# Patient Record
Sex: Male | Born: 1950 | Race: White | Hispanic: No | Marital: Married | Smoking: Never smoker
Health system: Southern US, Community
[De-identification: ages and names within clinical notes are randomized; demographics above are authoritative.]

## PROBLEM LIST (undated history)

## (undated) DIAGNOSIS — G4730 Sleep apnea, unspecified: Secondary | ICD-10-CM

## (undated) DIAGNOSIS — Z87442 Personal history of urinary calculi: Secondary | ICD-10-CM

## (undated) DIAGNOSIS — Q6211 Congenital occlusion of ureteropelvic junction: Secondary | ICD-10-CM

## (undated) DIAGNOSIS — F419 Anxiety disorder, unspecified: Secondary | ICD-10-CM

## (undated) DIAGNOSIS — E119 Type 2 diabetes mellitus without complications: Secondary | ICD-10-CM

## (undated) DIAGNOSIS — M1990 Unspecified osteoarthritis, unspecified site: Secondary | ICD-10-CM

## (undated) DIAGNOSIS — D751 Secondary polycythemia: Secondary | ICD-10-CM

## (undated) HISTORY — DX: Secondary polycythemia: D75.1

## (undated) HISTORY — DX: Unspecified osteoarthritis, unspecified site: M19.90

## (undated) HISTORY — PX: BLADDER STONE REMOVAL: SHX568

## (undated) HISTORY — PX: OTHER SURGICAL HISTORY: SHX169

---

## 2004-02-13 ENCOUNTER — Other Ambulatory Visit: Payer: Self-pay

## 2004-09-26 ENCOUNTER — Ambulatory Visit: Payer: Self-pay | Admitting: Internal Medicine

## 2004-10-27 ENCOUNTER — Ambulatory Visit: Payer: Self-pay | Admitting: Internal Medicine

## 2004-11-26 ENCOUNTER — Ambulatory Visit: Payer: Self-pay | Admitting: Internal Medicine

## 2004-12-27 ENCOUNTER — Ambulatory Visit: Payer: Self-pay | Admitting: Internal Medicine

## 2005-01-27 ENCOUNTER — Ambulatory Visit: Payer: Self-pay | Admitting: Internal Medicine

## 2005-02-24 ENCOUNTER — Ambulatory Visit: Payer: Self-pay | Admitting: Internal Medicine

## 2005-03-27 ENCOUNTER — Ambulatory Visit: Payer: Self-pay | Admitting: Internal Medicine

## 2005-04-26 ENCOUNTER — Ambulatory Visit: Payer: Self-pay | Admitting: Internal Medicine

## 2005-06-01 ENCOUNTER — Ambulatory Visit: Payer: Self-pay | Admitting: Internal Medicine

## 2005-06-26 ENCOUNTER — Ambulatory Visit: Payer: Self-pay | Admitting: Internal Medicine

## 2005-07-27 ENCOUNTER — Ambulatory Visit: Payer: Self-pay | Admitting: Internal Medicine

## 2005-08-27 ENCOUNTER — Ambulatory Visit: Payer: Self-pay | Admitting: Internal Medicine

## 2005-10-05 ENCOUNTER — Ambulatory Visit: Payer: Self-pay | Admitting: Internal Medicine

## 2005-10-27 ENCOUNTER — Ambulatory Visit: Payer: Self-pay | Admitting: Internal Medicine

## 2005-11-26 ENCOUNTER — Ambulatory Visit: Payer: Self-pay | Admitting: Internal Medicine

## 2005-12-28 ENCOUNTER — Ambulatory Visit: Payer: Self-pay | Admitting: Internal Medicine

## 2006-01-27 ENCOUNTER — Ambulatory Visit: Payer: Self-pay | Admitting: Internal Medicine

## 2006-02-24 ENCOUNTER — Ambulatory Visit: Payer: Self-pay | Admitting: Internal Medicine

## 2006-03-27 ENCOUNTER — Ambulatory Visit: Payer: Self-pay | Admitting: Internal Medicine

## 2006-06-14 ENCOUNTER — Ambulatory Visit: Payer: Self-pay | Admitting: Internal Medicine

## 2006-06-26 ENCOUNTER — Ambulatory Visit: Payer: Self-pay | Admitting: Internal Medicine

## 2006-07-27 ENCOUNTER — Ambulatory Visit: Payer: Self-pay | Admitting: Internal Medicine

## 2006-08-27 ENCOUNTER — Ambulatory Visit: Payer: Self-pay | Admitting: Internal Medicine

## 2006-10-04 ENCOUNTER — Ambulatory Visit: Payer: Self-pay | Admitting: Internal Medicine

## 2006-10-27 ENCOUNTER — Ambulatory Visit: Payer: Self-pay | Admitting: Internal Medicine

## 2006-11-29 ENCOUNTER — Ambulatory Visit: Payer: Self-pay | Admitting: Internal Medicine

## 2006-12-27 ENCOUNTER — Ambulatory Visit: Payer: Self-pay | Admitting: Internal Medicine

## 2007-01-27 ENCOUNTER — Ambulatory Visit: Payer: Self-pay | Admitting: Internal Medicine

## 2007-03-21 ENCOUNTER — Ambulatory Visit: Payer: Self-pay | Admitting: Internal Medicine

## 2007-03-22 ENCOUNTER — Ambulatory Visit: Payer: Self-pay | Admitting: Internal Medicine

## 2007-03-28 ENCOUNTER — Ambulatory Visit: Payer: Self-pay | Admitting: Internal Medicine

## 2007-04-27 ENCOUNTER — Ambulatory Visit: Payer: Self-pay | Admitting: Internal Medicine

## 2007-05-28 ENCOUNTER — Ambulatory Visit: Payer: Self-pay | Admitting: Internal Medicine

## 2007-06-27 ENCOUNTER — Ambulatory Visit: Payer: Self-pay | Admitting: Internal Medicine

## 2007-07-28 ENCOUNTER — Ambulatory Visit: Payer: Self-pay | Admitting: Internal Medicine

## 2007-08-28 ENCOUNTER — Ambulatory Visit: Payer: Self-pay | Admitting: Internal Medicine

## 2007-09-27 ENCOUNTER — Ambulatory Visit: Payer: Self-pay | Admitting: Internal Medicine

## 2007-10-28 ENCOUNTER — Ambulatory Visit: Payer: Self-pay | Admitting: Internal Medicine

## 2007-11-27 ENCOUNTER — Ambulatory Visit: Payer: Self-pay | Admitting: Internal Medicine

## 2007-12-28 ENCOUNTER — Ambulatory Visit: Payer: Self-pay | Admitting: Internal Medicine

## 2008-01-28 ENCOUNTER — Ambulatory Visit: Payer: Self-pay | Admitting: Internal Medicine

## 2008-02-25 ENCOUNTER — Ambulatory Visit: Payer: Self-pay | Admitting: Internal Medicine

## 2008-03-27 ENCOUNTER — Ambulatory Visit: Payer: Self-pay | Admitting: Internal Medicine

## 2008-04-26 ENCOUNTER — Ambulatory Visit: Payer: Self-pay | Admitting: Internal Medicine

## 2008-05-27 ENCOUNTER — Ambulatory Visit: Payer: Self-pay | Admitting: Internal Medicine

## 2008-06-26 ENCOUNTER — Ambulatory Visit: Payer: Self-pay | Admitting: Internal Medicine

## 2008-07-27 ENCOUNTER — Ambulatory Visit: Payer: Self-pay | Admitting: Internal Medicine

## 2008-09-03 ENCOUNTER — Ambulatory Visit: Payer: Self-pay | Admitting: Internal Medicine

## 2008-09-26 ENCOUNTER — Ambulatory Visit: Payer: Self-pay | Admitting: Internal Medicine

## 2008-10-29 ENCOUNTER — Ambulatory Visit: Payer: Self-pay | Admitting: Internal Medicine

## 2008-11-26 ENCOUNTER — Ambulatory Visit: Payer: Self-pay | Admitting: Internal Medicine

## 2008-12-27 ENCOUNTER — Ambulatory Visit: Payer: Self-pay | Admitting: Internal Medicine

## 2008-12-31 ENCOUNTER — Ambulatory Visit: Payer: Self-pay | Admitting: Internal Medicine

## 2009-01-27 ENCOUNTER — Ambulatory Visit: Payer: Self-pay | Admitting: Internal Medicine

## 2009-02-24 ENCOUNTER — Ambulatory Visit: Payer: Self-pay | Admitting: Internal Medicine

## 2009-04-15 ENCOUNTER — Ambulatory Visit: Payer: Self-pay | Admitting: Internal Medicine

## 2009-04-26 ENCOUNTER — Ambulatory Visit: Payer: Self-pay | Admitting: Internal Medicine

## 2009-06-10 ENCOUNTER — Ambulatory Visit: Payer: Self-pay | Admitting: Internal Medicine

## 2009-06-26 ENCOUNTER — Ambulatory Visit: Payer: Self-pay | Admitting: Internal Medicine

## 2009-08-05 ENCOUNTER — Ambulatory Visit: Payer: Self-pay | Admitting: Internal Medicine

## 2009-08-27 ENCOUNTER — Ambulatory Visit: Payer: Self-pay | Admitting: Internal Medicine

## 2009-09-26 ENCOUNTER — Ambulatory Visit: Payer: Self-pay | Admitting: Internal Medicine

## 2009-09-30 ENCOUNTER — Ambulatory Visit: Payer: Self-pay | Admitting: Internal Medicine

## 2009-10-27 ENCOUNTER — Ambulatory Visit: Payer: Self-pay | Admitting: Internal Medicine

## 2009-11-26 ENCOUNTER — Ambulatory Visit: Payer: Self-pay | Admitting: Internal Medicine

## 2010-01-20 ENCOUNTER — Ambulatory Visit: Payer: Self-pay | Admitting: Internal Medicine

## 2010-01-27 ENCOUNTER — Ambulatory Visit: Payer: Self-pay | Admitting: Internal Medicine

## 2010-03-17 ENCOUNTER — Ambulatory Visit: Payer: Self-pay | Admitting: Internal Medicine

## 2010-03-27 ENCOUNTER — Ambulatory Visit: Payer: Self-pay | Admitting: Internal Medicine

## 2010-04-26 ENCOUNTER — Ambulatory Visit: Payer: Self-pay | Admitting: Internal Medicine

## 2010-05-12 ENCOUNTER — Ambulatory Visit: Payer: Self-pay | Admitting: Internal Medicine

## 2010-05-27 ENCOUNTER — Ambulatory Visit: Payer: Self-pay | Admitting: Internal Medicine

## 2010-07-07 ENCOUNTER — Ambulatory Visit: Payer: Self-pay | Admitting: Internal Medicine

## 2010-07-27 ENCOUNTER — Ambulatory Visit: Payer: Self-pay | Admitting: Internal Medicine

## 2010-08-20 ENCOUNTER — Ambulatory Visit: Payer: Self-pay | Admitting: Urology

## 2010-09-08 ENCOUNTER — Ambulatory Visit: Payer: Self-pay | Admitting: Internal Medicine

## 2010-09-14 ENCOUNTER — Ambulatory Visit: Payer: Self-pay | Admitting: Urology

## 2010-09-26 ENCOUNTER — Ambulatory Visit: Payer: Self-pay | Admitting: Internal Medicine

## 2010-11-03 ENCOUNTER — Ambulatory Visit: Payer: Self-pay | Admitting: Internal Medicine

## 2010-11-26 ENCOUNTER — Ambulatory Visit: Payer: Self-pay | Admitting: Internal Medicine

## 2011-01-01 ENCOUNTER — Ambulatory Visit: Payer: Self-pay | Admitting: Internal Medicine

## 2011-01-27 ENCOUNTER — Ambulatory Visit: Payer: Self-pay | Admitting: Internal Medicine

## 2011-02-26 ENCOUNTER — Ambulatory Visit: Payer: Self-pay | Admitting: Internal Medicine

## 2011-03-28 ENCOUNTER — Ambulatory Visit: Payer: Self-pay | Admitting: Internal Medicine

## 2011-04-27 ENCOUNTER — Ambulatory Visit: Payer: Self-pay | Admitting: Internal Medicine

## 2011-06-18 ENCOUNTER — Ambulatory Visit: Payer: Self-pay | Admitting: Internal Medicine

## 2011-06-27 ENCOUNTER — Ambulatory Visit: Payer: Self-pay | Admitting: Internal Medicine

## 2011-09-03 ENCOUNTER — Ambulatory Visit: Payer: Self-pay | Admitting: Internal Medicine

## 2011-09-27 ENCOUNTER — Ambulatory Visit: Payer: Self-pay | Admitting: Internal Medicine

## 2011-10-29 ENCOUNTER — Ambulatory Visit: Payer: Self-pay | Admitting: Internal Medicine

## 2011-11-27 ENCOUNTER — Ambulatory Visit: Payer: Self-pay | Admitting: Internal Medicine

## 2011-12-28 ENCOUNTER — Ambulatory Visit: Payer: Self-pay | Admitting: Internal Medicine

## 2012-02-21 ENCOUNTER — Ambulatory Visit: Payer: Self-pay | Admitting: Internal Medicine

## 2012-02-21 LAB — FERRITIN: Ferritin (ARMC): 20 ng/mL (ref 8–388)

## 2012-02-21 LAB — IRON AND TIBC
Iron Saturation: 12 %
Iron: 53 ug/dL — ABNORMAL LOW (ref 65–175)

## 2012-02-21 LAB — CANCER CENTER HEMATOCRIT: HCT: 49.6 % (ref 40.0–52.0)

## 2012-02-25 ENCOUNTER — Ambulatory Visit: Payer: Self-pay | Admitting: Internal Medicine

## 2012-04-17 ENCOUNTER — Ambulatory Visit: Payer: Self-pay | Admitting: Internal Medicine

## 2012-04-17 LAB — CANCER CENTER HEMATOCRIT: HCT: 48.8 % (ref 40.0–52.0)

## 2012-04-26 ENCOUNTER — Ambulatory Visit: Payer: Self-pay | Admitting: Internal Medicine

## 2012-06-07 ENCOUNTER — Ambulatory Visit: Payer: Self-pay | Admitting: Internal Medicine

## 2012-06-07 LAB — CBC CANCER CENTER
Basophil %: 0.5 %
HCT: 48.5 % (ref 40.0–52.0)
HGB: 15.6 g/dL (ref 13.0–18.0)
MCH: 24.2 pg — ABNORMAL LOW (ref 26.0–34.0)
MCV: 76 fL — ABNORMAL LOW (ref 80–100)
Monocyte #: 0.8 x10 3/mm (ref 0.2–1.0)
Neutrophil #: 5.5 x10 3/mm (ref 1.4–6.5)
RBC: 6.43 10*6/uL — ABNORMAL HIGH (ref 4.40–5.90)

## 2012-06-26 ENCOUNTER — Ambulatory Visit: Payer: Self-pay | Admitting: Internal Medicine

## 2012-08-02 ENCOUNTER — Ambulatory Visit: Payer: Self-pay | Admitting: Internal Medicine

## 2012-08-02 LAB — CBC CANCER CENTER
Basophil #: 0.1 x10 3/mm (ref 0.0–0.1)
Eosinophil #: 0.1 x10 3/mm (ref 0.0–0.7)
Eosinophil %: 1.2 %
HCT: 46.8 % (ref 40.0–52.0)
Lymphocyte #: 3 x10 3/mm (ref 1.0–3.6)
Lymphocyte %: 29.6 %
MCH: 23.7 pg — ABNORMAL LOW (ref 26.0–34.0)
MCHC: 31.7 g/dL — ABNORMAL LOW (ref 32.0–36.0)
MCV: 75 fL — ABNORMAL LOW (ref 80–100)
Monocyte %: 8.5 %
Neutrophil #: 6 x10 3/mm (ref 1.4–6.5)
Neutrophil %: 60.2 %
RBC: 6.26 10*6/uL — ABNORMAL HIGH (ref 4.40–5.90)
RDW: 15.9 % — ABNORMAL HIGH (ref 11.5–14.5)
WBC: 10 x10 3/mm (ref 3.8–10.6)

## 2012-08-16 ENCOUNTER — Ambulatory Visit: Payer: Self-pay | Admitting: Urology

## 2012-08-27 ENCOUNTER — Ambulatory Visit: Payer: Self-pay | Admitting: Internal Medicine

## 2012-08-29 ENCOUNTER — Ambulatory Visit: Payer: Self-pay | Admitting: Urology

## 2012-08-29 LAB — CREATININE, SERUM: EGFR (African American): >60

## 2012-09-27 ENCOUNTER — Ambulatory Visit: Payer: Self-pay | Admitting: Internal Medicine

## 2012-09-27 LAB — CANCER CENTER HEMATOCRIT: HCT: 48.6 % (ref 40.0–52.0)

## 2012-10-09 DIAGNOSIS — N1330 Unspecified hydronephrosis: Secondary | ICD-10-CM | POA: Insufficient documentation

## 2012-10-09 DIAGNOSIS — E291 Testicular hypofunction: Secondary | ICD-10-CM | POA: Insufficient documentation

## 2012-10-09 DIAGNOSIS — N401 Enlarged prostate with lower urinary tract symptoms: Secondary | ICD-10-CM | POA: Insufficient documentation

## 2012-10-09 DIAGNOSIS — R3129 Other microscopic hematuria: Secondary | ICD-10-CM | POA: Insufficient documentation

## 2012-10-09 DIAGNOSIS — N529 Male erectile dysfunction, unspecified: Secondary | ICD-10-CM | POA: Insufficient documentation

## 2012-10-12 ENCOUNTER — Ambulatory Visit: Payer: Self-pay | Admitting: Urology

## 2012-10-13 LAB — URINE CULTURE

## 2012-10-25 ENCOUNTER — Ambulatory Visit: Payer: Self-pay | Admitting: Urology

## 2012-10-27 ENCOUNTER — Ambulatory Visit: Payer: Self-pay | Admitting: Internal Medicine

## 2012-11-21 LAB — CANCER CENTER HEMATOCRIT: HCT: 48.5 % (ref 40.0–52.0)

## 2012-11-26 ENCOUNTER — Ambulatory Visit: Payer: Self-pay | Admitting: Internal Medicine

## 2012-12-27 ENCOUNTER — Ambulatory Visit: Payer: Self-pay | Admitting: Internal Medicine

## 2013-01-17 LAB — CANCER CENTER HEMATOCRIT: HCT: 48.9 % (ref 40.0–52.0)

## 2013-01-17 LAB — IRON AND TIBC
Iron Saturation: 10 %
Iron: 47 ug/dL — ABNORMAL LOW (ref 65–175)
Unbound Iron-Bind.Cap.: 411 ug/dL

## 2013-01-17 LAB — FERRITIN: Ferritin (ARMC): 19 ng/mL (ref 8–388)

## 2013-01-27 ENCOUNTER — Ambulatory Visit: Payer: Self-pay | Admitting: Internal Medicine

## 2013-03-20 ENCOUNTER — Ambulatory Visit: Payer: Self-pay | Admitting: Internal Medicine

## 2013-03-21 LAB — CBC CANCER CENTER
Basophil #: 0.1 x10 3/mm (ref 0.0–0.1)
Basophil %: 0.7 %
Eosinophil #: 0.2 x10 3/mm (ref 0.0–0.7)
Eosinophil %: 2 %
HCT: 49.4 % (ref 40.0–52.0)
HGB: 16.1 g/dL (ref 13.0–18.0)
Lymphocyte #: 3.3 x10 3/mm (ref 1.0–3.6)
Lymphocyte %: 34.6 %
MCHC: 32.6 g/dL (ref 32.0–36.0)
MCV: 75 fL — ABNORMAL LOW (ref 80–100)
Monocyte #: 0.8 x10 3/mm (ref 0.2–1.0)
Neutrophil #: 5.2 x10 3/mm (ref 1.4–6.5)
Neutrophil %: 54 %
Platelet: 251 x10 3/mm (ref 150–440)
RBC: 6.55 10*6/uL — ABNORMAL HIGH (ref 4.40–5.90)
RDW: 15.6 % — ABNORMAL HIGH (ref 11.5–14.5)

## 2013-03-27 ENCOUNTER — Ambulatory Visit: Payer: Self-pay | Admitting: Internal Medicine

## 2013-05-15 ENCOUNTER — Ambulatory Visit: Payer: Self-pay | Admitting: Internal Medicine

## 2013-05-16 LAB — CANCER CENTER HEMATOCRIT: HCT: 47.5 % (ref 40.0–52.0)

## 2013-05-27 ENCOUNTER — Ambulatory Visit: Payer: Self-pay | Admitting: Internal Medicine

## 2013-07-11 ENCOUNTER — Ambulatory Visit: Payer: Self-pay | Admitting: Internal Medicine

## 2013-07-27 ENCOUNTER — Ambulatory Visit: Payer: Self-pay | Admitting: Internal Medicine

## 2013-09-05 ENCOUNTER — Ambulatory Visit: Payer: Self-pay | Admitting: Internal Medicine

## 2013-09-05 LAB — IRON AND TIBC
Iron Bind.Cap.(Total): 475 ug/dL — ABNORMAL HIGH (ref 250–450)
Iron Saturation: 14 %
Iron: 66 ug/dL (ref 65–175)

## 2013-09-05 LAB — FERRITIN: Ferritin (ARMC): 22 ng/mL (ref 8–388)

## 2013-09-05 LAB — CANCER CENTER HEMATOCRIT: HCT: 48.7 % (ref 40.0–52.0)

## 2013-09-26 ENCOUNTER — Ambulatory Visit: Payer: Self-pay | Admitting: Internal Medicine

## 2013-10-30 ENCOUNTER — Ambulatory Visit: Payer: Self-pay | Admitting: Internal Medicine

## 2013-10-30 LAB — CBC CANCER CENTER
Basophil #: 0.1 x10 3/mm (ref 0.0–0.1)
Basophil %: 0.7 %
Eosinophil #: 0.2 x10 3/mm (ref 0.0–0.7)
Lymphocyte #: 3.6 x10 3/mm (ref 1.0–3.6)
MCH: 24.3 pg — ABNORMAL LOW (ref 26.0–34.0)
Monocyte #: 0.9 x10 3/mm (ref 0.2–1.0)
Monocyte %: 8.6 %
Neutrophil #: 5.4 x10 3/mm (ref 1.4–6.5)
Neutrophil %: 53.4 %
Platelet: 277 x10 3/mm (ref 150–440)
RDW: 15.9 % — ABNORMAL HIGH (ref 11.5–14.5)
WBC: 10.2 x10 3/mm (ref 3.8–10.6)

## 2013-11-26 ENCOUNTER — Ambulatory Visit: Payer: Self-pay | Admitting: Internal Medicine

## 2013-12-27 ENCOUNTER — Ambulatory Visit: Payer: Self-pay | Admitting: Internal Medicine

## 2014-02-18 ENCOUNTER — Ambulatory Visit: Payer: Self-pay | Admitting: Internal Medicine

## 2014-02-19 LAB — CANCER CENTER HEMATOCRIT: HCT: 47.3 % (ref 40.0–52.0)

## 2014-02-24 ENCOUNTER — Ambulatory Visit: Payer: Self-pay | Admitting: Internal Medicine

## 2014-04-15 ENCOUNTER — Ambulatory Visit: Payer: Self-pay | Admitting: Internal Medicine

## 2014-04-23 LAB — IRON AND TIBC
IRON SATURATION: 10 %
Iron Bind.Cap.(Total): 463 ug/dL — ABNORMAL HIGH (ref 250–450)
Iron: 47 ug/dL — ABNORMAL LOW (ref 65–175)
Unbound Iron-Bind.Cap.: 416 ug/dL

## 2014-04-23 LAB — CANCER CENTER HEMATOCRIT: HCT: 48.3 % (ref 40.0–52.0)

## 2014-04-23 LAB — FERRITIN: FERRITIN (ARMC): 14 ng/mL (ref 8–388)

## 2014-04-26 ENCOUNTER — Ambulatory Visit: Payer: Self-pay | Admitting: Internal Medicine

## 2014-06-18 ENCOUNTER — Ambulatory Visit: Payer: Self-pay | Admitting: Internal Medicine

## 2014-06-18 LAB — CBC CANCER CENTER
BASOS ABS: 0.1 x10 3/mm (ref 0.0–0.1)
BASOS PCT: 1 %
EOS ABS: 0.2 x10 3/mm (ref 0.0–0.7)
EOS PCT: 1.9 %
HCT: 47.8 % (ref 40.0–52.0)
HGB: 15.2 g/dL (ref 13.0–18.0)
LYMPHS ABS: 2.8 x10 3/mm (ref 1.0–3.6)
LYMPHS PCT: 34.4 %
MCH: 23.8 pg — AB (ref 26.0–34.0)
MCHC: 31.8 g/dL — ABNORMAL LOW (ref 32.0–36.0)
MCV: 75 fL — ABNORMAL LOW (ref 80–100)
MONO ABS: 0.7 x10 3/mm (ref 0.2–1.0)
Monocyte %: 8.4 %
NEUTROS ABS: 4.4 x10 3/mm (ref 1.4–6.5)
Neutrophil %: 54.3 %
Platelet: 260 x10 3/mm (ref 150–440)
RBC: 6.4 10*6/uL — ABNORMAL HIGH (ref 4.40–5.90)
RDW: 16.4 % — AB (ref 11.5–14.5)
WBC: 8.1 x10 3/mm (ref 3.8–10.6)

## 2014-06-26 ENCOUNTER — Ambulatory Visit: Payer: Self-pay | Admitting: Internal Medicine

## 2014-08-13 ENCOUNTER — Ambulatory Visit: Payer: Self-pay | Admitting: Internal Medicine

## 2014-08-13 LAB — CANCER CENTER HEMATOCRIT: HCT: 48.9 % (ref 40.0–52.0)

## 2014-08-27 ENCOUNTER — Ambulatory Visit: Payer: Self-pay | Admitting: Internal Medicine

## 2014-10-08 ENCOUNTER — Ambulatory Visit: Payer: Self-pay | Admitting: Internal Medicine

## 2014-10-08 LAB — CANCER CENTER HEMATOCRIT: HCT: 49.2 % (ref 40.0–52.0)

## 2014-10-27 ENCOUNTER — Ambulatory Visit: Payer: Self-pay | Admitting: Internal Medicine

## 2014-12-03 ENCOUNTER — Ambulatory Visit: Payer: Self-pay | Admitting: Internal Medicine

## 2014-12-03 LAB — IRON AND TIBC
IRON: 42 ug/dL — AB (ref 65–175)
Iron Bind.Cap.(Total): 449 ug/dL (ref 250–450)
Iron Saturation: 9 %
Unbound Iron-Bind.Cap.: 407 ug/dL

## 2014-12-03 LAB — FERRITIN: Ferritin (ARMC): 15 ng/mL (ref 8–388)

## 2014-12-03 LAB — CANCER CENTER HEMATOCRIT: HCT: 47.4 % (ref 40.0–52.0)

## 2014-12-27 ENCOUNTER — Ambulatory Visit: Payer: Self-pay | Admitting: Internal Medicine

## 2015-01-28 ENCOUNTER — Ambulatory Visit: Payer: Self-pay | Admitting: Internal Medicine

## 2015-01-28 LAB — CBC CANCER CENTER
BASOS PCT: 0.6 %
Basophil #: 0.1 x10 3/mm (ref 0.0–0.1)
Eosinophil #: 0.3 x10 3/mm (ref 0.0–0.7)
Eosinophil %: 2.1 %
HCT: 47 % (ref 40.0–52.0)
HGB: 15 g/dL (ref 13.0–18.0)
LYMPHS PCT: 29.3 %
Lymphocyte #: 3.6 x10 3/mm (ref 1.0–3.6)
MCH: 23.4 pg — AB (ref 26.0–34.0)
MCHC: 31.9 g/dL — ABNORMAL LOW (ref 32.0–36.0)
MCV: 74 fL — ABNORMAL LOW (ref 80–100)
MONOS PCT: 8.4 %
Monocyte #: 1 x10 3/mm (ref 0.2–1.0)
NEUTROS PCT: 59.6 %
Neutrophil #: 7.3 x10 3/mm — ABNORMAL HIGH (ref 1.4–6.5)
Platelet: 339 x10 3/mm (ref 150–440)
RBC: 6.4 10*6/uL — ABNORMAL HIGH (ref 4.40–5.90)
RDW: 16.3 % — ABNORMAL HIGH (ref 11.5–14.5)
WBC: 12.2 x10 3/mm — ABNORMAL HIGH (ref 3.8–10.6)

## 2015-02-25 ENCOUNTER — Ambulatory Visit
Admit: 2015-02-25 | Disposition: A | Discharge status: Home / Self Care | Payer: Self-pay | Attending: Internal Medicine | Admitting: Internal Medicine

## 2015-04-01 ENCOUNTER — Ambulatory Visit
Admit: 2015-04-01 | Disposition: A | Discharge status: Home / Self Care | Payer: Self-pay | Attending: Internal Medicine | Admitting: Internal Medicine

## 2015-04-01 LAB — CANCER CENTER HEMATOCRIT: HCT: 46.5 % (ref 40.0–52.0)

## 2015-04-15 NOTE — Op Note (Signed)
PATIENT NAME:  Benjamin [name], Benjamin [name] MR#:  [id number] DATE OF BIRTH:  1951-09-23  DATE OF PROCEDURE:  10/25/2012  PREOPERATIVE DIAGNOSES:  1. Benign prostatic hypertrophy with bladder outlet obstruction.  2. Bladder calculi.   POSTOPERATIVE DIAGNOSES: 1. Benign prostatic hypertrophy with bladder outlet obstruction.  2. Bladder calculi.   PROCEDURE: Photoselective vaporization of the prostate.   SURGEON: Rosabell Geyer C. Bernardo Heater, MD   ASSISTANT: None.   ANESTHETIC: General.   INDICATIONS: The patient is [name] 64 year old male recently found to have microscopic hematuria. He has moderate to severe lower urinary tract symptoms. [name] CT urogram was remarkable for multiple small bladder calculi and prostatic enlargement. No upper tract abnormalities were identified. Cystoscopy shows no bladder tumor and multiple bladder calculi with prostate enlargement. After discussion of treatment options, he has elected PVP and cystolitholapaxy.   DESCRIPTION OF PROCEDURE: The patient was taken to the operating room where [name] general anesthetic was administered. He was placed in the low lithotomy position and his external genitalia were prepped and draped in the usual fashion. Time-out was performed. [name] 22 French continuous flow laser cystoscope was lubricated and passed under direct vision. The urethra was normal in caliber without stricture. Prostate was remarkable for lateral lobes which were slightly enlarged, however, there was marked bladder neck elevation and [name] small median lobe. The bladder was moderately trabeculated. There were multiple calculi within the bladder. No bladder mucosal lesions were noted. The ureteral orifices were well away from the elevated bladder neck. [name] KTP laser fiber was placed through the cystoscope. Using the [facility] XPS generator, the bladder neck was vaporized starting at [name] power setting of 80 watts and increased to 120 W. This vaporization was carried back toward the verumontanum. The lateral  lobes were then sequentially vaporized working from the bladder neck toward the verumontanum. Hemostasis was obtained with laser coagulation. Once vaporization was complete, the channel was opened with the scope placed at the verumontanum. Hemostasis was adequate. Cystoscope was placed in the bladder. All of the multiple bladder calculi were able to be removed via irrigation and cystolitholapaxy was not required. At the completion of the procedure hemostasis was adequate. The cystoscope was removed. [name] 20 French Foley catheter was placed with the aid of [name] catheter guide. The effluent was clear when irrigated. [name] B and O suppository was placed per rectum. He was taken to the PAC-U in stable condition. There were no complications. EBL minimal.   ____________________________ Ronda Fairly. Bernardo Heater, MD scs:drc D: 10/25/2012 13:45:22 ET T: 10/25/2012 13:58:32 ET JOB#: [id number]  cc: Nicki Reaper C. Bernardo Heater, MD, <Dictator> [name] MD ELECTRONICALLY SIGNED 10/26/2012 12:23

## 2015-05-18 ENCOUNTER — Encounter: Payer: Self-pay | Admitting: *Deleted

## 2015-05-18 ENCOUNTER — Other Ambulatory Visit: Payer: Self-pay | Admitting: *Deleted

## 2015-05-18 DIAGNOSIS — D751 Secondary polycythemia: Secondary | ICD-10-CM | POA: Insufficient documentation

## 2015-05-18 HISTORY — DX: Secondary polycythemia: D75.1

## 2015-05-20 ENCOUNTER — Inpatient Hospital Stay: Payer: No Typology Code available for payment source | Attending: Internal Medicine

## 2015-05-20 ENCOUNTER — Inpatient Hospital Stay: Payer: No Typology Code available for payment source

## 2015-05-20 VITALS — BP 132/80 | HR 90

## 2015-05-20 DIAGNOSIS — D751 Secondary polycythemia: Secondary | ICD-10-CM

## 2015-05-20 LAB — CBC WITH DIFFERENTIAL/PLATELET
BASOS PCT: 1 %
Basophils Absolute: 0.1 10*3/uL (ref 0–0.1)
Eosinophils Absolute: 0.2 10*3/uL (ref 0–0.7)
Eosinophils Relative: 2 %
HCT: 50.1 % (ref 40.0–52.0)
HEMOGLOBIN: 15.9 g/dL (ref 13.0–18.0)
LYMPHS ABS: 3.2 10*3/uL (ref 1.0–3.6)
Lymphocytes Relative: 31 %
MCH: 23.4 pg — ABNORMAL LOW (ref 26.0–34.0)
MCHC: 31.8 g/dL — AB (ref 32.0–36.0)
MCV: 73.5 fL — ABNORMAL LOW (ref 80.0–100.0)
MONO ABS: 0.9 10*3/uL (ref 0.2–1.0)
MONOS PCT: 9 %
NEUTROS ABS: 6 10*3/uL (ref 1.4–6.5)
NEUTROS PCT: 57 %
Platelets: 290 10*3/uL (ref 150–440)
RBC: 6.81 MIL/uL — ABNORMAL HIGH (ref 4.40–5.90)
RDW: 17.1 % — ABNORMAL HIGH (ref 11.5–14.5)
WBC: 10.3 10*3/uL (ref 3.8–10.6)

## 2015-06-02 ENCOUNTER — Encounter
Admission: RE | Disposition: A | Discharge status: Home / Self Care | Payer: Self-pay | Source: Ambulatory Visit | Attending: Gastroenterology

## 2015-06-02 ENCOUNTER — Ambulatory Visit
Admission: RE | Admit: 2015-06-02 | Discharge: 2015-06-02 | Disposition: A | Discharge status: Home / Self Care | Payer: No Typology Code available for payment source | Source: Ambulatory Visit | Attending: Gastroenterology | Admitting: Gastroenterology

## 2015-06-02 ENCOUNTER — Ambulatory Visit: Payer: No Typology Code available for payment source | Admitting: Anesthesiology

## 2015-06-02 ENCOUNTER — Encounter: Payer: Self-pay | Admitting: Anesthesiology

## 2015-06-02 DIAGNOSIS — E119 Type 2 diabetes mellitus without complications: Secondary | ICD-10-CM | POA: Diagnosis not present

## 2015-06-02 DIAGNOSIS — F419 Anxiety disorder, unspecified: Secondary | ICD-10-CM | POA: Diagnosis not present

## 2015-06-02 DIAGNOSIS — Z888 Allergy status to other drugs, medicaments and biological substances status: Secondary | ICD-10-CM | POA: Insufficient documentation

## 2015-06-02 DIAGNOSIS — D120 Benign neoplasm of cecum: Secondary | ICD-10-CM | POA: Insufficient documentation

## 2015-06-02 DIAGNOSIS — D751 Secondary polycythemia: Secondary | ICD-10-CM | POA: Diagnosis not present

## 2015-06-02 DIAGNOSIS — G4730 Sleep apnea, unspecified: Secondary | ICD-10-CM | POA: Insufficient documentation

## 2015-06-02 DIAGNOSIS — Z1211 Encounter for screening for malignant neoplasm of colon: Secondary | ICD-10-CM | POA: Insufficient documentation

## 2015-06-02 DIAGNOSIS — Z79891 Long term (current) use of opiate analgesic: Secondary | ICD-10-CM | POA: Insufficient documentation

## 2015-06-02 DIAGNOSIS — E785 Hyperlipidemia, unspecified: Secondary | ICD-10-CM | POA: Insufficient documentation

## 2015-06-02 DIAGNOSIS — Z79899 Other long term (current) drug therapy: Secondary | ICD-10-CM | POA: Diagnosis not present

## 2015-06-02 HISTORY — DX: Sleep apnea, unspecified: G47.30

## 2015-06-02 HISTORY — DX: Anxiety disorder, unspecified: F41.9

## 2015-06-02 HISTORY — PX: COLONOSCOPY: SHX5424

## 2015-06-02 HISTORY — DX: Type 2 diabetes mellitus without complications: E11.9

## 2015-06-02 HISTORY — DX: Secondary polycythemia: D75.1

## 2015-06-02 LAB — GLUCOSE, CAPILLARY: GLUCOSE-CAPILLARY: 167 mg/dL — AB (ref 65–99)

## 2015-06-02 SURGERY — COLONOSCOPY
Anesthesia: General

## 2015-06-02 MED ORDER — LIDOCAINE HCL (CARDIAC) 20 MG/ML IV SOLN
INTRAVENOUS | Status: DC | PRN
  Administered 2015-06-02: 60 mg via INTRAVENOUS

## 2015-06-02 MED ORDER — SODIUM CHLORIDE 0.9 % IV SOLN
INTRAVENOUS | Status: DC
  Administered 2015-06-02: 08:00:00 via INTRAVENOUS

## 2015-06-02 MED ORDER — PROPOFOL 10 MG/ML IV BOLUS
INTRAVENOUS | Status: DC | PRN
Start: 2015-06-02 — End: 2015-06-02
  Administered 2015-06-02: 50 mg via INTRAVENOUS
  Administered 2015-06-02: 20 mg via INTRAVENOUS

## 2015-06-02 MED ORDER — PROPOFOL INFUSION 10 MG/ML OPTIME
INTRAVENOUS | Status: DC | PRN
Start: 2015-06-02 — End: 2015-06-02
  Administered 2015-06-02: 140 ug/kg/min via INTRAVENOUS

## 2015-06-02 MED ORDER — SODIUM CHLORIDE 0.9 % IV SOLN: INTRAVENOUS | Status: DC

## 2015-06-02 NOTE — Anesthesia Postprocedure Evaluation (Signed)
  Anesthesia Post-op Note  Patient: Benjamin [name]  Procedure(s) Performed: Procedure(s): COLONOSCOPY (N/A)  Anesthesia type:General  Patient location: PACU  Post pain: Pain level controlled  Post assessment: Post-op Vital signs reviewed, Patient's Cardiovascular Status Stable, Respiratory Function Stable, Patent Airway and No signs of Nausea or vomiting  Post vital signs: Reviewed and stable  Last Vitals:  Filed Vitals:   06/02/15 0920  BP: 122/85  Pulse: 89  Temp:   Resp: 18    Level of consciousness: awake, alert  and patient cooperative  Complications: No apparent anesthesia complications

## 2015-06-02 NOTE — Anesthesia Preprocedure Evaluation (Addendum)
Anesthesia Evaluation  Patient identified by MRN, date of birth, ID band Patient awake    Reviewed: Allergy & Precautions, NPO status , Patient's Chart, lab work & pertinent test results, reviewed documented beta blocker date and time   Airway Mallampati: III  TM Distance: >3 FB     Dental  (+) Chipped, Missing   Pulmonary sleep apnea and Continuous Positive Airway Pressure Ventilation ,          Cardiovascular     Neuro/Psych    GI/Hepatic   Endo/Other  diabetes, Well Controlled, Type 2  Renal/GU      Musculoskeletal   Abdominal   Peds  Hematology   Anesthesia Other Findings   Reproductive/Obstetrics                            Anesthesia Physical Anesthesia Plan  ASA: III  Anesthesia Plan: General   Post-op Pain Management:    Induction: Intravenous  Airway Management Planned: Nasal Cannula  Additional Equipment:   Intra-op Plan:   Post-operative Plan:   Informed Consent:   Plan Discussed with: CRNA  Anesthesia Plan Comments:         Anesthesia Quick Evaluation

## 2015-06-02 NOTE — Discharge Instructions (Signed)

## 2015-06-02 NOTE — Op Note (Signed)
[facility] Patient Name: Benjamin [name] Procedure Date: 06/02/2015 8:13 AM MRN: [id number] Account #: [id number] Date of Birth: 22-Jan-1951 Admit Type: Outpatient Age: 64 Room: [facility] ENDO ROOM 2 Gender: Male Note Status: Finalized Procedure:         Colonoscopy Indications:       Screening for colorectal malignant neoplasm, Last                     colonoscopy: date unknown (unable to locate last                     colonoscopy report) Patient Profile:   This is a 64 year old male. Providers:         Gerrit Heck. Rayann Heman, MD Referring MD:      Irven Easterly. Kary Kos, MD (Referring MD) Medicines:         Propofol per Anesthesia Complications:     No immediate complications. Procedure:         Pre-Anesthesia Assessment:                    - Prior to the procedure, a History and Physical was                     performed, and patient medications and allergies were                     reviewed. The patient is competent. The risks and benefits                     of the procedure and the sedation options and risks were                     discussed with the patient. All questions were answered                     and informed consent was obtained. Patient identification                     and proposed procedure were verified by the physician and                     the nurse in the pre-procedure area. Mental Status                     Examination: alert and oriented. Airway Examination:                     normal oropharyngeal airway and neck mobility. Respiratory                     Examination: clear to auscultation. CV Examination: RRR,                     no murmurs, no S3 or S4. Prophylactic Antibiotics: The                     patient does not require prophylactic antibiotics. Prior                     Anticoagulants: The patient has taken no previous                     anticoagulant or antiplatelet agents. ASA Grade  Assessment: III - A  patient with severe systemic disease.                     After reviewing the risks and benefits, the patient was                     deemed in satisfactory condition to undergo the procedure.                     The anesthesia plan was to use monitored anesthesia care                     (MAC). Immediately prior to administration of medications,                     the patient was re-assessed for adequacy to receive                     sedatives. The heart rate, respiratory rate, oxygen                     saturations, blood pressure, adequacy of pulmonary                     ventilation, and response to care were monitored                     throughout the procedure. The physical status of the                     patient was re-assessed after the procedure.                    - Prior to the procedure, a History and Physical was                     performed, and patient medications, allergies and                     sensitivities were reviewed. The patient's tolerance of                     previous anesthesia was reviewed.                    After obtaining informed consent, the colonoscope was                     passed under direct vision. Throughout the procedure, the                     patient's blood pressure, pulse, and oxygen saturations                     were monitored continuously. The Colonoscope was                     introduced through the anus and advanced to the the cecum,                     identified by appendiceal orifice and ileocecal valve. The                     colonoscopy was performed without difficulty. The patient  tolerated the procedure well. The quality of the bowel                     preparation was good except the ascending colon was poor. Findings:      The perianal and digital rectal examinations were normal.      A 20 mm polyp was found in the cecum. The polyp was sessile. The polyp       was removed with a saline  injection-lift technique using a hot snare       followed by biopsy forceps around edge. The polyp was removed with a       piecemeal technique using a hot snare. Resection and retrieval were       complete. To prevent bleeding after the polypectomy, one hemostatic clip       was successfully placed (MRI compatible).      A 6 mm polyp was found in the cecum. The polyp was sessile. The polyp       was removed with a hot snare. Resection and retrieval were complete.      The exam was otherwise without abnormality on direct and retroflexion       views. Impression:        - One 20 mm polyp in the cecum. Resected and retrieved.                     Clip was placed.                    - One 6 mm polyp in the cecum. Resected and retrieved.                    - The examination was otherwise normal on direct and                     retroflexion views. Recommendation:    - Observe patient in GI recovery unit.                    - High fiber diet.                    - Continue present medications.                    - Await pathology results.                    - Repeat colonoscopy in 6 months for surveillance after                     piecemeal polypectomy. Need 48 hours of clear liquids,                     trilyte prep ( poor prep this time)                    - Return to referring physician.                    - The findings and recommendations were discussed with the                     patient.                    - The findings and recommendations were discussed with the  patient's family. Procedure Code(s): --- Professional ---                    [phone], 47, Colonoscopy, flexible; with endoscopic mucosal                     resection                    [phone], Colonoscopy, flexible; with removal of tumor(s),                     polyp(s), or other lesion(s) by snare technique CPT copyright 2014 American Medical Association. All rights reserved. The codes documented in this  report are preliminary and upon coder review may  be revised to meet current compliance requirements. Mellody Life, MD 06/02/2015 8:51:20 AM This report has been signed electronically. Number of Addenda: 0 Note Initiated On: 06/02/2015 8:13 AM Scope Withdrawal Time: 0 hours 23 minutes 31 seconds  Total Procedure Duration: 0 hours 27 minutes 3 seconds       [facility]

## 2015-06-02 NOTE — Transfer of Care (Signed)
Immediate Anesthesia Transfer of Care Note  Patient: Benjamin [name]  Procedure(s) Performed: Procedure(s): COLONOSCOPY (N/A)  Patient Location: endoscopy unit  Anesthesia Type:General  Level of Consciousness: awake, alert , oriented and patient cooperative  Airway & Oxygen Therapy: Patient Spontanous Breathing and Patient connected to nasal cannula oxygen  Post-op Assessment: Report given to RN, Post -op Vital signs reviewed and stable and Patient moving all extremities X 4  Post vital signs: Reviewed and stable  Last Vitals:  Filed Vitals:   06/02/15 0855  BP: 118/82  Pulse: 91  Temp: 36.4 C  Resp: 12    Complications: No apparent anesthesia complications

## 2015-06-02 NOTE — H&P (Signed)
  Primary Care Physician:  Maryland Pink, MD  Pre-Procedure History & Physical: HPI:  Benjamin [name] is a 64 y.o. male is here for an colonoscopy.   Past Medical History  Diagnosis Date  . Erythrocytosis 05/18/2015  . Diabetes mellitus without complication   . Anxiety   . Sleep apnea   . Polycythemia     Past Surgical History  Procedure Laterality Date  . Cataracts      Prior to Admission medications   Medication Sig Start Date End Date Taking? Authorizing Provider  acetaminophen (TYLENOL) 500 MG tablet Take 1,000 mg by mouth every 6 (six) hours as needed for mild pain.   Yes Historical Provider, MD  canagliflozin (INVOKANA) 100 MG TABS tablet Take 100 mg by mouth daily.   Yes Historical Provider, MD  cetirizine (ZYRTEC) 10 MG tablet Take 10 mg by mouth daily as needed for allergies.   Yes Historical Provider, MD  glipiZIDE (GLUCOTROL) 5 MG tablet Take 5 mg by mouth 2 (two) times daily.   Yes Historical Provider, MD  HYDROcodone-acetaminophen (NORCO/VICODIN) 5-325 MG per tablet Take 1 tablet by mouth every 6 (six) hours as needed for moderate pain.   Yes Historical Provider, MD  LORazepam (ATIVAN) 1 MG tablet Take 1 mg by mouth every 8 (eight) hours as needed for anxiety.   Yes Historical Provider, MD  metFORMIN (GLUCOPHAGE-XR) 500 MG 24 hr tablet Take 1,000 mg by mouth 2 (two) times daily.   Yes Historical Provider, MD  naproxen sodium (ANAPROX) 220 MG tablet Take 220 mg by mouth 2 (two) times daily as needed (pain).   Yes Historical Provider, MD  pravastatin (PRAVACHOL) 10 MG tablet Take 10 mg by mouth daily.   Yes Historical Provider, MD  saxagliptin HCl (ONGLYZA) 5 MG TABS tablet Take 5 mg by mouth daily.   Yes Historical Provider, MD    Allergies as of 04/22/2015  . (Not on File)    History reviewed. No pertinent family history.  History   Social History  . Marital Status: Married    Spouse Name: N/A  . Number of Children: N/A  . Years of Education: N/A    Occupational History  . Not on file.   Social History Main Topics  . Smoking status: Never Smoker   . Smokeless tobacco: Never Used  . Alcohol Use: No  . Drug Use: No  . Sexual Activity: Not on file   Other Topics Concern  . Not on file   Social History Narrative     Physical Exam: BP 128/84 mmHg  Pulse 94  Temp(Src) 98 F (36.7 C) (Tympanic)  Resp 16  Ht 5\' 7"  (1.702 m)  Wt 237 lb (107.502 kg)  BMI 37.11 kg/m2  SpO2 100% General:   Alert,  pleasant and cooperative in NAD Head:  Normocephalic and atraumatic. Neck:  Supple; no masses or thyromegaly. Lungs:  Clear throughout to auscultation.    Heart:  Regular rate and rhythm. Abdomen:  Soft, nontender and nondistended. Normal bowel sounds, without guarding, and without rebound.   Neurologic:  Alert and  oriented x4;  grossly normal neurologically.  Impression/Plan: Benjamin [name] is here for an colonoscopy to be performed for screening  Risks, benefits, limitations, and alternatives regarding  colonoscopy have been reviewed with the patient.  Questions have been answered.  All parties agreeable.   [name], MD  06/02/2015, 8:08 AM

## 2015-06-03 LAB — SURGICAL PATHOLOGY

## 2015-06-04 ENCOUNTER — Other Ambulatory Visit: Payer: Self-pay | Admitting: *Deleted

## 2015-06-04 MED ORDER — LORAZEPAM 1 MG PO TABS: 1.0000 mg | ORAL_TABLET | Freq: Three times a day (TID) | ORAL | Status: DC | PRN

## 2015-06-04 NOTE — Telephone Encounter (Signed)
Med called in per ok of Dr Ma Hillock

## 2015-06-05 ENCOUNTER — Encounter: Payer: Self-pay | Admitting: Gastroenterology

## 2015-07-15 ENCOUNTER — Inpatient Hospital Stay: Payer: No Typology Code available for payment source | Attending: Internal Medicine

## 2015-07-15 ENCOUNTER — Other Ambulatory Visit: Payer: Self-pay | Admitting: *Deleted

## 2015-07-15 ENCOUNTER — Inpatient Hospital Stay: Payer: No Typology Code available for payment source

## 2015-07-15 DIAGNOSIS — D751 Secondary polycythemia: Secondary | ICD-10-CM

## 2015-07-15 LAB — IRON AND TIBC
IRON: 31 ug/dL — AB (ref 45–182)
Saturation Ratios: 6 % — ABNORMAL LOW (ref 17.9–39.5)
TIBC: 499 ug/dL — ABNORMAL HIGH (ref 250–450)
UIBC: 468 ug/dL

## 2015-07-15 LAB — HEMATOCRIT: HCT: 49.4 % (ref 40.0–52.0)

## 2015-07-15 LAB — FERRITIN: FERRITIN: 13 ng/mL — AB (ref 24–336)

## 2015-07-22 ENCOUNTER — Telehealth: Payer: Self-pay | Admitting: *Deleted

## 2015-07-22 DIAGNOSIS — F4323 Adjustment disorder with mixed anxiety and depressed mood: Secondary | ICD-10-CM

## 2015-07-22 MED ORDER — LORAZEPAM 1 MG PO TABS: 1.0000 mg | ORAL_TABLET | Freq: Three times a day (TID) | ORAL | Status: DC | PRN

## 2015-07-22 NOTE — Telephone Encounter (Signed)
Is requesting a refill on Lorazepam. A pt of Dr. Beverly Gust; uses [facility].Marland KitchenMarland Kitchen

## 2015-07-23 ENCOUNTER — Telehealth: Payer: Self-pay | Admitting: *Deleted

## 2015-07-23 NOTE — Telephone Encounter (Signed)
States that med is not at pharmacy, I called [facility] who confirmed no receipt and gave them a verbal order to refill. Pt informed

## 2015-09-02 ENCOUNTER — Other Ambulatory Visit: Payer: Self-pay | Admitting: *Deleted

## 2015-09-02 ENCOUNTER — Other Ambulatory Visit: Payer: Self-pay | Admitting: Internal Medicine

## 2015-09-02 DIAGNOSIS — F4323 Adjustment disorder with mixed anxiety and depressed mood: Secondary | ICD-10-CM

## 2015-09-02 MED ORDER — LORAZEPAM 1 MG PO TABS: 1.0000 mg | ORAL_TABLET | Freq: Three times a day (TID) | ORAL | Status: DC | PRN

## 2015-09-02 NOTE — Telephone Encounter (Signed)
faxed

## 2015-09-09 ENCOUNTER — Inpatient Hospital Stay (HOSPITAL_BASED_OUTPATIENT_CLINIC_OR_DEPARTMENT_OTHER): Payer: No Typology Code available for payment source | Admitting: Internal Medicine

## 2015-09-09 ENCOUNTER — Encounter: Payer: Self-pay | Admitting: Internal Medicine

## 2015-09-09 ENCOUNTER — Inpatient Hospital Stay: Payer: No Typology Code available for payment source

## 2015-09-09 ENCOUNTER — Inpatient Hospital Stay: Payer: No Typology Code available for payment source | Attending: Internal Medicine

## 2015-09-09 VITALS — BP 116/77 | HR 99 | Resp 20

## 2015-09-09 VITALS — BP 114/78 | HR 98 | Temp 98.0°F | Resp 20 | Ht 67.0 in | Wt 237.0 lb

## 2015-09-09 DIAGNOSIS — D72829 Elevated white blood cell count, unspecified: Secondary | ICD-10-CM | POA: Diagnosis not present

## 2015-09-09 DIAGNOSIS — D751 Secondary polycythemia: Secondary | ICD-10-CM

## 2015-09-09 DIAGNOSIS — Z23 Encounter for immunization: Secondary | ICD-10-CM | POA: Diagnosis not present

## 2015-09-09 DIAGNOSIS — F419 Anxiety disorder, unspecified: Secondary | ICD-10-CM | POA: Insufficient documentation

## 2015-09-09 DIAGNOSIS — K8080 Other cholelithiasis without obstruction: Secondary | ICD-10-CM

## 2015-09-09 DIAGNOSIS — N1330 Unspecified hydronephrosis: Secondary | ICD-10-CM | POA: Diagnosis not present

## 2015-09-09 DIAGNOSIS — M129 Arthropathy, unspecified: Secondary | ICD-10-CM | POA: Insufficient documentation

## 2015-09-09 DIAGNOSIS — R5383 Other fatigue: Secondary | ICD-10-CM | POA: Diagnosis not present

## 2015-09-09 DIAGNOSIS — G4730 Sleep apnea, unspecified: Secondary | ICD-10-CM

## 2015-09-09 DIAGNOSIS — D509 Iron deficiency anemia, unspecified: Secondary | ICD-10-CM | POA: Diagnosis not present

## 2015-09-09 DIAGNOSIS — E119 Type 2 diabetes mellitus without complications: Secondary | ICD-10-CM | POA: Insufficient documentation

## 2015-09-09 DIAGNOSIS — Z79899 Other long term (current) drug therapy: Secondary | ICD-10-CM | POA: Insufficient documentation

## 2015-09-09 LAB — CBC WITH DIFFERENTIAL/PLATELET
BASOS ABS: 0.1 10*3/uL (ref 0–0.1)
Basophils Relative: 1 %
Eosinophils Absolute: 0.2 10*3/uL (ref 0–0.7)
Eosinophils Relative: 2 %
HEMATOCRIT: 49.1 % (ref 40.0–52.0)
Hemoglobin: 15.6 g/dL (ref 13.0–18.0)
LYMPHS ABS: 2.7 10*3/uL (ref 1.0–3.6)
LYMPHS PCT: 28 %
MCH: 23 pg — AB (ref 26.0–34.0)
MCHC: 31.8 g/dL — ABNORMAL LOW (ref 32.0–36.0)
MCV: 72.3 fL — AB (ref 80.0–100.0)
MONO ABS: 1 10*3/uL (ref 0.2–1.0)
Monocytes Relative: 10 %
NEUTROS ABS: 5.8 10*3/uL (ref 1.4–6.5)
Neutrophils Relative %: 59 %
Platelets: 272 10*3/uL (ref 150–440)
RBC: 6.78 MIL/uL — AB (ref 4.40–5.90)
RDW: 17 % — ABNORMAL HIGH (ref 11.5–14.5)
WBC: 9.7 10*3/uL (ref 3.8–10.6)

## 2015-09-09 MED ORDER — INFLUENZA VAC SPLIT QUAD 0.5 ML IM SUSY
0.5000 mL | PREFILLED_SYRINGE | Freq: Once | INTRAMUSCULAR | Status: AC
  Administered 2015-09-09: 0.5 mL via INTRAMUSCULAR
  Filled 2015-09-09: qty 0.5

## 2015-09-09 NOTE — Progress Notes (Signed)
Pt doing well and no c/o. Feels better when he has the phlebotomy.

## 2015-09-28 NOTE — Progress Notes (Signed)
[place]  Telephone:(336) [phone] Fax:(336) [phone]     ID: Orry Sigl Hunke OB: 09-20-1951  MR#: [id number]  FIE#:[id number]  Patient Care Team: Maryland Pink, MD as PCP - General (Family Medicine)  CHIEF COMPLAINT/DIAGNOSIS:  Erythrocytosis, mild leukocytosis. 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 Nonsmoker, has history of sleep apnea.  July 2011 - [id number] mutation negative, serum EPO 37.8, BCR-ABL negative.  Workup: Serum EPO 12.9, LAP score 108. Vitamin B12 501. Ferritin 321. Ultrasound of the abdomen was negative for splenomegaly.  Spleen measured 11 cm.  Mild to moderate hydronephrosis of the left kidney was seen and gallstones were present. A red blood cell volume study on 03/12/04 done at [facility] showed normal red cell mass, no evidence of polycythemia vera.   HISTORY OF PRESENT ILLNESS:  Patient returns for continued hematology followup, he was last seen in February 2016. In between he has had hematocrit monitored and it continues to fluctuate in the upper normal range. States that he is doing fairly steady, he has chronic fatigue on exertion. Denies any new dyspnea, chest pain, orthopnea or PND. 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 Denies any fevers or night sweats. Denies any severe headaches, facial flushing, new focal weakness, slurred speech or other symptoms to suggest TIA or stroke. No angina, dizziness or falls. No history of deep venous thrombosis or pulmonary embolism. He has sleep apnea, uses oxygen at night and states that he is doing well with this.  REVIEW OF SYSTEMS:   ROS As in HPI above. In addition, no fevers or sweats. No new headaches or focal weakness.  No new  sore throat, cough, sputum, hemoptysis or chest pain. No dizziness or palpitation. No abdominal pain, constipation, diarrhea, dysuria or hematuria. No new skin rash or bleeding symptoms. No new  paresthesias in extremities.   PAST MEDICAL HISTORY: Reviewed. Past Medical History  Diagnosis Date  . Erythrocytosis 05/18/2015  . Diabetes mellitus without complication   . Anxiety   . Sleep apnea   . Polycythemia   . Erythrocytosis   . Arthritis     PAST SURGICAL HISTORY: Reviewed. Past Surgical History  Procedure Laterality Date  . Cataracts    . Colonoscopy N/A 06/02/2015    Procedure: COLONOSCOPY;  Surgeon: [name], MD;  Location: [facility] ENDOSCOPY;  Service: Endoscopy;  Laterality: N/A;    FAMILY HISTORY: Reviewed. Family History  Problem Relation Age of Onset  . Heart disease Mother   . Diabetes Father     SOCIAL HISTORY: Reviewed. Social History  Substance Use Topics  . Smoking status: Never Smoker   . Smokeless tobacco: Never Used  . Alcohol Use: No    Allergies  Allergen Reactions  . Sertraline Hcl     Current Outpatient Prescriptions  Medication Sig Dispense Refill  . acetaminophen (TYLENOL) 500 MG tablet Take 1,000 mg by mouth every 6 (six) hours as needed for mild pain.    . canagliflozin (INVOKANA) 100 MG TABS tablet Take 100 mg by mouth daily.    . cetirizine (ZYRTEC) 10 MG tablet Take 10 mg by mouth daily as needed for allergies.    Marland Kitchen glipiZIDE (GLUCOTROL) 5 MG tablet Take 5 mg by mouth 2 (two) times daily.    Marland Kitchen HYDROcodone-acetaminophen (NORCO/VICODIN) 5-325 MG per tablet Take 1 tablet by mouth every 6 (six) hours as needed for moderate pain.    Marland Kitchen LORazepam (ATIVAN) 1 MG tablet Take 1 tablet (  1 mg total) by mouth 3 (three) times daily as needed for anxiety. 90 tablet 0  . metFORMIN (GLUCOPHAGE-XR) 500 MG 24 hr tablet Take 1,000 mg by mouth 2 (two) times daily.    . pravastatin (PRAVACHOL) 10 MG tablet Take 10 mg by mouth daily.    . saxagliptin HCl (ONGLYZA) 5 MG TABS tablet Take 5 mg by mouth daily.     No current facility-administered medications for this visit.    PHYSICAL EXAM: Filed Vitals:   09/09/15 1405  BP: 114/78  Pulse:  98  Temp: 98 F (36.7 C)  Resp: 20     Body mass index is 37.11 kg/(m^2).     GENERAL: Patient is alert and oriented and in no acute distress. There is no icterus. HEENT: EOMs intact. No cervical lymphadenopathy. CVS: S1S2, regular LUNGS: Bilaterally clear to auscultation, no rhonchi. ABDOMEN: Soft, obese, nontender. No hepatosplenomegaly clinically.  NEURO: grossly nonfocal, cranial nerves are intact. Gait unremarkable. EXTREMITIES: No pedal edema.   LAB RESULTS:    Component Value Date/Time   CREATININE 1.06 08/29/2012 0907   GFRNONAA >60 08/29/2012 0907   GFRAA >60 08/29/2012 0907    Lab Results  Component Value Date   WBC 9.7 09/09/2015   NEUTROABS 5.8 09/09/2015   HGB 15.6 09/09/2015   HCT 49.1 09/09/2015   MCV 72.3* 09/09/2015   PLT 272 09/09/2015    Lab Results  Component Value Date   IRON 31* 07/15/2015     ASSESSMENT / PLAN:   1.  Erythrocytosis - have reviewed labs and discussed with patient. Have explained that despite phlebotomy and also being iron deficient, hematocrit continues to be in the upper normal range. Clinically he is doing steady without recurrent headaches or h/o thromboembolic phenomena.  Hct is 49.1% today. Plan is to continue monitoring hematocrit once every 8 weeks and pursue phlebotomy of 400 mL if Hct is 47 or higher. Get iron study at 24 weeks. Next MD followup in about 7-8 months and make further treatment planning. 2.  History of mild leukocytosis - WBC currently normalised. BCR-ABL study and [id number] mutation were negative in the past. Continue to monitor intermittently. 3. Anxiety  - continue lorazepam when necessary.  4. In between visits, the patient has been advised to call or come to the ER in case of fevers, bleeding, acute sickness or new symptoms. He is agreeable to this plan.    Leia Alf, MD   09/28/2015 12:19 PM

## 2015-10-20 ENCOUNTER — Other Ambulatory Visit: Payer: Self-pay | Admitting: *Deleted

## 2015-10-20 DIAGNOSIS — F4323 Adjustment disorder with mixed anxiety and depressed mood: Secondary | ICD-10-CM

## 2015-10-20 MED ORDER — LORAZEPAM 1 MG PO TABS
1.0000 mg | ORAL_TABLET | Freq: Every day | ORAL | Status: DC
Start: 2015-10-20 — End: 2024-04-24

## 2015-10-20 NOTE — Telephone Encounter (Signed)
Called pt and informed him that this needs to come form PMD and that we will call in # 15 tabs with new sig of 1 tab daily prn. He asked we call in the #15

## 2015-11-04 ENCOUNTER — Inpatient Hospital Stay: Payer: No Typology Code available for payment source

## 2015-11-04 ENCOUNTER — Inpatient Hospital Stay: Payer: No Typology Code available for payment source | Attending: Family Medicine

## 2015-11-04 DIAGNOSIS — D751 Secondary polycythemia: Secondary | ICD-10-CM | POA: Diagnosis not present

## 2015-11-04 DIAGNOSIS — Z23 Encounter for immunization: Secondary | ICD-10-CM

## 2015-11-04 LAB — HEMATOCRIT: HEMATOCRIT: 47 % (ref 40.0–52.0)

## 2015-12-30 ENCOUNTER — Inpatient Hospital Stay: Payer: BLUE CROSS/BLUE SHIELD

## 2015-12-30 ENCOUNTER — Inpatient Hospital Stay: Payer: BLUE CROSS/BLUE SHIELD | Attending: Family Medicine

## 2015-12-30 VITALS — BP 119/81 | HR 92 | Temp 97.3°F | Resp 20

## 2015-12-30 DIAGNOSIS — D751 Secondary polycythemia: Secondary | ICD-10-CM | POA: Insufficient documentation

## 2015-12-30 DIAGNOSIS — Z23 Encounter for immunization: Secondary | ICD-10-CM

## 2015-12-30 LAB — HEMATOCRIT: HEMATOCRIT: 49.1 % (ref 40.0–52.0)

## 2016-02-24 ENCOUNTER — Inpatient Hospital Stay: Payer: BLUE CROSS/BLUE SHIELD

## 2016-02-24 ENCOUNTER — Inpatient Hospital Stay: Payer: BLUE CROSS/BLUE SHIELD | Attending: Family Medicine

## 2016-02-24 VITALS — BP 130/84 | HR 99 | Resp 20

## 2016-02-24 DIAGNOSIS — D751 Secondary polycythemia: Secondary | ICD-10-CM

## 2016-02-24 DIAGNOSIS — Z23 Encounter for immunization: Secondary | ICD-10-CM

## 2016-02-24 LAB — HEMATOCRIT: HCT: 48.7 % (ref 40.0–52.0)

## 2016-04-20 ENCOUNTER — Inpatient Hospital Stay: Payer: BLUE CROSS/BLUE SHIELD | Attending: Family Medicine

## 2016-04-20 ENCOUNTER — Inpatient Hospital Stay: Payer: BLUE CROSS/BLUE SHIELD

## 2016-04-20 ENCOUNTER — Inpatient Hospital Stay (HOSPITAL_BASED_OUTPATIENT_CLINIC_OR_DEPARTMENT_OTHER): Payer: BLUE CROSS/BLUE SHIELD | Admitting: Family Medicine

## 2016-04-20 VITALS — BP 128/83 | HR 86 | Resp 18

## 2016-04-20 VITALS — BP 130/83 | HR 98 | Temp 97.0°F | Wt 234.5 lb

## 2016-04-20 DIAGNOSIS — Z7984 Long term (current) use of oral hypoglycemic drugs: Secondary | ICD-10-CM

## 2016-04-20 DIAGNOSIS — R5383 Other fatigue: Secondary | ICD-10-CM | POA: Diagnosis not present

## 2016-04-20 DIAGNOSIS — D72829 Elevated white blood cell count, unspecified: Secondary | ICD-10-CM | POA: Insufficient documentation

## 2016-04-20 DIAGNOSIS — D751 Secondary polycythemia: Secondary | ICD-10-CM | POA: Diagnosis not present

## 2016-04-20 DIAGNOSIS — M129 Arthropathy, unspecified: Secondary | ICD-10-CM | POA: Insufficient documentation

## 2016-04-20 DIAGNOSIS — Z79899 Other long term (current) drug therapy: Secondary | ICD-10-CM

## 2016-04-20 DIAGNOSIS — Z23 Encounter for immunization: Secondary | ICD-10-CM

## 2016-04-20 DIAGNOSIS — G4730 Sleep apnea, unspecified: Secondary | ICD-10-CM | POA: Insufficient documentation

## 2016-04-20 DIAGNOSIS — F419 Anxiety disorder, unspecified: Secondary | ICD-10-CM

## 2016-04-20 DIAGNOSIS — E119 Type 2 diabetes mellitus without complications: Secondary | ICD-10-CM | POA: Diagnosis not present

## 2016-04-20 LAB — CBC WITH DIFFERENTIAL/PLATELET
BASOS ABS: 0 10*3/uL (ref 0–0.1)
BASOS PCT: 0 %
Eosinophils Absolute: 0.4 10*3/uL (ref 0–0.7)
Eosinophils Relative: 5 %
HEMATOCRIT: 50.1 % (ref 40.0–52.0)
HEMOGLOBIN: 16.4 g/dL (ref 13.0–18.0)
LYMPHS PCT: 27 %
Lymphs Abs: 2.4 10*3/uL (ref 1.0–3.6)
MCH: 23.9 pg — ABNORMAL LOW (ref 26.0–34.0)
MCHC: 32.7 g/dL (ref 32.0–36.0)
MCV: 73 fL — ABNORMAL LOW (ref 80.0–100.0)
Monocytes Absolute: 0.9 10*3/uL (ref 0.2–1.0)
Monocytes Relative: 11 %
NEUTROS ABS: 5.2 10*3/uL (ref 1.4–6.5)
NEUTROS PCT: 57 %
Platelets: 284 10*3/uL (ref 150–440)
RBC: 6.87 MIL/uL — ABNORMAL HIGH (ref 4.40–5.90)
RDW: 16 % — ABNORMAL HIGH (ref 11.5–14.5)
WBC: 8.9 10*3/uL (ref 3.8–10.6)

## 2016-04-20 LAB — IRON AND TIBC
IRON: 22 ug/dL — AB (ref 45–182)
SATURATION RATIOS: 5 % — AB (ref 17.9–39.5)
TIBC: 426 ug/dL (ref 250–450)
UIBC: 404 ug/dL

## 2016-04-20 LAB — FERRITIN: Ferritin: 31 ng/mL (ref 24–336)

## 2016-04-20 NOTE — Progress Notes (Signed)
[place]  [phone]  [phone]     Benjamin [name] DOB: 08/17/1951  MR#: [id number]  [id number]  Patient Care Team: Maryland Pink, MD as PCP - General (Family Medicine)  CHIEF COMPLAINT:  Chief Complaint  Patient presents with  . polycythemia    INTERVAL HISTORY:   Patient is here for continued hematology follow-up regarding erythrocytosis and mild leukocytosis. Patient reports feeling fairly well. He has some chronic fatigue with heavy exertion but overall feels in his normal state of health. He continues with the use of the CPAP for sleep apnea. He currently denies any severe headaches, flushing, weakness, slurred speech.  REVIEW OF SYSTEMS:   Review of Systems  Constitutional: Negative for fever, chills, weight loss, malaise/fatigue and diaphoresis.  HENT: Negative.   Eyes: Negative.   Respiratory: Negative for cough, hemoptysis, sputum production, shortness of breath and wheezing.   Cardiovascular: Negative for chest pain, palpitations, orthopnea, claudication, leg swelling and PND.  Gastrointestinal: Negative for heartburn, nausea, vomiting, abdominal pain, diarrhea, constipation, blood in stool and melena.  Genitourinary: Negative.   Musculoskeletal: Negative.   Skin: Negative.   Neurological: Negative for dizziness, tingling, focal weakness, seizures and weakness.  Endo/Heme/Allergies: Does not bruise/bleed easily.  Psychiatric/Behavioral: Negative for depression. The patient is not nervous/anxious and does not have insomnia.     As per HPI. Otherwise, a complete review of systems is negatve.   PAST MEDICAL HISTORY: Past Medical History  Diagnosis Date  . Erythrocytosis 05/18/2015  . Diabetes mellitus without complication   . Anxiety   . Sleep apnea   . Polycythemia   . Erythrocytosis   . Arthritis     PAST SURGICAL HISTORY: Past Surgical History  Procedure Laterality Date  . Cataracts    . Colonoscopy N/A  06/02/2015    Procedure: COLONOSCOPY;  Surgeon: [name], MD;  Location: [facility] ENDOSCOPY;  Service: Endoscopy;  Laterality: N/A;    FAMILY HISTORY Family History  Problem Relation Age of Onset  . Heart disease Mother   . Diabetes Father     GYNECOLOGIC HISTORY:  No LMP for male patient.     ADVANCED DIRECTIVES:    HEALTH MAINTENANCE: Social History  Substance Use Topics  . Smoking status: Never Smoker   . Smokeless tobacco: Never Used  . Alcohol Use: No     Allergies  Allergen Reactions  . Sertraline Hcl     Current Outpatient Prescriptions  Medication Sig Dispense Refill  . acetaminophen (TYLENOL) 500 MG tablet Take 1,000 mg by mouth every 6 (six) hours as needed for mild pain.    . canagliflozin (INVOKANA) 100 MG TABS tablet Take 100 mg by mouth daily.    . cetirizine (ZYRTEC) 10 MG tablet Take 10 mg by mouth daily as needed for allergies.    Marland Kitchen glipiZIDE (GLUCOTROL) 5 MG tablet Take 5 mg by mouth 2 (two) times daily.    Marland Kitchen HYDROcodone-acetaminophen (NORCO/VICODIN) 5-325 MG per tablet Take 1 tablet by mouth every 6 (six) hours as needed for moderate pain.    Marland Kitchen LORazepam (ATIVAN) 1 MG tablet Take 1 tablet (1 mg total) by mouth daily. 15 tablet 0  . metFORMIN (GLUCOPHAGE-XR) 500 MG 24 hr tablet Take 1,000 mg by mouth 2 (two) times daily.    . pravastatin (PRAVACHOL) 10 MG tablet Take 10 mg by mouth daily.    . saxagliptin HCl (ONGLYZA) 5 MG TABS tablet Take 5 mg by mouth daily.     No current facility-administered  medications for this visit.    OBJECTIVE: BP 130/83 mmHg  Pulse 98  Temp(Src) 97 F (36.1 C) (Tympanic)  Wt 234 lb 7.4 oz (106.35 kg)   Body mass index is 36.71 kg/(m^2).    ECOG FS:0 - Asymptomatic  General: Well-developed, well-nourished, no acute distress. Eyes: Pink conjunctiva, anicteric sclera. HEENT: Normocephalic, moist mucous membranes, clear oropharnyx. Lungs: Clear to auscultation bilaterally. Heart: Regular rate and rhythm. No rubs,  murmurs, or gallops. Musculoskeletal: No edema, cyanosis, or clubbing. Neuro: Alert, answering all questions appropriately. Cranial nerves grossly intact. Skin: No rashes or petechiae noted. Psych: Normal affect.    LAB RESULTS:  Appointment on 04/20/2016  Component Date Value Ref Range Status  . WBC 04/20/2016 8.9  3.8 - 10.6 K/uL Final  . RBC 04/20/2016 6.87* 4.40 - 5.90 MIL/uL Final  . Hemoglobin 04/20/2016 16.4  13.0 - 18.0 g/dL Final  . HCT 04/20/2016 50.1  40.0 - 52.0 % Final  . MCV 04/20/2016 73.0* 80.0 - 100.0 fL Final  . MCH 04/20/2016 23.9* 26.0 - 34.0 pg Final  . MCHC 04/20/2016 32.7  32.0 - 36.0 g/dL Final  . RDW 04/20/2016 16.0* 11.5 - 14.5 % Final  . Platelets 04/20/2016 284  150 - 440 K/uL Final  . Neutrophils Relative % 04/20/2016 57   Final  . Neutro Abs 04/20/2016 5.2  1.4 - 6.5 K/uL Final  . Lymphocytes Relative 04/20/2016 27   Final  . Lymphs Abs 04/20/2016 2.4  1.0 - 3.6 K/uL Final  . Monocytes Relative 04/20/2016 11   Final  . Monocytes Absolute 04/20/2016 0.9  0.2 - 1.0 K/uL Final  . Eosinophils Relative 04/20/2016 5   Final  . Eosinophils Absolute 04/20/2016 0.4  0 - 0.7 K/uL Final  . Basophils Relative 04/20/2016 0   Final  . Basophils Absolute 04/20/2016 0.0  0 - 0.1 K/uL Final    STUDIES: No results found.  ASSESSMENT:   Erythrocytosis.  PLAN:   1. Erythrocytosis. Labs have been reviewedand discussed with patient. Have explained that despite phlebotomy and also being iron deficient, hematocrit continues to be in the upper normal range. Clinically he is doing steady without recurrent headaches or h/o thromboembolic phenomena. Hct is 50.1% today. Plan is to continue monitoring hematocrit once every 8 weeks and pursue phlebotomy of 400 mL if Hct is 47 or higher.  At patient request will have him return in approximately 8 weeks to establish with a hematologist. 2. History of mild leukocytosis. WBC currently normalised. BCR-ABL study and [id number]  mutation were negative in the past. Continue to monitor intermittently.  Patient expressed understanding and was in agreement with this plan. He also understands that He can call clinic at any time with any questions, concerns, or complaints.   Dr. Oliva Bustard was available for consultation and review of plan of care for this patient.   Evlyn Kanner, NP   04/20/2016 2:41 PM

## 2016-06-15 ENCOUNTER — Inpatient Hospital Stay: Payer: Medicare HMO

## 2016-06-15 ENCOUNTER — Other Ambulatory Visit: Payer: Self-pay | Admitting: Hematology and Oncology

## 2016-06-15 ENCOUNTER — Inpatient Hospital Stay: Payer: Medicare HMO | Attending: Hematology and Oncology | Admitting: Hematology and Oncology

## 2016-06-15 VITALS — BP 132/86 | HR 101 | Temp 95.4°F | Resp 17 | Ht 67.0 in | Wt 233.4 lb

## 2016-06-15 DIAGNOSIS — G4730 Sleep apnea, unspecified: Secondary | ICD-10-CM | POA: Insufficient documentation

## 2016-06-15 DIAGNOSIS — Z7984 Long term (current) use of oral hypoglycemic drugs: Secondary | ICD-10-CM | POA: Diagnosis not present

## 2016-06-15 DIAGNOSIS — D120 Benign neoplasm of cecum: Secondary | ICD-10-CM | POA: Diagnosis not present

## 2016-06-15 DIAGNOSIS — F419 Anxiety disorder, unspecified: Secondary | ICD-10-CM | POA: Diagnosis not present

## 2016-06-15 DIAGNOSIS — Z79899 Other long term (current) drug therapy: Secondary | ICD-10-CM | POA: Diagnosis not present

## 2016-06-15 DIAGNOSIS — D751 Secondary polycythemia: Secondary | ICD-10-CM | POA: Diagnosis not present

## 2016-06-15 DIAGNOSIS — M129 Arthropathy, unspecified: Secondary | ICD-10-CM | POA: Insufficient documentation

## 2016-06-15 DIAGNOSIS — E119 Type 2 diabetes mellitus without complications: Secondary | ICD-10-CM

## 2016-06-15 DIAGNOSIS — R5383 Other fatigue: Secondary | ICD-10-CM | POA: Diagnosis not present

## 2016-06-15 LAB — CBC WITH DIFFERENTIAL/PLATELET
Basophils Absolute: 0.1 10*3/uL (ref 0–0.1)
Basophils Relative: 1 %
Eosinophils Absolute: 1 10*3/uL — ABNORMAL HIGH (ref 0–0.7)
Eosinophils Relative: 12 %
HCT: 48.1 % (ref 40.0–52.0)
Hemoglobin: 16 g/dL (ref 13.0–18.0)
Lymphocytes Relative: 31 %
Lymphs Abs: 2.5 10*3/uL (ref 1.0–3.6)
MCH: 24 pg — ABNORMAL LOW (ref 26.0–34.0)
MCHC: 33.3 g/dL (ref 32.0–36.0)
MCV: 71.9 fL — ABNORMAL LOW (ref 80.0–100.0)
Monocytes Absolute: 0.7 10*3/uL (ref 0.2–1.0)
Monocytes Relative: 9 %
Neutro Abs: 4 10*3/uL (ref 1.4–6.5)
Neutrophils Relative %: 47 %
Platelets: 261 10*3/uL (ref 150–440)
RBC: 6.69 MIL/uL — ABNORMAL HIGH (ref 4.40–5.90)
RDW: 17.3 % — ABNORMAL HIGH (ref 11.5–14.5)
WBC: 8.3 10*3/uL (ref 3.8–10.6)

## 2016-06-15 LAB — IRON AND TIBC
Iron: 17 ug/dL — ABNORMAL LOW (ref 45–182)
Saturation Ratios: 4 % — ABNORMAL LOW (ref 17.9–39.5)
TIBC: 462 ug/dL — ABNORMAL HIGH (ref 250–450)
UIBC: 445 ug/dL

## 2016-06-15 LAB — FERRITIN: Ferritin: 19 ng/mL — ABNORMAL LOW (ref 24–336)

## 2016-06-15 NOTE — Progress Notes (Signed)
[facility] day:  06/15/2016  Chief Complaint: Benjamin [name] is a 65 y.o. male with erythrocytosis who is seen for reassessment.  HPI: The patient states that he was diagnosed with erythrocytosis in 2005 at [facility].  He describes waking up one day and feeling a heat rush.  He describes hallucinating about people in the living room.  He was brought to the ER.  He underwent CT scans, stress test and echo.  He was diagnosed with a "blood test".   Red cell volume study at [facility] on 03/12/2004 revealed a normal red cell mass and no evidence of polycythemia vera.  Notes indicate in 06/2010, BCR-ABL and JAK2 V617F were negative.  Epo level was 37.8.  Additional testing noted an epo of 12.9, LAP score 108, B12 of 501, and ferritin 321.  Abdominal ultrasound revealed no splenomegaly.  He denies any tobacco or testosterone use.  He has sleep apnea.  The patient was followed by Dr. Ma Hillock for 11-12 years.  He initially underwent phlebotomy every 2 weeks.  He now undergoes phlebotomy every 2 months.  Records indicate phlebotomy of 400 cc if his hematocrit is > 47.  He underwent phlebotomy on 04/20/2016 (HCT 50.1), 02/24/2016 (HCT 48.7), 12/30/2015 (HCT 49.1), 09/09/2015 (HCT 49.1), 07/15/2015 (49.4), and 05/20/2015 (50.1).  He states that sometimes he can tell if he needs a phlebotomy as he gets more fatigued.  He denies any headache, dizziness, lightheadedness or symptoms of erythromelalgia with an elevated hematocrit.  He denies any history of thrombosis.  He was last seen by Georgeanne Nim, NP, on 04/20/2016.  At that time, he noted some chronic fatigue.  He was using his CPAP for sleep apnea.  He underwent phlebotomy.  CBC rrevealed a hematocrit of 50.1, hemoglobin 16.4, MCV 73, platelets 284,000, WBC 8900 with an ANC of 5200.  Differential was unremarkable.  Symptomatically, he denies any complaints.  He describes 2 weeks ago sitting in a chair and the room  spinning.  He may have been dehydrated.  He has had no further episodes.  He can get dizzy if he gets up quickly.  He describes a "regular diet".  He has dropped from 268 pounds to 233 pounds over several years by eating less.  Colonoscopy on 06/02/2015 by Dr. Arther Dames revealed 2 polyps (6 mm and 20 mm) in the cecum.  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   Past Medical History  Diagnosis Date  . Erythrocytosis 05/18/2015  . Diabetes mellitus without complication   . Anxiety   . Sleep apnea   . Polycythemia   . Erythrocytosis   . Arthritis     Past Surgical History  Procedure Laterality Date  . Cataracts    . Colonoscopy N/A 06/02/2015    Procedure: COLONOSCOPY;  Surgeon: [name], MD;  Location: [facility] ENDOSCOPY;  Service: Endoscopy;  Laterality: N/A;    Family History  Problem Relation Age of Onset  . Heart disease Mother   . Diabetes Father     Social History:  reports that he has never smoked. He has never used smokeless tobacco. He reports that he does not drink alcohol or use illicit drugs.  He is retired.  He works at [employer] part time.  The patient is alone today.  Allergies:  Allergies  Allergen Reactions  . Sertraline Hcl     Current Medications: Current Outpatient Prescriptions  Medication Sig Dispense Refill  .  acetaminophen (TYLENOL) 500 MG tablet Take 1,000 mg by mouth every 6 (six) hours as needed for mild pain.    . canagliflozin (INVOKANA) 100 MG TABS tablet Take 100 mg by mouth daily.    Marland Kitchen glipiZIDE (GLUCOTROL) 5 MG tablet Take 5 mg by mouth 2 (two) times daily.    Marland Kitchen LORazepam (ATIVAN) 1 MG tablet Take 1 tablet (1 mg total) by mouth daily. 15 tablet 0  . metFORMIN (GLUCOPHAGE-XR) 500 MG 24 hr tablet Take 1,000 mg by mouth 2 (two) times daily.    . pravastatin (PRAVACHOL) 10 MG tablet Take 10 mg by mouth daily.    . saxagliptin HCl (ONGLYZA) 5 MG TABS tablet Take 5 mg by mouth daily.    .  cetirizine (ZYRTEC) 10 MG tablet Take 10 mg by mouth daily as needed for allergies. Reported on 06/15/2016    . HYDROcodone-acetaminophen (NORCO/VICODIN) 5-325 MG per tablet Take 1 tablet by mouth every 6 (six) hours as needed for moderate pain. Reported on 06/15/2016    . hydrocortisone 2.5 % cream Apply topically. Reported on 06/15/2016     No current facility-administered medications for this visit.    Review of Systems:  GENERAL:  Feels good.  Active.  No fevers, sweats or weight loss. PERFORMANCE STATUS (ECOG):  0 HEENT:  No visual changes, runny nose, sore throat, mouth sores or tenderness. Lungs: No shortness of breath or cough.  No hemoptysis. Cardiac:  No chest pain, palpitations, orthopnea, or PND. GI:  No nausea, vomiting, diarrhea, constipation, melena or hematochezia. GU:  No urgency, frequency, dysuria, or hematuria. Musculoskeletal:  No back pain.  No joint pain.  No muscle tenderness. Extremities:  No pain or swelling. Skin:  No rashes or skin changes. Neuro:  No headache, numbness or weakness, balance or coordination issues. Endocrine:  Diabetes.  No thyroid issues, hot flashes or night sweats. Psych:  No mood changes, depression or anxiety. Pain:  No focal pain. Review of systems:  All other systems reviewed and found to be negative.  Physical Exam: Blood pressure 132/86, pulse 101, temperature 95.4 F (35.2 C), temperature source Tympanic, resp. rate 17, height 5\' 7"  (1.702 m), weight 233 lb 5.7 oz (105.85 kg). GENERAL:  Well developed, well nourished, sitting comfortably in the exam room in no acute distress. MENTAL STATUS:  Alert and oriented to person, place and time. HEAD:  Short gray hair.  Normocephalic, atraumatic, face symmetric, no Cushingoid features. EYES:  Blue eyes.  Pupils equal round and reactive to light and accomodation.  No conjunctivitis or scleral icterus. ENT:  Oropharynx clear without lesion.  Tongue normal. Mucous membranes moist.  RESPIRATORY:   Clear to auscultation without rales, wheezes or rhonchi. CARDIOVASCULAR:  Regular rate and rhythm without murmur, rub or gallop. ABDOMEN:  Soft, non-tender, with active bowel sounds, and no hepatosplenomegaly.  No masses. SKIN:  No rashes, ulcers or lesions. EXTREMITIES: No edema, no skin discoloration or tenderness.  No palpable cords. LYMPH NODES: No palpable cervical, supraclavicular, axillary or inguinal adenopathy  NEUROLOGICAL: Unremarkable. PSYCH:  Appropriate.   Appointment on 06/15/2016  Component Date Value Ref Range Status  . WBC 06/15/2016 8.3  3.8 - 10.6 K/uL Final  . RBC 06/15/2016 6.69* 4.40 - 5.90 MIL/uL Final  . Hemoglobin 06/15/2016 16.0  13.0 - 18.0 g/dL Final  . HCT 06/15/2016 48.1  40.0 - 52.0 % Final  . MCV 06/15/2016 71.9* 80.0 - 100.0 fL Final  . MCH 06/15/2016 24.0* 26.0 - 34.0 pg Final  .  MCHC 06/15/2016 33.3  32.0 - 36.0 g/dL Final  . RDW 06/15/2016 17.3* 11.5 - 14.5 % Final  . Platelets 06/15/2016 261  150 - 440 K/uL Final  . Neutrophils Relative % 06/15/2016 47   Final  . Neutro Abs 06/15/2016 4.0  1.4 - 6.5 K/uL Final  . Lymphocytes Relative 06/15/2016 31   Final  . Lymphs Abs 06/15/2016 2.5  1.0 - 3.6 K/uL Final  . Monocytes Relative 06/15/2016 9   Final  . Monocytes Absolute 06/15/2016 0.7  0.2 - 1.0 K/uL Final  . Eosinophils Relative 06/15/2016 12   Final  . Eosinophils Absolute 06/15/2016 1.0* 0 - 0.7 K/uL Final  . Basophils Relative 06/15/2016 1   Final  . Basophils Absolute 06/15/2016 0.1  0 - 0.1 K/uL Final    Assessment:  Benjamin [name] is a 65 y.o. 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  Red cell volume study at [facility] on 03/12/2004 revealed a normal red cell mass and no evidence of polycythemia vera.  BCR-ABL and JAK2 V617F were negative in 06/2010.  Epo level was 37.8.  Additional testing noted an epo of 12.9, LAP score 108, B12 of 501, and ferritin 321.  Abdominal ultrasound revealed no splenomegaly.  He denies any tobacco or testosterone use.   He has sleep apnea and uses CPAP.  He initially underwent phlebotomy every 2 weeks.  He now undergoes phlebotomy every 2 months. He undergoes a phlebotomy of 400 cc if his hematocrit is > 47. Last phlebotomy was  on 04/20/2016 for a hematocrit of HCT 50.1 and hemoglobin 16.4.  He becomes more fatigued when he needs a phlebotomy.  He denies any history of thrombosis.  Diet is normal.  Colonoscopy on 06/02/2015 by Dr. Arther Dames revealed 2 polyps (6 and 20 mm) in the cecum.  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  Symptomatically, he denies any complaints.  Exam is unremarkable.  Plan: 1.  Discuss medical history, diagnosis and management of erythrocytosis.  Discuss patient's current treatment plan of phlebotomy (400 cc) if hematocrit is > 47. 2.  Labs today:  CBC with diff, ferritin, iron studies, epo level, JAK2 with reflex. 3.  Phlebotomy today 4.  RTC every 2 months x 2 for CBC +/- phlebotomy 5.  RTC in 6 months for MD assess, labs (CBC with diff, CMP, ferritin) +/- phlebotomy   Lequita Asal, MD  06/15/2016, 11:33 AM

## 2016-06-15 NOTE — Progress Notes (Signed)
No changes since last visit.  Mild fatigue reported relates to again

## 2016-06-17 ENCOUNTER — Ambulatory Visit: Payer: BLUE CROSS/BLUE SHIELD | Admitting: Hematology and Oncology

## 2016-06-17 ENCOUNTER — Other Ambulatory Visit: Payer: BLUE CROSS/BLUE SHIELD

## 2016-06-24 LAB — JAK2  V617F QUAL. WITH REFLEX TO EXON 12

## 2016-06-24 LAB — JAK2 EXON 12 MUTATION ANALYSIS

## 2016-06-24 LAB — ERYTHROPOIETIN: Erythropoietin: 12.8 m[IU]/mL (ref 2.6–18.5)

## 2016-07-15 ENCOUNTER — Other Ambulatory Visit: Payer: Medicare HMO

## 2016-08-16 ENCOUNTER — Other Ambulatory Visit: Payer: Self-pay | Admitting: Hematology and Oncology

## 2016-08-16 ENCOUNTER — Inpatient Hospital Stay: Payer: Medicare HMO | Attending: Hematology and Oncology

## 2016-08-16 ENCOUNTER — Inpatient Hospital Stay: Payer: Medicare HMO

## 2016-08-16 ENCOUNTER — Encounter (INDEPENDENT_AMBULATORY_CARE_PROVIDER_SITE_OTHER): Payer: Self-pay

## 2016-08-16 DIAGNOSIS — D751 Secondary polycythemia: Secondary | ICD-10-CM

## 2016-08-16 LAB — CBC
HCT: 49.3 % (ref 40.0–52.0)
Hemoglobin: 16.2 g/dL (ref 13.0–18.0)
MCH: 23.8 pg — ABNORMAL LOW (ref 26.0–34.0)
MCHC: 32.8 g/dL (ref 32.0–36.0)
MCV: 72.7 fL — ABNORMAL LOW (ref 80.0–100.0)
Platelets: 298 10*3/uL (ref 150–440)
RBC: 6.78 MIL/uL — ABNORMAL HIGH (ref 4.40–5.90)
RDW: 17 % — ABNORMAL HIGH (ref 11.5–14.5)
WBC: 9.5 10*3/uL (ref 3.8–10.6)

## 2016-08-16 LAB — FERRITIN: Ferritin: 14 ng/mL — ABNORMAL LOW (ref 24–336)

## 2016-08-17 ENCOUNTER — Other Ambulatory Visit: Payer: Self-pay | Admitting: Hematology and Oncology

## 2016-08-17 ENCOUNTER — Encounter: Payer: Self-pay | Admitting: Hematology and Oncology

## 2016-08-17 ENCOUNTER — Other Ambulatory Visit: Payer: Self-pay | Admitting: *Deleted

## 2016-08-17 ENCOUNTER — Other Ambulatory Visit: Payer: Medicare HMO

## 2016-08-17 DIAGNOSIS — D751 Secondary polycythemia: Secondary | ICD-10-CM

## 2016-09-14 ENCOUNTER — Other Ambulatory Visit: Payer: Medicare HMO

## 2016-10-12 ENCOUNTER — Other Ambulatory Visit: Payer: Self-pay

## 2016-10-12 ENCOUNTER — Inpatient Hospital Stay: Payer: Medicare HMO

## 2016-10-12 ENCOUNTER — Other Ambulatory Visit: Payer: Self-pay | Admitting: Hematology and Oncology

## 2016-10-12 ENCOUNTER — Inpatient Hospital Stay: Payer: Medicare HMO | Attending: Hematology and Oncology

## 2016-10-12 DIAGNOSIS — D751 Secondary polycythemia: Secondary | ICD-10-CM | POA: Insufficient documentation

## 2016-10-12 LAB — CBC
HCT: 49 % (ref 40.0–52.0)
Hemoglobin: 16 g/dL (ref 13.0–18.0)
MCH: 23.9 pg — ABNORMAL LOW (ref 26.0–34.0)
MCHC: 32.7 g/dL (ref 32.0–36.0)
MCV: 73.2 fL — ABNORMAL LOW (ref 80.0–100.0)
Platelets: 279 10*3/uL (ref 150–440)
RBC: 6.7 MIL/uL — ABNORMAL HIGH (ref 4.40–5.90)
RDW: 16.5 % — ABNORMAL HIGH (ref 11.5–14.5)
WBC: 9.1 10*3/uL (ref 3.8–10.6)

## 2016-10-12 LAB — FERRITIN: FERRITIN: 14 ng/mL — AB (ref 24–336)

## 2016-12-16 ENCOUNTER — Other Ambulatory Visit: Payer: Medicare HMO

## 2016-12-16 ENCOUNTER — Ambulatory Visit: Payer: Medicare HMO | Admitting: Internal Medicine

## 2016-12-24 ENCOUNTER — Inpatient Hospital Stay: Payer: Medicare HMO

## 2016-12-30 ENCOUNTER — Ambulatory Visit: Payer: Medicare HMO | Admitting: Hematology and Oncology

## 2016-12-30 ENCOUNTER — Other Ambulatory Visit: Payer: Medicare HMO

## 2017-01-06 ENCOUNTER — Inpatient Hospital Stay (HOSPITAL_BASED_OUTPATIENT_CLINIC_OR_DEPARTMENT_OTHER): Payer: Medicare HMO | Admitting: Hematology and Oncology

## 2017-01-06 ENCOUNTER — Inpatient Hospital Stay: Payer: Medicare HMO

## 2017-01-06 ENCOUNTER — Inpatient Hospital Stay: Payer: Medicare HMO | Attending: Hematology and Oncology

## 2017-01-06 VITALS — BP 136/88 | HR 106 | Temp 96.5°F | Resp 18 | Wt 233.2 lb

## 2017-01-06 DIAGNOSIS — F419 Anxiety disorder, unspecified: Secondary | ICD-10-CM

## 2017-01-06 DIAGNOSIS — M129 Arthropathy, unspecified: Secondary | ICD-10-CM

## 2017-01-06 DIAGNOSIS — Z7984 Long term (current) use of oral hypoglycemic drugs: Secondary | ICD-10-CM | POA: Insufficient documentation

## 2017-01-06 DIAGNOSIS — Z86010 Personal history of colonic polyps: Secondary | ICD-10-CM

## 2017-01-06 DIAGNOSIS — R5383 Other fatigue: Secondary | ICD-10-CM | POA: Diagnosis not present

## 2017-01-06 DIAGNOSIS — D751 Secondary polycythemia: Secondary | ICD-10-CM

## 2017-01-06 DIAGNOSIS — Z79899 Other long term (current) drug therapy: Secondary | ICD-10-CM | POA: Diagnosis not present

## 2017-01-06 DIAGNOSIS — E119 Type 2 diabetes mellitus without complications: Secondary | ICD-10-CM | POA: Diagnosis not present

## 2017-01-06 DIAGNOSIS — G4730 Sleep apnea, unspecified: Secondary | ICD-10-CM

## 2017-01-06 LAB — CBC WITH DIFFERENTIAL/PLATELET
Basophils Absolute: 0.1 10*3/uL (ref 0–0.1)
Basophils Relative: 1 %
Eosinophils Absolute: 0.3 10*3/uL (ref 0–0.7)
Eosinophils Relative: 3 %
HCT: 48.8 % (ref 40.0–52.0)
Hemoglobin: 15.9 g/dL (ref 13.0–18.0)
Lymphocytes Relative: 33 %
Lymphs Abs: 3.1 10*3/uL (ref 1.0–3.6)
MCH: 23.5 pg — ABNORMAL LOW (ref 26.0–34.0)
MCHC: 32.7 g/dL (ref 32.0–36.0)
MCV: 71.9 fL — ABNORMAL LOW (ref 80.0–100.0)
Monocytes Absolute: 0.9 10*3/uL (ref 0.2–1.0)
Monocytes Relative: 9 %
Neutro Abs: 5.2 10*3/uL (ref 1.4–6.5)
Neutrophils Relative %: 54 %
Platelets: 279 10*3/uL (ref 150–440)
RBC: 6.78 MIL/uL — ABNORMAL HIGH (ref 4.40–5.90)
RDW: 16.9 % — ABNORMAL HIGH (ref 11.5–14.5)
WBC: 9.5 10*3/uL (ref 3.8–10.6)

## 2017-01-06 LAB — COMPREHENSIVE METABOLIC PANEL
ALT: 26 U/L (ref 17–63)
AST: 31 U/L (ref 15–41)
Albumin: 4.6 g/dL (ref 3.5–5.0)
Alkaline Phosphatase: 65 U/L (ref 38–126)
Anion gap: 9 (ref 5–15)
BUN: 15 mg/dL (ref 6–20)
CO2: 23 mmol/L (ref 22–32)
Calcium: 9.4 mg/dL (ref 8.9–10.3)
Chloride: 101 mmol/L (ref 101–111)
Creatinine, Ser: 0.93 mg/dL (ref 0.61–1.24)
GFR calc Af Amer: >60 mL/min (ref >60)
GFR calc non Af Amer: >60 mL/min (ref >60)
Glucose, Bld: 141 mg/dL — ABNORMAL HIGH (ref 65–99)
Potassium: 3.7 mmol/L (ref 3.5–5.1)
Sodium: 133 mmol/L — ABNORMAL LOW (ref 135–145)
Total Bilirubin: 0.8 mg/dL (ref 0.3–1.2)
Total Protein: 7.6 g/dL (ref 6.5–8.1)

## 2017-01-06 LAB — FERRITIN: Ferritin: 14 ng/mL — ABNORMAL LOW (ref 24–336)

## 2017-01-06 NOTE — Progress Notes (Signed)
[facility] [facility] day:  01/06/17  Chief Complaint: Benjamin [name] is a 66 y.o. male with erythrocytosis who is seen for 6 month assessment.  HPI: The patient was last seen in the hematology clinic on 05/28/2016.  At that time, he was seen for initial assessment by me.  He denied any complaints.  Exam was unremarkable.  Labs on 06/15/2016 revealed a hematocrit of 48.1, hemoglobin 16.0, MCV 71.9, platelets 261,000, WBC 8300 with an ANC of 4000.  Ferritin was 19.  Iron studies included a saturation of 4% with a TIBC of 462 (high).  Epo level was 12.8.  JAK2 V617F and exons 12-15 were negative.  Hematocrit was 49.3 on 08/16/2016 and 49 on 10/12/2016.  He underwent phlebotomy.  Ferritin was 14 on 10/12/2016.  Symptomatically, he feels "good".  He denies any problems.  He has a little fatigue.   Past Medical History:  Diagnosis Date  . Anxiety   . Arthritis   . Diabetes mellitus without complication ([place])   . Erythrocytosis 05/18/2015  . Erythrocytosis   . Polycythemia   . Sleep apnea     Past Surgical History:  Procedure Laterality Date  . cataracts    . COLONOSCOPY N/A 06/02/2015   Procedure: COLONOSCOPY;  Surgeon: [name], MD;  Location: [facility] ENDOSCOPY;  Service: Endoscopy;  Laterality: N/A;    Family History  Problem Relation Age of Onset  . Heart disease Mother   . Diabetes Father     Social History:  reports that he has never smoked. He has never used smokeless tobacco. He reports that he does not drink alcohol or use drugs.  He is retired.  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  He lives in [place].  The patient is alone today.  Allergies:  Allergies  Allergen Reactions  . Sertraline Hcl     Current Medications: Current Outpatient Prescriptions  Medication Sig Dispense Refill  . acetaminophen (TYLENOL) 500 MG tablet Take 1,000 mg by mouth every 6 (six) hours as needed for mild pain.     . canagliflozin (INVOKANA) 100 MG TABS tablet Take 100 mg by mouth daily.    . cetirizine (ZYRTEC) 10 MG tablet Take 10 mg by mouth daily as needed for allergies. Reported on 06/15/2016    . glipiZIDE (GLUCOTROL) 5 MG tablet Take 5 mg by mouth 2 (two) times daily.    Marland Kitchen HYDROcodone-acetaminophen (NORCO/VICODIN) 5-325 MG per tablet Take 1 tablet by mouth every 6 (six) hours as needed for moderate pain. Reported on 06/15/2016    . hydrocortisone 2.5 % cream Apply topically. Reported on 06/15/2016    . LORazepam (ATIVAN) 1 MG tablet Take 1 tablet (1 mg total) by mouth daily. 15 tablet 0  . metFORMIN (GLUCOPHAGE-XR) 500 MG 24 hr tablet Take 1,000 mg by mouth 2 (two) times daily.    . pravastatin (PRAVACHOL) 10 MG tablet Take 10 mg by mouth daily.    . saxagliptin HCl (ONGLYZA) 5 MG TABS tablet Take 5 mg by mouth daily.     No current facility-administered medications for this visit.     Review of Systems:  GENERAL:  Feels good.  Little fatigue.  No fevers or sweats.  Weight stable. PERFORMANCE STATUS (ECOG):  0 HEENT:  No visual changes, runny nose, sore throat, mouth sores or tenderness. Lungs: No shortness of breath or cough.  No hemoptysis. Cardiac:  No chest pain, palpitations, orthopnea, or PND. GI:  Eating alright.  No nausea, vomiting, diarrhea, constipation, melena or hematochezia. GU:  No urgency, frequency, dysuria, or hematuria. Musculoskeletal:  No back pain.  No joint pain.  No muscle tenderness. Extremities:  No pain or swelling. Skin:  No rashes or skin changes. Neuro:  No headache, numbness or weakness, balance or coordination issues. Endocrine:  Diabetes.  No thyroid issues, hot flashes or night sweats. Psych:  No mood changes, depression or anxiety. Pain:  No focal pain. Review of systems:  All other systems reviewed and found to be negative.  Physical Exam: Blood pressure 136/88, pulse (!) 106, temperature (!) 96.5 F (35.8 C), temperature source Tympanic, resp. rate 18,  weight 233 lb 4 oz (105.8 kg). GENERAL:  Well developed, well nourished, sitting comfortably in the exam room in no acute distress. MENTAL STATUS:  Alert and oriented to person, place and time. HEAD:  Short gray hair.  Normocephalic, atraumatic, face symmetric, no Cushingoid features. EYES:  Blue eyes.  Eyes injected, tired.  Pupils equal round and reactive to light and accomodation.  No scleral icterus. ENT:  Oropharynx clear without lesion.  Tongue normal. Mucous membranes moist.  RESPIRATORY:  Clear to auscultation without rales, wheezes or rhonchi. CARDIOVASCULAR:  Regular rate and rhythm without murmur, rub or gallop. ABDOMEN:  Soft, non-tender, with active bowel sounds, and no hepatosplenomegaly.  No masses. SKIN:  No rashes, ulcers or lesions. EXTREMITIES: No edema, no skin discoloration or tenderness.  No palpable cords. LYMPH NODES: No palpable cervical, supraclavicular, axillary or inguinal adenopathy  NEUROLOGICAL: Unremarkable. PSYCH:  Appropriate.   Appointment on 01/06/2017  Component Date Value Ref Range Status  . WBC 01/06/2017 9.5  3.8 - 10.6 K/uL Final  . RBC 01/06/2017 6.78* 4.40 - 5.90 MIL/uL Final  . Hemoglobin 01/06/2017 15.9  13.0 - 18.0 g/dL Final  . HCT 01/06/2017 48.8  40.0 - 52.0 % Final  . MCV 01/06/2017 71.9* 80.0 - 100.0 fL Final  . MCH 01/06/2017 23.5* 26.0 - 34.0 pg Final  . MCHC 01/06/2017 32.7  32.0 - 36.0 g/dL Final  . RDW 01/06/2017 16.9* 11.5 - 14.5 % Final  . Platelets 01/06/2017 279  150 - 440 K/uL Final  . Neutrophils Relative % 01/06/2017 54  % Final  . Neutro Abs 01/06/2017 5.2  1.4 - 6.5 K/uL Final  . Lymphocytes Relative 01/06/2017 33  % Final  . Lymphs Abs 01/06/2017 3.1  1.0 - 3.6 K/uL Final  . Monocytes Relative 01/06/2017 9  % Final  . Monocytes Absolute 01/06/2017 0.9  0.2 - 1.0 K/uL Final  . Eosinophils Relative 01/06/2017 3  % Final  . Eosinophils Absolute 01/06/2017 0.3  0 - 0.7 K/uL Final  . Basophils Relative 01/06/2017 1  %  Final  . Basophils Absolute 01/06/2017 0.1  0 - 0.1 K/uL Final  . Sodium 01/06/2017 133* 135 - 145 mmol/L Final  . Potassium 01/06/2017 3.7  3.5 - 5.1 mmol/L Final  . Chloride 01/06/2017 101  101 - 111 mmol/L Final  . CO2 01/06/2017 23  22 - 32 mmol/L Final  . Glucose, Bld 01/06/2017 141* 65 - 99 mg/dL Final  . BUN 01/06/2017 15  6 - 20 mg/dL Final  . Creatinine, Ser 01/06/2017 0.93  0.61 - 1.24 mg/dL Final  . Calcium 01/06/2017 9.4  8.9 - 10.3 mg/dL Final  . Total Protein 01/06/2017 7.6  6.5 - 8.1 g/dL Final  . Albumin 01/06/2017 4.6  3.5 - 5.0 g/dL Final  . AST 01/06/2017 31  15 -  41 U/L Final  . ALT 01/06/2017 26  17 - 63 U/L Final  . Alkaline Phosphatase 01/06/2017 65  38 - 126 U/L Final  . Total Bilirubin 01/06/2017 0.8  0.3 - 1.2 mg/dL Final  . GFR calc non Af Amer 01/06/2017 >60  >60 mL/min Final  . GFR calc Af Amer 01/06/2017 >60  >60 mL/min Final   Comment: (NOTE) The eGFR has been calculated using the CKD EPI equation. This calculation has not been validated in all clinical situations. eGFR's persistently <60 mL/min signify possible Chronic Kidney Disease.   . Anion gap 01/06/2017 9  5 - 15 Final  . Ferritin 01/06/2017 14* 24 - 336 ng/mL Final    Assessment:  Benjamin [name] is a 66 y.o. 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  Red cell volume study at [facility] on 03/12/2004 revealed a normal red cell mass and no evidence of polycythemia vera.  BCR-ABL and JAK2 V617F were negative in 06/2010.  Epo level was 37.8.  Additional testing noted an epo of 12.9, LAP score 108, B12 of 501, and ferritin 321.  Abdominal ultrasound revealed no splenomegaly.  He denies any tobacco or testosterone use.  He has sleep apnea and uses CPAP.  He initially underwent phlebotomy every 2 weeks.  He now undergoes phlebotomy every 2 months. He undergoes a phlebotomy of 400 cc if his hematocrit is > 47.  Last phlebotomy was  on 04/20/2016 for a hematocrit of HCT 50.1 and hemoglobin 16.4.  He becomes more  fatigued when he needs a phlebotomy.  He denies any history of thrombosis.  Labs on 06/15/2016 revealed a hematocrit of 48.1, hemoglobin 16.0, MCV 71.9, platelets 261,000, WBC 8300 with an ANC of 4000.  Ferritin was 19.  Iron studies revealed a saturation of 4% and TIBC of 462.  Epo level was 12.8 (normal).   JAK2 V617F and exon 12-15 were negative.  Diet is normal.  Colonoscopy on 06/02/2015 revealed 2 polyps (6 and 20 mm) in the cecum.  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  Symptomatically, he denies any complaints.  Exam is unremarkable.  Hematocrit is 48.8.  Plan: 1.  Labs today:  CBC with diff, CMP, ferritin. 2.  Review labs from last visit. 3.  Phlebotomy today. 4.  RTC every 2 months x 2 for CBC +/- phlebotomy 5.  RTC in 6 months for MD assessment, labs (CBC with diff, CMP, ferritin) +/- phlebotomy   Lequita Asal, MD  01/06/2017, 3:38 PM

## 2017-01-06 NOTE — Progress Notes (Signed)
Patient offers no complaints today. 

## 2017-02-27 ENCOUNTER — Encounter: Payer: Self-pay | Admitting: Hematology and Oncology

## 2017-03-07 ENCOUNTER — Inpatient Hospital Stay: Payer: Medicare HMO

## 2017-03-07 ENCOUNTER — Inpatient Hospital Stay: Payer: Medicare HMO | Attending: Hematology and Oncology

## 2017-03-07 DIAGNOSIS — D751 Secondary polycythemia: Secondary | ICD-10-CM

## 2017-03-07 LAB — CBC WITH DIFFERENTIAL/PLATELET
Basophils Absolute: 0.1 10*3/uL (ref 0–0.1)
Basophils Relative: 1 %
Eosinophils Absolute: 0.2 10*3/uL (ref 0–0.7)
Eosinophils Relative: 2 %
HCT: 49.1 % (ref 40.0–52.0)
Hemoglobin: 16.2 g/dL (ref 13.0–18.0)
Lymphocytes Relative: 30 %
Lymphs Abs: 2.9 10*3/uL (ref 1.0–3.6)
MCH: 24.1 pg — ABNORMAL LOW (ref 26.0–34.0)
MCHC: 33 g/dL (ref 32.0–36.0)
MCV: 73.1 fL — ABNORMAL LOW (ref 80.0–100.0)
Monocytes Absolute: 0.9 10*3/uL (ref 0.2–1.0)
Monocytes Relative: 9 %
Neutro Abs: 5.7 10*3/uL (ref 1.4–6.5)
Neutrophils Relative %: 58 %
Platelets: 331 10*3/uL (ref 150–440)
RBC: 6.72 MIL/uL — ABNORMAL HIGH (ref 4.40–5.90)
RDW: 16.6 % — ABNORMAL HIGH (ref 11.5–14.5)
WBC: 9.8 10*3/uL (ref 3.8–10.6)

## 2017-05-09 ENCOUNTER — Inpatient Hospital Stay: Payer: Medicare HMO | Attending: Hematology and Oncology

## 2017-05-09 ENCOUNTER — Inpatient Hospital Stay: Payer: Medicare HMO

## 2017-05-09 DIAGNOSIS — R7989 Other specified abnormal findings of blood chemistry: Secondary | ICD-10-CM | POA: Insufficient documentation

## 2017-05-09 DIAGNOSIS — D751 Secondary polycythemia: Secondary | ICD-10-CM | POA: Diagnosis present

## 2017-05-09 LAB — FERRITIN: Ferritin: 15 ng/mL — ABNORMAL LOW (ref 24–336)

## 2017-05-09 LAB — CBC WITH DIFFERENTIAL/PLATELET
Basophils Absolute: 0.1 10*3/uL (ref 0–0.1)
Basophils Relative: 1 %
Eosinophils Absolute: 0.1 10*3/uL (ref 0–0.7)
Eosinophils Relative: 2 %
HCT: 46.7 % (ref 40.0–52.0)
Hemoglobin: 15.6 g/dL (ref 13.0–18.0)
Lymphocytes Relative: 29 %
Lymphs Abs: 2.5 10*3/uL (ref 1.0–3.6)
MCH: 24.1 pg — ABNORMAL LOW (ref 26.0–34.0)
MCHC: 33.4 g/dL (ref 32.0–36.0)
MCV: 72.3 fL — ABNORMAL LOW (ref 80.0–100.0)
Monocytes Absolute: 0.8 10*3/uL (ref 0.2–1.0)
Monocytes Relative: 10 %
Neutro Abs: 4.9 10*3/uL (ref 1.4–6.5)
Neutrophils Relative %: 58 %
Platelets: 291 10*3/uL (ref 150–440)
RBC: 6.47 MIL/uL — ABNORMAL HIGH (ref 4.40–5.90)
RDW: 16.1 % — ABNORMAL HIGH (ref 11.5–14.5)
WBC: 8.4 10*3/uL (ref 3.8–10.6)

## 2017-07-11 ENCOUNTER — Encounter: Payer: Self-pay | Admitting: Hematology and Oncology

## 2017-07-11 ENCOUNTER — Inpatient Hospital Stay: Payer: Medicare HMO | Attending: Hematology and Oncology | Admitting: Hematology and Oncology

## 2017-07-11 ENCOUNTER — Inpatient Hospital Stay: Payer: Medicare HMO

## 2017-07-11 VITALS — BP 124/84 | HR 101 | Temp 98.4°F | Resp 18 | Wt 227.1 lb

## 2017-07-11 VITALS — BP 118/76 | HR 105 | Resp 22

## 2017-07-11 DIAGNOSIS — D751 Secondary polycythemia: Secondary | ICD-10-CM

## 2017-07-11 DIAGNOSIS — M129 Arthropathy, unspecified: Secondary | ICD-10-CM

## 2017-07-11 DIAGNOSIS — F419 Anxiety disorder, unspecified: Secondary | ICD-10-CM | POA: Insufficient documentation

## 2017-07-11 DIAGNOSIS — Z86010 Personal history of colonic polyps: Secondary | ICD-10-CM | POA: Diagnosis not present

## 2017-07-11 DIAGNOSIS — G4730 Sleep apnea, unspecified: Secondary | ICD-10-CM | POA: Diagnosis not present

## 2017-07-11 DIAGNOSIS — Z7984 Long term (current) use of oral hypoglycemic drugs: Secondary | ICD-10-CM | POA: Diagnosis not present

## 2017-07-11 DIAGNOSIS — E119 Type 2 diabetes mellitus without complications: Secondary | ICD-10-CM | POA: Diagnosis not present

## 2017-07-11 LAB — COMPREHENSIVE METABOLIC PANEL
ALT: 22 U/L (ref 17–63)
AST: 28 U/L (ref 15–41)
Albumin: 4 g/dL (ref 3.5–5.0)
Alkaline Phosphatase: 56 U/L (ref 38–126)
Anion gap: 11 (ref 5–15)
BUN: 13 mg/dL (ref 6–20)
CO2: 20 mmol/L — ABNORMAL LOW (ref 22–32)
Calcium: 9.5 mg/dL (ref 8.9–10.3)
Chloride: 103 mmol/L (ref 101–111)
Creatinine, Ser: 1.03 mg/dL (ref 0.61–1.24)
GFR calc Af Amer: >60 mL/min (ref >60)
GFR calc non Af Amer: >60 mL/min (ref >60)
Glucose, Bld: 238 mg/dL — ABNORMAL HIGH (ref 65–99)
Potassium: 3.8 mmol/L (ref 3.5–5.1)
Sodium: 134 mmol/L — ABNORMAL LOW (ref 135–145)
Total Bilirubin: 0.9 mg/dL (ref 0.3–1.2)
Total Protein: 7.2 g/dL (ref 6.5–8.1)

## 2017-07-11 LAB — CBC WITH DIFFERENTIAL/PLATELET
Basophils Absolute: 0.1 10*3/uL (ref 0–0.1)
Basophils Relative: 1 %
Eosinophils Absolute: 0.2 10*3/uL (ref 0–0.7)
Eosinophils Relative: 2 %
HCT: 48.8 % (ref 40.0–52.0)
Hemoglobin: 16.3 g/dL (ref 13.0–18.0)
Lymphocytes Relative: 29 %
Lymphs Abs: 2.7 10*3/uL (ref 1.0–3.6)
MCH: 24.8 pg — ABNORMAL LOW (ref 26.0–34.0)
MCHC: 33.4 g/dL (ref 32.0–36.0)
MCV: 74.2 fL — ABNORMAL LOW (ref 80.0–100.0)
Monocytes Absolute: 0.7 10*3/uL (ref 0.2–1.0)
Monocytes Relative: 7 %
Neutro Abs: 5.7 10*3/uL (ref 1.4–6.5)
Neutrophils Relative %: 61 %
Platelets: 269 10*3/uL (ref 150–440)
RBC: 6.58 MIL/uL — ABNORMAL HIGH (ref 4.40–5.90)
RDW: 17.4 % — ABNORMAL HIGH (ref 11.5–14.5)
WBC: 9.4 10*3/uL (ref 3.8–10.6)

## 2017-07-11 LAB — FERRITIN: Ferritin: 16 ng/mL — ABNORMAL LOW (ref 24–336)

## 2017-07-11 NOTE — Progress Notes (Signed)
[facility] day:  07/11/17  Chief Complaint: Benjamin [name] is a 66 y.o. male with erythrocytosis who is seen for 6 month assessment.  HPI: The patient was last seen in the hematology clinic on 01/06/2017.  At that time, he denied any complaints.  Exam was unremarkable.  Hematocrit was 48.8.  He underwent phlebotomy.  He underwent phlebotomy on 03/07/2017 for a hematocrit of 48.8.  Labs on 05/09/2017 revealed a hematocrit of 46.7, hemoglobin 15.6, and MCV 72.3.  Ferritin was 15.   During the interim, he denies any complaints. He denies any headaches, visual changes or erythromelalgia.   Past Medical History:  Diagnosis Date  . Anxiety   . Arthritis   . Diabetes mellitus without complication ([place])   . Erythrocytosis 05/18/2015  . Erythrocytosis   . Polycythemia   . Sleep apnea     Past Surgical History:  Procedure Laterality Date  . cataracts    . COLONOSCOPY N/A 06/02/2015   Procedure: COLONOSCOPY;  Surgeon: [name], MD;  Location: [facility] ENDOSCOPY;  Service: Endoscopy;  Laterality: N/A;    Family History  Problem Relation Age of Onset  . Heart disease Mother   . Diabetes Father     Social History:  reports that he has never smoked. He has never used smokeless tobacco. He reports that he does not drink alcohol or use drugs.  He is retired.  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  He lives in [place].  The patient is alone today.  Allergies:  Allergies  Allergen Reactions  . Sertraline Hcl     Current Medications: Current Outpatient Prescriptions  Medication Sig Dispense Refill  . acetaminophen (TYLENOL) 500 MG tablet Take 1,000 mg by mouth every 6 (six) hours as needed for mild pain.    . canagliflozin (INVOKANA) 100 MG TABS tablet Take 100 mg by mouth daily.    . cetirizine (ZYRTEC) 10 MG tablet Take 10 mg by mouth daily as needed for allergies. Reported on 06/15/2016    . glipiZIDE  (GLUCOTROL) 5 MG tablet Take 5 mg by mouth 2 (two) times daily.    Marland Kitchen HYDROcodone-acetaminophen (NORCO/VICODIN) 5-325 MG per tablet Take 1 tablet by mouth every 6 (six) hours as needed for moderate pain. Reported on 06/15/2016    . LORazepam (ATIVAN) 1 MG tablet Take 1 tablet (1 mg total) by mouth daily. 15 tablet 0  . metFORMIN (GLUCOPHAGE-XR) 500 MG 24 hr tablet Take 1,000 mg by mouth 2 (two) times daily.    . pravastatin (PRAVACHOL) 10 MG tablet Take 10 mg by mouth daily.    . saxagliptin HCl (ONGLYZA) 5 MG TABS tablet Take 5 mg by mouth daily.     No current facility-administered medications for this visit.     Review of Systems:  GENERAL:  Feels good.  No complaints.  No fevers or sweats.  Weight down. PERFORMANCE STATUS (ECOG):  0 HEENT:  No visual changes, runny nose, sore throat, mouth sores or tenderness. Lungs: No shortness of breath or cough.  No hemoptysis. Cardiac:  No chest pain, palpitations, orthopnea, or PND. GI:  No nausea, vomiting, diarrhea, constipation, melena or hematochezia. GU:  No urgency, frequency, dysuria, or hematuria. Musculoskeletal:  No back pain.  No joint pain.  No muscle tenderness. Extremities:  No pain or swelling. Skin:  No rashes or skin changes. Neuro:  No headache, numbness or weakness, balance or coordination issues. Endocrine:  Diabetes.  No thyroid issues, hot flashes or night sweats. Psych:  No mood changes, depression or anxiety. Pain:  No focal pain. Review of systems:  All other systems reviewed and found to be negative.  Physical Exam: Blood pressure 124/84, pulse (!) 101, temperature 98.4 F (36.9 C), temperature source Tympanic, resp. rate 18, weight 227 lb 1 oz (103 kg). GENERAL:  Well developed, well nourished, gentleman sitting comfortably in the exam room in no acute distress. MENTAL STATUS:  Alert and oriented to person, place and time. HEAD:  Short gray hair.  Normocephalic, atraumatic, face symmetric, no Cushingoid  features. EYES:  Blue eyes.  Eyes injected, tired.  Pupils equal round and reactive to light and accomodation.  No scleral icterus. ENT:  Oropharynx clear without lesion.  Tongue normal. Mucous membranes moist.  RESPIRATORY:  Clear to auscultation without rales, wheezes or rhonchi. CARDIOVASCULAR:  Regular rate and rhythm without murmur, rub or gallop. ABDOMEN:  Soft, non-tender, with active bowel sounds, and no hepatosplenomegaly.  No masses. SKIN:  No rashes, ulcers or lesions. EXTREMITIES: No edema, no skin discoloration or tenderness.  No palpable cords. LYMPH NODES: No palpable cervical, supraclavicular, axillary or inguinal adenopathy  NEUROLOGICAL: Unremarkable. PSYCH:  Appropriate.   Clinical Support on 07/11/2017  Component Date Value Ref Range Status  . WBC 07/11/2017 9.4  3.8 - 10.6 K/uL Final  . RBC 07/11/2017 6.58* 4.40 - 5.90 MIL/uL Final  . Hemoglobin 07/11/2017 16.3  13.0 - 18.0 g/dL Final  . HCT 07/11/2017 48.8  40.0 - 52.0 % Final  . MCV 07/11/2017 74.2* 80.0 - 100.0 fL Final  . MCH 07/11/2017 24.8* 26.0 - 34.0 pg Final  . MCHC 07/11/2017 33.4  32.0 - 36.0 g/dL Final  . RDW 07/11/2017 17.4* 11.5 - 14.5 % Final  . Platelets 07/11/2017 269  150 - 440 K/uL Final  . Neutrophils Relative % 07/11/2017 61  % Final  . Neutro Abs 07/11/2017 5.7  1.4 - 6.5 K/uL Final  . Lymphocytes Relative 07/11/2017 29  % Final  . Lymphs Abs 07/11/2017 2.7  1.0 - 3.6 K/uL Final  . Monocytes Relative 07/11/2017 7  % Final  . Monocytes Absolute 07/11/2017 0.7  0.2 - 1.0 K/uL Final  . Eosinophils Relative 07/11/2017 2  % Final  . Eosinophils Absolute 07/11/2017 0.2  0 - 0.7 K/uL Final  . Basophils Relative 07/11/2017 1  % Final  . Basophils Absolute 07/11/2017 0.1  0 - 0.1 K/uL Final  . Sodium 07/11/2017 134* 135 - 145 mmol/L Final  . Potassium 07/11/2017 3.8  3.5 - 5.1 mmol/L Final  . Chloride 07/11/2017 103  101 - 111 mmol/L Final  . CO2 07/11/2017 20* 22 - 32 mmol/L Final  . Glucose,  Bld 07/11/2017 238* 65 - 99 mg/dL Final  . BUN 07/11/2017 13  6 - 20 mg/dL Final  . Creatinine, Ser 07/11/2017 1.03  0.61 - 1.24 mg/dL Final  . Calcium 07/11/2017 9.5  8.9 - 10.3 mg/dL Final  . Total Protein 07/11/2017 7.2  6.5 - 8.1 g/dL Final  . Albumin 07/11/2017 4.0  3.5 - 5.0 g/dL Final  . AST 07/11/2017 28  15 - 41 U/L Final  . ALT 07/11/2017 22  17 - 63 U/L Final  . Alkaline Phosphatase 07/11/2017 56  38 - 126 U/L Final  . Total Bilirubin 07/11/2017 0.9  0.3 - 1.2 mg/dL Final  . GFR calc non Af Amer 07/11/2017 >60  >60 mL/min Final  . GFR calc Af Amer 07/11/2017 >60  >  60 mL/min Final   Comment: (NOTE) The eGFR has been calculated using the CKD EPI equation. This calculation has not been validated in all clinical situations. eGFR's persistently <60 mL/min signify possible Chronic Kidney Disease.   . Anion gap 07/11/2017 11  5 - 15 Final    Assessment:  KEITON COSMA is a 66 y.o. 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  Red cell volume study at [facility] on 03/12/2004 revealed a normal red cell mass and no evidence of polycythemia vera.  BCR-ABL and JAK2 V617F were negative in 06/2010.  Epo level was 37.8.  Additional testing noted an epo of 12.9, LAP score 108, B12 of 501, and ferritin 321.  Abdominal ultrasound revealed no splenomegaly.  He denies any tobacco or testosterone use.  He has sleep apnea and uses CPAP.  He initially underwent phlebotomy every 2 weeks.  He now undergoes phlebotomy every 2 months. He undergoes a phlebotomy of 400 cc if his hematocrit is > 47.  Last phlebotomy was on 03/07/2017 for a hematocrit of HCT 48.8 and hemoglobin 16.2.  He becomes more fatigued when he needs a phlebotomy.  He denies any history of thrombosis.  Labs on 06/15/2016 revealed a hematocrit of 48.1, hemoglobin 16.0, MCV 71.9, platelets 261,000, WBC 8300 with an ANC of 4000.  Ferritin was 19.  Iron studies revealed a saturation of 4% and TIBC of 462.  Epo level was 12.8 (normal).   JAK2 V617F and  exon 12-15 were negative.  Diet is normal.  Colonoscopy on 06/02/2015 revealed 2 polyps (6 and 20 mm) in the cecum.  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  Symptomatically, he denies any complaints.  Exam is unremarkable.  Hematocrit is 48.8.  Plan: 1.  Labs today:  CBC with diff, CMP, ferritin. 2.  Review labs from last visit. 3.  Phlebotomy today.  He notes improvement in symptoms after phlebotomy. 4.  RTC every 2 months x 2 for CBC +/- phlebotomy. 5.  RTC in 6 months for MD assessment, labs (CBC with diff, CMP, ferritin) +/- phlebotomy.   Lequita Asal, MD  07/11/2017, 1:47 PM

## 2017-07-11 NOTE — Progress Notes (Signed)
Patient offers no complaints today. 

## 2017-07-11 NOTE — Progress Notes (Signed)
At 1415, 20g needle inserted to right AC with good blood return for therapeutic phlebotomy as ordered.  At 1435, 400 mL of blood had been collected.  PIV removed.  Pt tolerated procedure well and drank a cup of water and ate some cookies prior to leaving the infusion area with stable VS and no complaints.

## 2017-09-12 ENCOUNTER — Other Ambulatory Visit: Payer: Self-pay | Admitting: Hematology and Oncology

## 2017-09-12 ENCOUNTER — Inpatient Hospital Stay: Payer: Medicare HMO | Attending: Hematology and Oncology

## 2017-09-12 ENCOUNTER — Inpatient Hospital Stay: Payer: Medicare HMO

## 2017-09-12 DIAGNOSIS — Z751 Person awaiting admission to adequate facility elsewhere: Secondary | ICD-10-CM | POA: Insufficient documentation

## 2017-09-12 DIAGNOSIS — D751 Secondary polycythemia: Secondary | ICD-10-CM | POA: Diagnosis present

## 2017-09-12 LAB — CBC WITH DIFFERENTIAL/PLATELET
Basophils Absolute: 0.1 10*3/uL (ref 0–0.1)
Basophils Relative: 1 %
Eosinophils Absolute: 0.1 10*3/uL (ref 0–0.7)
Eosinophils Relative: 2 %
HCT: 49.7 % (ref 40.0–52.0)
Hemoglobin: 16.7 g/dL (ref 13.0–18.0)
Lymphocytes Relative: 32 %
Lymphs Abs: 3.1 10*3/uL (ref 1.0–3.6)
MCH: 25.6 pg — ABNORMAL LOW (ref 26.0–34.0)
MCHC: 33.7 g/dL (ref 32.0–36.0)
MCV: 75.8 fL — ABNORMAL LOW (ref 80.0–100.0)
Monocytes Absolute: 0.8 10*3/uL (ref 0.2–1.0)
Monocytes Relative: 9 %
Neutro Abs: 5.6 10*3/uL (ref 1.4–6.5)
Neutrophils Relative %: 56 %
Platelets: 290 10*3/uL (ref 150–440)
RBC: 6.55 MIL/uL — ABNORMAL HIGH (ref 4.40–5.90)
RDW: 16.1 % — ABNORMAL HIGH (ref 11.5–14.5)
WBC: 9.8 10*3/uL (ref 3.8–10.6)

## 2017-10-18 ENCOUNTER — Emergency Department: Payer: Medicare HMO

## 2017-10-18 ENCOUNTER — Encounter: Payer: Self-pay | Admitting: Emergency Medicine

## 2017-10-18 ENCOUNTER — Emergency Department
Admission: EM | Admit: 2017-10-18 | Discharge: 2017-10-18 | Disposition: A | Discharge status: Home / Self Care | Payer: Medicare HMO | Attending: Emergency Medicine | Admitting: Emergency Medicine

## 2017-10-18 DIAGNOSIS — Z79899 Other long term (current) drug therapy: Secondary | ICD-10-CM | POA: Diagnosis not present

## 2017-10-18 DIAGNOSIS — Q6211 Congenital occlusion of ureteropelvic junction: Secondary | ICD-10-CM

## 2017-10-18 DIAGNOSIS — E119 Type 2 diabetes mellitus without complications: Secondary | ICD-10-CM | POA: Insufficient documentation

## 2017-10-18 DIAGNOSIS — Z7984 Long term (current) use of oral hypoglycemic drugs: Secondary | ICD-10-CM | POA: Diagnosis not present

## 2017-10-18 DIAGNOSIS — R109 Unspecified abdominal pain: Secondary | ICD-10-CM

## 2017-10-18 DIAGNOSIS — N130 Hydronephrosis with ureteropelvic junction obstruction: Secondary | ICD-10-CM | POA: Diagnosis not present

## 2017-10-18 DIAGNOSIS — N200 Calculus of kidney: Secondary | ICD-10-CM

## 2017-10-18 LAB — CBC
HEMATOCRIT: 49.5 % (ref 40.0–52.0)
Hemoglobin: 16.2 g/dL (ref 13.0–18.0)
MCH: 25.1 pg — ABNORMAL LOW (ref 26.0–34.0)
MCHC: 32.7 g/dL (ref 32.0–36.0)
MCV: 76.8 fL — AB (ref 80.0–100.0)
PLATELETS: 285 10*3/uL (ref 150–440)
RBC: 6.45 MIL/uL — ABNORMAL HIGH (ref 4.40–5.90)
RDW: 16 % — ABNORMAL HIGH (ref 11.5–14.5)
WBC: 8.6 10*3/uL (ref 3.8–10.6)

## 2017-10-18 LAB — COMPREHENSIVE METABOLIC PANEL
ALK PHOS: 55 U/L (ref 38–126)
ALT: 17 U/L (ref 17–63)
AST: 18 U/L (ref 15–41)
Albumin: 4.2 g/dL (ref 3.5–5.0)
Anion gap: 9 (ref 5–15)
BUN: 14 mg/dL (ref 6–20)
CHLORIDE: 107 mmol/L (ref 101–111)
CO2: 21 mmol/L — AB (ref 22–32)
Calcium: 9.3 mg/dL (ref 8.9–10.3)
Creatinine, Ser: 1.06 mg/dL (ref 0.61–1.24)
GFR calc Af Amer: >60 mL/min (ref >60)
GFR calc non Af Amer: >60 mL/min (ref >60)
GLUCOSE: 199 mg/dL — AB (ref 65–99)
Potassium: 3.9 mmol/L (ref 3.5–5.1)
SODIUM: 137 mmol/L (ref 135–145)
Total Bilirubin: 0.8 mg/dL (ref 0.3–1.2)
Total Protein: 7.6 g/dL (ref 6.5–8.1)

## 2017-10-18 LAB — URINALYSIS, COMPLETE (UACMP) WITH MICROSCOPIC
BACTERIA UA: NONE SEEN
BILIRUBIN URINE: NEGATIVE
Glucose, UA: >=500 mg/dL — AB
HGB URINE DIPSTICK: NEGATIVE
KETONES UR: NEGATIVE mg/dL
LEUKOCYTES UA: NEGATIVE
Nitrite: NEGATIVE
PH: 5 (ref 5.0–8.0)
Protein, ur: NEGATIVE mg/dL
RBC / HPF: NONE SEEN RBC/hpf (ref 0–5)
SQUAMOUS EPITHELIAL / LPF: NONE SEEN
Specific Gravity, Urine: 1.029 (ref 1.005–1.030)

## 2017-10-18 LAB — LIPASE, BLOOD: LIPASE: 216 U/L — AB (ref 11–51)

## 2017-10-18 MED ORDER — ETODOLAC 200 MG PO CAPS: 200.0000 mg | ORAL_CAPSULE | Freq: Three times a day (TID) | ORAL | 0 refills | Status: DC

## 2017-10-18 MED ORDER — MORPHINE SULFATE (PF) 4 MG/ML IV SOLN
4.0000 mg | Freq: Once | INTRAVENOUS | Status: AC
  Administered 2017-10-18: 4 mg via INTRAVENOUS

## 2017-10-18 MED ORDER — MORPHINE SULFATE (PF) 4 MG/ML IV SOLN
INTRAVENOUS | Status: AC
  Filled 2017-10-18: qty 1

## 2017-10-18 MED ORDER — ONDANSETRON HCL 4 MG/2ML IJ SOLN
INTRAMUSCULAR | Status: AC
  Filled 2017-10-18: qty 2

## 2017-10-18 MED ORDER — ONDANSETRON 4 MG PO TBDP: 4.0000 mg | ORAL_TABLET | Freq: Three times a day (TID) | ORAL | 0 refills | Status: DC | PRN

## 2017-10-18 MED ORDER — MORPHINE SULFATE (PF) 4 MG/ML IV SOLN
INTRAVENOUS | Status: AC
  Administered 2017-10-18: 4 mg via INTRAVENOUS
  Filled 2017-10-18: qty 1

## 2017-10-18 MED ORDER — OXYCODONE-ACETAMINOPHEN 5-325 MG PO TABS
2.0000 | ORAL_TABLET | Freq: Once | ORAL | Status: AC
  Administered 2017-10-18: 2 via ORAL
  Filled 2017-10-18: qty 2

## 2017-10-18 MED ORDER — ONDANSETRON HCL 4 MG/2ML IJ SOLN
4.0000 mg | Freq: Once | INTRAMUSCULAR | Status: AC
  Administered 2017-10-18: 4 mg via INTRAVENOUS

## 2017-10-18 MED ORDER — SODIUM CHLORIDE 0.9 % IV BOLUS (SEPSIS)
1000.0000 mL | Freq: Once | INTRAVENOUS | Status: AC
  Administered 2017-10-18: 1000 mL via INTRAVENOUS

## 2017-10-18 NOTE — ED Triage Notes (Signed)
Pt c/o left sided lower back pain that radiates into left flank x3 days, worsening over last 8 hours. Pt describes pain as sharpe stabbing, 8/10. Pt denies urinary symptoms, n/v.

## 2017-10-18 NOTE — ED Provider Notes (Signed)
[facility] Emergency Department Provider Note   ____________________________________________   First MD Initiated Contact with Patient 10/18/17 [phone]     (approximate)  I have reviewed the triage vital signs and the nursing notes.   HISTORY  Chief Complaint Flank Pain    HPI Benjamin [name] is a 66 y.o. male who comes into the hospital today with some left-sided flank pain. The patient thinks he is having a kidney stone. He first hurting Saturday night which was 2 days ago. He reports that he thought it was just some back pain. The pain has gotten worse over the last few nights. He said that tonight at midnight he took some hydrocodone and Tylenol but the pain did not get any better. He reports that he had a kidney stone about 15-20 years ago and it feels exactly the same. The patient has had some nausea with no vomiting. He denies any dysuria or hematuria. He reports that he's had very little urine output the patient states that he has pain on his left side.patient rates his pain10/10 in intensity.   Past Medical History:  Diagnosis Date  . Anxiety   . Arthritis   . Diabetes mellitus without complication ([place])   . Erythrocytosis 05/18/2015  . Erythrocytosis   . Polycythemia   . Sleep apnea     Patient Active Problem List   Diagnosis Date Noted  . Erythrocytosis 05/18/2015    Past Surgical History:  Procedure Laterality Date  . cataracts    . COLONOSCOPY N/A 06/02/2015   Procedure: COLONOSCOPY;  Surgeon: [name], MD;  Location: [facility] ENDOSCOPY;  Service: Endoscopy;  Laterality: N/A;    Prior to Admission medications   Medication Sig Start Date End Date Taking? Authorizing Provider  acetaminophen (TYLENOL) 500 MG tablet Take 1,000 mg by mouth every 6 (six) hours as needed for mild pain.    [provider]  canagliflozin (INVOKANA) 100 MG TABS tablet Take 100 mg by mouth daily.    [provider]  cetirizine (ZYRTEC)  10 MG tablet Take 10 mg by mouth daily as needed for allergies. Reported on 06/15/2016    [provider]  glipiZIDE (GLUCOTROL) 5 MG tablet Take 5 mg by mouth 2 (two) times daily.    [provider]  HYDROcodone-acetaminophen (NORCO/VICODIN) 5-325 MG per tablet Take 1 tablet by mouth every 6 (six) hours as needed for moderate pain. Reported on 06/15/2016    [provider]  LORazepam (ATIVAN) 1 MG tablet Take 1 tablet (1 mg total) by mouth daily. 10/20/15   Forest Gleason, MD  metFORMIN (GLUCOPHAGE-XR) 500 MG 24 hr tablet Take 1,000 mg by mouth 2 (two) times daily.    [provider]  pravastatin (PRAVACHOL) 10 MG tablet Take 10 mg by mouth daily.    [provider]  saxagliptin HCl (ONGLYZA) 5 MG TABS tablet Take 5 mg by mouth daily.    [provider]    Allergies Sertraline hcl  Family History  Problem Relation Age of Onset  . Heart disease Mother   . Diabetes Father     Social History Social History  Substance Use Topics  . Smoking status: Never Smoker  . Smokeless tobacco: Never Used  . Alcohol use No    Review of Systems  Constitutional: No fever/chills Eyes: No visual changes. ENT: No sore throat. Cardiovascular: Denies chest pain. Respiratory: Denies shortness of breath. Gastrointestinal: abdominal pain, nausea, no vomiting.  No diarrhea.  No constipation.  Genitourinary: Negative for dysuria. Musculoskeletal:  back pain. Skin: Negative for rash. Neurological: Negative for headaches, focal weakness or numbness.   ____________________________________________   PHYSICAL EXAM:  VITAL SIGNS: ED Triage Vitals  Enc Vitals Group     BP 10/18/17 0542 (!) 154/97     Pulse Rate 10/18/17 0542 90     Resp 10/18/17 0542 16     Temp 10/18/17 0542 98 F (36.7 C)     Temp Source 10/18/17 0542 Oral     SpO2 10/18/17 0542 98 %     Weight 10/18/17 0541 227 lb (103 kg)     Height --      Head Circumference --      Peak  Flow --      Pain Score --      Pain Loc --      Pain Edu? --      Excl. in [place]? --     Constitutional: Alert and oriented. Well appearing and in moderate distress. Eyes: Conjunctivae are normal. PERRL. EOMI. Head: Atraumatic. Nose: No congestion/rhinnorhea. Mouth/Throat: Mucous membranes are moist.  Oropharynx non-erythematous. Cardiovascular: Normal rate, regular rhythm. Grossly normal heart sounds.  Good peripheral circulation. Respiratory: Normal respiratory effort.  No retractions. Lungs CTAB. Gastrointestinal: Soft left lateral abdominal pain. No distention. Positive bowel sounds, Left CVA tenderness. Musculoskeletal: No lower extremity tenderness nor edema.   Neurologic:  Normal speech and language. No gross focal neurologic deficits are appreciated. No gait instability. Skin:  Skin is warm, dry and intact.  Psychiatric: Mood and affect are normal.   ____________________________________________   LABS (all labs ordered are listed, but only abnormal results are displayed)  Labs Reviewed  LIPASE, BLOOD - Abnormal; Notable for the following:       Result Value   Lipase 216 (*)    All other components within normal limits  COMPREHENSIVE METABOLIC PANEL - Abnormal; Notable for the following:    CO2 21 (*)    Glucose, Bld 199 (*)    All other components within normal limits  CBC - Abnormal; Notable for the following:    RBC 6.45 (*)    MCV 76.8 (*)    MCH 25.1 (*)    RDW 16.0 (*)    All other components within normal limits  URINALYSIS, COMPLETE (UACMP) WITH MICROSCOPIC - Abnormal; Notable for the following:    Color, Urine YELLOW (*)    APPearance CLEAR (*)    Glucose, UA >=500 (*)    All other components within normal limits   ____________________________________________  EKG  none ____________________________________________  RADIOLOGY  Ct Renal Stone Study  Result Date: 10/18/2017 CLINICAL DATA:  66 year old male with left-sided lower back pain extending  to left flank for the past 3 days worse over the past 8 hours. Initial encounter. EXAM: CT ABDOMEN AND PELVIS WITHOUT CONTRAST TECHNIQUE: Multidetector CT imaging of the abdomen and pelvis was performed following the standard protocol without IV contrast. COMPARISON:  08/29/2012 CT. FINDINGS: Lower chest: Minimal scarring lung bases. Heart size within normal limits with prominent 3 vessel coronary artery calcification. Calcified aortic valve. Slightly prominent main pulmonary artery. Hepatobiliary: Taking into account limitation by non contrast imaging, no worrisome hepatic lesion. No calcified gallstones. Pancreas: Taking into account limitation by non contrast imaging, no pancreatic mass or inflammation. Spleen: Taking into account limitation by non contrast imaging, no splenic mass or enlargement. Adrenals/Urinary Tract: Chronic left hydronephrosis with left ureteral pelvic junction obstruction configuration. Within the dilated left calices are a 1.2  cm upper pole and 8 mm lower pole stone. 6.1 cm and 1.2 cm left lower pole renal cyst. No left ureteral obstructing stone. No right renal or ureteral obstructing stone or hydronephrosis. Taking into account limitation by non contrast imaging, no worrisome right renal lesion or adrenal lesion. Noncontrast filled views of the urinary bladder without stone or mass identified. Slight impression upon the bladder base by prostate gland. Stomach/Bowel: Ligament of Treitz just to the right of midline. Majority of small bowel within the right abdomen. Cecum remains on the right. No extraluminal bowel inflammatory process, free fluid or free air. No bowel containing hernia. Small duodenal diverticulum incidentally noted. Vascular/Lymphatic: Mild plaque abdominal aorta with mild ectasia with maximal transverse dimension of 2.3 cm. Plaque iliac arteries and femoral arteries. Top-normal size external iliac lymph nodes greater on left without change. Reproductive: Heterogeneous  prominence size prostate gland. Other: Mild diastases rectus muscles. Musculoskeletal: Bony overgrowth right iliac wing unchanged. No osseous destructive lesion. 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 Within the dilated left calices are a 1.2 cm upper pole and 8 mm lower pole stone. 6.1 cm and 1.2 cm left lower pole renal cyst. No left ureteral obstructing stone. Ligament of Treitz just to the right of midline. Majority of small bowel within the right abdomen. Cecum remains on the right. No extraluminal bowel inflammatory process, free fluid or free air. Heterogeneous prominent size prostate gland. Clinical and laboratory correlation recommended. Aortic Atherosclerosis (ICD10-I70.0). Prominent 3 vessel coronary artery calcification. Electronically Signed   By: Genia Del M.D.   On: 10/18/2017 07:28    ____________________________________________   PROCEDURES  Procedure(s) performed: None  Procedures  Critical Care performed: No  ____________________________________________   INITIAL IMPRESSION / ASSESSMENT AND PLAN / ED COURSE  As part of my medical decision making, I reviewed the following data within the electronic MEDICAL RECORD NUMBER Notes from prior ED visits and [place] Controlled Substance Database   this is a 66 year old man who comes into the hospital today with some left-sided flank pain. The patient does have a history of kidney stones. My differential diagnosis includes musculoskeletal back pain, pyelonephritis and kidney stones. I will give the patient a dose of morphine and Zofran given his pain as well as liter of normal saline. I will send him for a CT scan to evaluate for kidney stones. The patient did receive a CBC and a urinalysis which were negative.     I contacted Dr. Erlene Quan the urologist to discuss the results of the CT scan. The patient's CT scan shows an obstruction configuration at the left UPJ with some  large stones in the calyces and some ureteral dilation. The patient's creatinine is unremarkable and his pain is improved at this time. Dr. Erlene Quan recommended that the patient follow-up in the office. I will discharge the patient to home with some pain medicine. He does have an elevated lipase but he has no epigastric pain at this time. We will ensure that he is able to drink and give him some oral pain medicine. 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 He will be discharged home. ____________________________________________   FINAL CLINICAL IMPRESSION(S) / ED DIAGNOSES  Final diagnoses:  Flank pain  Kidney stone  Hydronephrosis with ureteropelvic junction (UPJ) obstruction      NEW MEDICATIONS STARTED DURING THIS VISIT:  New Prescriptions   No medications on file     Note:  This document was prepared using Dragon voice recognition software  and may include unintentional dictation errors.    Loney Hering, MD 10/18/17 [phone]

## 2017-10-18 NOTE — ED Notes (Signed)
Patient transported to CT 

## 2017-10-18 NOTE — ED Notes (Signed)
ED Provider at bedside. 

## 2017-10-18 NOTE — Discharge Instructions (Signed)
Please follow up with Dr. Erlene Quan.

## 2017-10-19 ENCOUNTER — Ambulatory Visit (INDEPENDENT_AMBULATORY_CARE_PROVIDER_SITE_OTHER): Payer: Medicare HMO | Admitting: Urology

## 2017-10-19 ENCOUNTER — Encounter: Payer: Self-pay | Admitting: Urology

## 2017-10-19 VITALS — BP 150/80 | HR 98 | Ht 67.0 in | Wt 220.0 lb

## 2017-10-19 DIAGNOSIS — N200 Calculus of kidney: Secondary | ICD-10-CM | POA: Diagnosis not present

## 2017-10-19 LAB — URINALYSIS, COMPLETE
Bilirubin, UA: NEGATIVE
LEUKOCYTES UA: NEGATIVE
NITRITE UA: NEGATIVE
PROTEIN UA: NEGATIVE
RBC UA: NEGATIVE
Urobilinogen, Ur: 0.2 mg/dL (ref 0.2–1.0)
pH, UA: 5 (ref 5.0–7.5)

## 2017-10-20 NOTE — Progress Notes (Signed)
10/19/2017 7:13 AM   Benjamin [name] 10-Sep-1951 [id number]  Referring provider: Maryland Pink, MD [address] [facility] [employer], [place] [address]  Chief Complaint  Patient presents with  . Nephrolithiasis    HPI: 66 yo male presented to the ED on 10/20/2017 with a 3-day history of intermittent left flank pain.  The day of his presentation he noted significant worsening of his left flank pain with the severity rated 10/10.  There were no identifiable precipitating, aggravating or alleviating factors.  He had nausea without vomiting and denied fever, chills, gross hematuria.  He has a previous history of stone disease and his pain was identical to similar episodes.  I have previously seen him for BPH and he is status post PVP in October 2013.  He has a history of nonobstructing left hydronephrosis dating back to 2011.  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  There were 8 and 12 mm nonobstructing renal calculi present as well as a large lower pole renal cyst.  He was discharged on oral analgesics and states his pain yesterday was moderate but has been only mild today.   PMH: Past Medical History:  Diagnosis Date  . Anxiety   . Arthritis   . Diabetes mellitus without complication ([place])   . Erythrocytosis 05/18/2015  . Erythrocytosis   . Polycythemia   . Sleep apnea     Surgical History: Past Surgical History:  Procedure Laterality Date  . cataracts    . COLONOSCOPY N/A 06/02/2015   Procedure: COLONOSCOPY;  Surgeon: [name], MD;  Location: [facility] ENDOSCOPY;  Service: Endoscopy;  Laterality: N/A;    Home Medications:  Allergies as of 10/19/2017      Reactions   Sertraline Hcl       Medication List       Accurate as of 10/19/17 11:59 PM. Always use your most recent med list.          acetaminophen 500 MG tablet Commonly known as:   TYLENOL Take 1,000 mg by mouth every 6 (six) hours as needed for mild pain.   canagliflozin 100 MG Tabs tablet Commonly known as:  INVOKANA Take 100 mg by mouth daily.   etodolac 200 MG capsule Commonly known as:  LODINE Take 1 capsule (200 mg total) by mouth every 8 (eight) hours.   glipiZIDE 5 MG tablet Commonly known as:  GLUCOTROL Take 5 mg by mouth 2 (two) times daily.   HYDROcodone-acetaminophen 5-325 MG tablet Commonly known as:  NORCO/VICODIN Take 1 tablet by mouth every 6 (six) hours as needed for moderate pain. Reported on 06/15/2016   IBUPROFEN PO Take by mouth.   LORazepam 1 MG tablet Commonly known as:  ATIVAN Take 1 tablet (1 mg total) by mouth daily.   metFORMIN 500 MG 24 hr tablet Commonly known as:  GLUCOPHAGE-XR Take 1,000 mg by mouth 2 (two) times daily.   ondansetron 4 MG disintegrating tablet Commonly known as:  ZOFRAN ODT Take 1 tablet (4 mg total) by mouth every 8 (eight) hours as needed for nausea or vomiting.   pravastatin 10 MG tablet Commonly known as:  PRAVACHOL Take 10 mg by mouth daily.       Allergies:  Allergies  Allergen Reactions  . Sertraline Hcl     Family History: Family History  Problem Relation Age of Onset  . Heart disease Mother   .  Diabetes Father     Social History:  reports that he has never smoked. He has never used smokeless tobacco. He reports that he does not drink alcohol or use drugs.  ROS: UROLOGY Frequent Urination?: Yes Hard to postpone urination?: Yes Burning/pain with urination?: No Get up at night to urinate?: Yes Leakage of urine?: Yes Urine stream starts and stops?: Yes Trouble starting stream?: Yes Do you have to strain to urinate?: Yes Blood in urine?: No Urinary tract infection?: No Sexually transmitted disease?: No Injury to kidneys or bladder?: No Painful intercourse?: No Weak stream?: Yes Erection problems?: Yes Penile pain?: No  Gastrointestinal Nausea?: No Vomiting?:  No Indigestion/heartburn?: No Diarrhea?: No Constipation?: No  Constitutional Fever: No Night sweats?: No Weight loss?: No Fatigue?: No  Skin Skin rash/lesions?: No Itching?: No  Eyes Blurred vision?: No Double vision?: No  Ears/Nose/Throat Sore throat?: No Sinus problems?: No  Hematologic/Lymphatic Swollen glands?: No Easy bruising?: No  Cardiovascular Leg swelling?: No Chest pain?: No  Respiratory Cough?: No Shortness of breath?: No  Endocrine Excessive thirst?: No  Musculoskeletal Back pain?: Yes Joint pain?: Yes  Neurological Headaches?: Yes Dizziness?: Yes  Psychologic Depression?: No Anxiety?: Yes  Physical Exam: BP (!) 150/80 (BP Location: Right Arm, Patient Position: Sitting, Cuff Size: Large)   Pulse 98   Ht 5\' 7"  (1.702 m)   Wt 220 lb (99.8 kg)   BMI 34.46 kg/m   Constitutional:  Alert and oriented, No acute distress. HEENT: [place] AT, moist mucus membranes.  Trachea midline, no masses. Cardiovascular: No clubbing, cyanosis, or edema. RRR without murmur Respiratory: Normal respiratory effort, no increased work of breathing.  Lungs clear GI: Abdomen is soft, nontender, nondistended, no abdominal masses GU: No CVA tenderness.  Skin: No rashes, bruises or suspicious lesions. Lymph: No cervical or inguinal adenopathy. Neurologic: Grossly intact, no focal deficits, moving all 4 extremities. Psychiatric: Normal mood and affect.  Laboratory Data: Lab Results  Component Value Date   WBC 8.6 10/18/2017   HGB 16.2 10/18/2017   HCT 49.5 10/18/2017   MCV 76.8 (L) 10/18/2017   PLT 285 10/18/2017    Lab Results  Component Value Date   CREATININE 1.06 10/18/2017    Urinalysis Lab Results  Component Value Date   SPECGRAV <1.030 10/19/2017   PHUR 5.0 10/19/2017   COLORU Yellow 10/19/2017   APPEARANCEUR Clear 10/19/2017   LEUKOCYTESUR Negative 10/19/2017   PROTEINUR Negative 10/19/2017   GLUCOSEU 3+ (A) 10/19/2017   KETONESU Trace (A)  10/19/2017   RBCU Negative 10/19/2017   BILIRUBINUR Negative 10/19/2017   UUROB 0.2 10/19/2017   NITRITE Negative 10/19/2017    Lab Results  Component Value Date   BACTERIA NONE SEEN 10/18/2017    Pertinent Imaging: CT was personally reviewed and no obstructing ureteral calculi are identified.         Results for orders placed during the hospital encounter of 10/18/17  CT Renal Stone Study   Narrative CLINICAL DATA:  66 year old male with left-sided lower back pain extending to left flank for the past 3 days worse over the past 8 hours. Initial encounter.  EXAM: CT ABDOMEN AND PELVIS WITHOUT CONTRAST  TECHNIQUE: Multidetector CT imaging of the abdomen and pelvis was performed following the standard protocol without IV contrast.  COMPARISON:  08/29/2012 CT.  FINDINGS: Lower chest: Minimal scarring lung bases. Heart size within normal limits with prominent 3 vessel coronary artery calcification. Calcified aortic valve. Slightly prominent main pulmonary artery.  Hepatobiliary: Taking into account limitation by non  contrast imaging, no worrisome hepatic lesion. No calcified gallstones.  Pancreas: Taking into account limitation by non contrast imaging, no pancreatic mass or inflammation.  Spleen: Taking into account limitation by non contrast imaging, no splenic mass or enlargement.  Adrenals/Urinary Tract: Chronic left hydronephrosis with left ureteral pelvic junction obstruction configuration. Within the dilated left calices are a 1.2 cm upper pole and 8 mm lower pole stone. 6.1 cm and 1.2 cm left lower pole renal cyst. No left ureteral obstructing stone.  No right renal or ureteral obstructing stone or hydronephrosis. Taking into account limitation by non contrast imaging, no worrisome right renal lesion or adrenal lesion.  Noncontrast filled views of the urinary bladder without stone or mass identified. Slight impression upon the bladder base by  prostate gland.  Stomach/Bowel: Ligament of Treitz just to the right of midline. Majority of small bowel within the right abdomen. Cecum remains on the right. No extraluminal bowel inflammatory process, free fluid or free air. No bowel containing hernia. Small duodenal diverticulum incidentally noted.  Vascular/Lymphatic: Mild plaque abdominal aorta with mild ectasia with maximal transverse dimension of 2.3 cm. Plaque iliac arteries and femoral arteries.  Top-normal size external iliac lymph nodes greater on left without change.  Reproductive: Heterogeneous prominence size prostate gland.  Other: Mild diastases rectus muscles.  Musculoskeletal: Bony overgrowth right iliac wing unchanged. No osseous destructive lesion.  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 Within the dilated left calices are a 1.2 cm upper pole and 8 mm lower pole stone. 6.1 cm and 1.2 cm left lower pole renal cyst. No left ureteral obstructing stone.  Ligament of Treitz just to the right of midline. Majority of small bowel within the right abdomen. Cecum remains on the right. No extraluminal bowel inflammatory process, free fluid or free air.  Heterogeneous prominent size prostate gland. Clinical and laboratory correlation recommended.  Aortic Atherosclerosis (ICD10-I70.0).  Prominent 3 vessel coronary artery calcification.   Electronically Signed   By: Genia Del M.D.   On: 10/18/2017 07:28     Assessment & Plan:    1.  Left renal colic  He appears to have a UPJ obstruction with nonobstructing calculi.  I recommended further evaluation/management with cystoscopy and left retrograde pyelogram with possible ureteral stent placement.  The procedure was discussed in detail including potential risks of bleeding, infection/sepsis, injury to ureter and potential inability to place a stent which could require percutaneous nephrostomy  present.  The potential risks of anesthesia were also discussed.  He indicated all questions were answered to his satisfaction and desires to proceed.  - Urinalysis, Complete - CULTURE, URINE COMPREHENSIVE  2.  Nephrolithiasis    [name], MD  [facility] [address] [place], [place] [address] [phone]

## 2017-10-21 ENCOUNTER — Telehealth: Payer: Self-pay

## 2017-10-21 ENCOUNTER — Encounter
Admission: RE | Admit: 2017-10-21 | Discharge: 2017-10-21 | Disposition: A | Discharge status: Home / Self Care | Payer: Medicare HMO | Source: Ambulatory Visit | Attending: Urology | Admitting: Urology

## 2017-10-21 DIAGNOSIS — R000 Tachycardia, unspecified: Secondary | ICD-10-CM | POA: Insufficient documentation

## 2017-10-21 DIAGNOSIS — Z01812 Encounter for preprocedural laboratory examination: Secondary | ICD-10-CM | POA: Insufficient documentation

## 2017-10-21 DIAGNOSIS — Z01810 Encounter for preprocedural cardiovascular examination: Secondary | ICD-10-CM | POA: Insufficient documentation

## 2017-10-21 DIAGNOSIS — R9431 Abnormal electrocardiogram [ECG] [EKG]: Secondary | ICD-10-CM | POA: Insufficient documentation

## 2017-10-21 DIAGNOSIS — E119 Type 2 diabetes mellitus without complications: Secondary | ICD-10-CM | POA: Insufficient documentation

## 2017-10-21 HISTORY — DX: Personal history of urinary calculi: Z87.442

## 2017-10-21 NOTE — Telephone Encounter (Signed)
Patient notified of surgery and pre admit date.

## 2017-10-21 NOTE — Patient Instructions (Signed)
  Your procedure is scheduled on: 10-25-17 TUESDAY Report to Same Day Surgery 2nd floor medical mall [facility] Entrance-take elevator on left to 2nd floor.  Check in with surgery information desk.) To find out your arrival time please call [phone] between 1PM - 3PM on 10-24-17 MONDAY  Remember: Instructions that are not followed completely may result in serious medical risk, up to and including death, or upon the discretion of your surgeon and anesthesiologist your surgery may need to be rescheduled.    _x___ 1. Do not eat food after midnight the night before your procedure. NO GUM CHEWING OR HARD CANDIES.  You may drink clear liquids up to 2 hours before you are scheduled to arrive at the hospital for your procedure.  Do not drink clear liquids within 2 hours of your scheduled arrival to the hospital.  Type 1 and type 2 diabetics should only drink water.     __x__ 2. No Alcohol for 24 hours before or after surgery.   __x__3. No Smoking for 24 prior to surgery.   ____  4. Bring all medications with you on the day of surgery if instructed.    __x__ 5. Notify your doctor if there is any change in your medical condition     (cold, fever, infections).     Do not wear jewelry, make-up, hairpins, clips or nail polish.  Do not wear lotions, powders, or perfumes. You may wear deodorant.  Do not shave 48 hours prior to surgery. Men may shave face and neck.  Do not bring valuables to the hospital.    [facility] is not responsible for any belongings or valuables.               Contacts, dentures or bridgework may not be worn into surgery.  Leave your suitcase in the car. After surgery it may be brought to your room.  For patients admitted to the hospital, discharge time is determined by your treatment team.   Patients discharged the day of surgery will not be allowed to drive home.  You will need someone to drive you home and stay with you the night of your procedure.    Please read  over the following fact sheets that you were given:     _x___ [place] WITH A SMALL SIP OF WATER. These include:  1. ATIVAN  2.  3.  4.  5.  6.  ____Fleets enema or Magnesium Citrate as directed.   ____ Use CHG Soap or sage wipes as directed on instruction sheet   ____ Use inhalers on the day of surgery and bring to hospital day of surgery  _X___ Stop Metformin 2 days prior to surgery-LAST DOSE OF METFORMIN ON Saturday, October 27TH    ____ Take 1/2 of usual insulin dose the night before surgery and none on the morning surgery.   ____ Follow recommendations from Cardiologist, Pulmonologist or PCP regarding stopping Aspirin, Coumadin, Plavix ,Eliquis, Effient, or Pradaxa, and Pletal.  X____Stop Anti-inflammatories such as Advil, Aleve, IBUPROFEN, Motrin, Naproxen,LODINE Naprosyn, Goodies powders or aspirin products. OK to take Tylenol OR HYDROCODONE IF NEEDED   ____ Stop supplements until after surgery.     _X___ Bring C-Pap to the hospital.

## 2017-10-21 NOTE — Pre-Procedure Instructions (Signed)
ECG 12-lead2/26/2018 [place] Component Name Value Ref Range  Vent Rate (bpm) 98   PR Interval (msec) 144   QRS Interval (msec) 78   QT Interval (msec) 356   QTc (msec) 454   Result Narrative  Sinus rhythm with occasional premature ventricular complexes Nonspecific ST and T wave abnormality Abnormal ECG No previous ECGs available I reviewed and concur with this report. Electronically signed DY:[email or web address], MD, Jeneen Rinks [phone]) on 07/11/2017 7:27:46 AM  Status Results Details

## 2017-10-21 NOTE — Telephone Encounter (Signed)
Message left for the patient to call back to get his surgery date and pre admit phone date and any further questions about his upcoming surgery. The patient is scheduled for surgery with Dr Erlene Quan at [facility] on 10/25/17. He will pre admit by phone on 10/21/17 in the afternoon.

## 2017-10-23 LAB — CULTURE, URINE COMPREHENSIVE

## 2017-10-24 ENCOUNTER — Telehealth: Payer: Self-pay | Admitting: Urology

## 2017-10-24 ENCOUNTER — Encounter
Admission: RE | Admit: 2017-10-24 | Discharge: 2017-10-24 | Disposition: A | Discharge status: Home / Self Care | Payer: Medicare HMO | Source: Ambulatory Visit | Attending: Urology | Admitting: Urology

## 2017-10-24 ENCOUNTER — Other Ambulatory Visit: Payer: Self-pay

## 2017-10-24 DIAGNOSIS — Z01812 Encounter for preprocedural laboratory examination: Secondary | ICD-10-CM | POA: Diagnosis not present

## 2017-10-24 DIAGNOSIS — E119 Type 2 diabetes mellitus without complications: Secondary | ICD-10-CM | POA: Diagnosis not present

## 2017-10-24 DIAGNOSIS — R000 Tachycardia, unspecified: Secondary | ICD-10-CM | POA: Diagnosis not present

## 2017-10-24 DIAGNOSIS — R9431 Abnormal electrocardiogram [ECG] [EKG]: Secondary | ICD-10-CM | POA: Diagnosis not present

## 2017-10-24 DIAGNOSIS — Z01810 Encounter for preprocedural cardiovascular examination: Secondary | ICD-10-CM | POA: Diagnosis present

## 2017-10-24 LAB — LIPASE, BLOOD: LIPASE: 75 U/L — AB (ref 11–51)

## 2017-10-24 MED ORDER — HYDROCODONE-ACETAMINOPHEN 5-325 MG PO TABS: 1.0000 | ORAL_TABLET | Freq: Two times a day (BID) | ORAL | 0 refills | Status: AC | PRN

## 2017-10-24 NOTE — Telephone Encounter (Signed)
Please call Benjamin [name] and let him know his prescription is available upfront.  Please advise him that it is a narcotic pain medication and it is a controlled substance.  There is a potential for addiction with this medication and taking the medication does increase the risk of respiratory suppression. He should not drive, operate heavy machinery or make important life decisions while he is on this medication.  His overdose risk score is 160 meaning he has a 1-100 chance of having an accidental overdose with this medication.

## 2017-10-24 NOTE — Telephone Encounter (Signed)
Patient was notified that unfortunately due to Dr. Erlene Quan being sick that for his safety we would need to reschedule surgery from 10-25-17 until 11-02-17. Patient states he is having a lot of pain still and would like it sooner if possible. Patient was told Dr. Bernardo Heater could do surgery on 11-01-17 and he was agreeable with this. He was told he could not take his Lodine prior to surgery for pain because of ASA. He is now having to take Hydrocodone to help his pain and is almost out and would like a refill to last him since we are having to reschedule his surgery. Patient was notified that a request would be sent and would get back with him later today. Again apologized to the patient for the inconvenience.

## 2017-10-25 NOTE — Telephone Encounter (Signed)
Patient previously notified to pick up script form the front

## 2017-10-31 MED ORDER — CEFAZOLIN SODIUM-DEXTROSE 2-4 GM/100ML-% IV SOLN
2.0000 g | Freq: Once | INTRAVENOUS | Status: AC
  Administered 2017-11-01: 2 g via INTRAVENOUS

## 2017-11-01 ENCOUNTER — Ambulatory Visit: Payer: Medicare HMO | Admitting: Registered Nurse

## 2017-11-01 ENCOUNTER — Ambulatory Visit
Admission: RE | Admit: 2017-11-01 | Discharge: 2017-11-01 | Disposition: A | Discharge status: Home / Self Care | Payer: Medicare HMO | Source: Ambulatory Visit | Attending: Urology | Admitting: Urology

## 2017-11-01 ENCOUNTER — Encounter
Admission: RE | Disposition: A | Discharge status: Home / Self Care | Payer: Self-pay | Source: Ambulatory Visit | Attending: Urology

## 2017-11-01 ENCOUNTER — Encounter: Payer: Self-pay | Admitting: *Deleted

## 2017-11-01 DIAGNOSIS — E119 Type 2 diabetes mellitus without complications: Secondary | ICD-10-CM | POA: Insufficient documentation

## 2017-11-01 DIAGNOSIS — G4730 Sleep apnea, unspecified: Secondary | ICD-10-CM | POA: Insufficient documentation

## 2017-11-01 DIAGNOSIS — Z79899 Other long term (current) drug therapy: Secondary | ICD-10-CM | POA: Insufficient documentation

## 2017-11-01 DIAGNOSIS — N131 Hydronephrosis with ureteral stricture, not elsewhere classified: Secondary | ICD-10-CM | POA: Diagnosis present

## 2017-11-01 DIAGNOSIS — N1330 Unspecified hydronephrosis: Secondary | ICD-10-CM | POA: Diagnosis not present

## 2017-11-01 DIAGNOSIS — F419 Anxiety disorder, unspecified: Secondary | ICD-10-CM | POA: Insufficient documentation

## 2017-11-01 HISTORY — PX: CYSTOSCOPY W/ RETROGRADES: SHX1426

## 2017-11-01 HISTORY — PX: CYSTOSCOPY WITH STENT PLACEMENT: SHX5790

## 2017-11-01 LAB — GLUCOSE, CAPILLARY
GLUCOSE-CAPILLARY: 177 mg/dL — AB (ref 65–99)
Glucose-Capillary: 144 mg/dL — ABNORMAL HIGH (ref 65–99)

## 2017-11-01 SURGERY — CYSTOSCOPY, WITH STENT INSERTION
Anesthesia: General | Site: Ureter | Laterality: Left | Wound class: Clean Contaminated

## 2017-11-01 MED ORDER — FENTANYL CITRATE (PF) 100 MCG/2ML IJ SOLN
INTRAMUSCULAR | Status: AC
  Filled 2017-11-01: qty 2

## 2017-11-01 MED ORDER — IPRATROPIUM-ALBUTEROL 0.5-2.5 (3) MG/3ML IN SOLN
3.0000 mL | RESPIRATORY_TRACT | Status: DC
  Administered 2017-11-01: 3 mL via RESPIRATORY_TRACT

## 2017-11-01 MED ORDER — ONDANSETRON HCL 4 MG/2ML IJ SOLN: 4.0000 mg | Freq: Once | INTRAMUSCULAR | Status: DC | PRN

## 2017-11-01 MED ORDER — PROPOFOL 10 MG/ML IV BOLUS
INTRAVENOUS | Status: AC
  Filled 2017-11-01: qty 20

## 2017-11-01 MED ORDER — LIDOCAINE HCL (PF) 2 % IJ SOLN
INTRAMUSCULAR | Status: AC
  Filled 2017-11-01: qty 10

## 2017-11-01 MED ORDER — PROPOFOL 10 MG/ML IV BOLUS
INTRAVENOUS | Status: DC | PRN
  Administered 2017-11-01: 50 mg via INTRAVENOUS
  Administered 2017-11-01: 150 mg via INTRAVENOUS

## 2017-11-01 MED ORDER — DEXAMETHASONE SODIUM PHOSPHATE 10 MG/ML IJ SOLN
INTRAMUSCULAR | Status: DC | PRN
  Administered 2017-11-01: 10 mg via INTRAVENOUS

## 2017-11-01 MED ORDER — OXYBUTYNIN CHLORIDE 5 MG PO TABS
5.0000 mg | ORAL_TABLET | Freq: Three times a day (TID) | ORAL | Status: DC | PRN
  Administered 2017-11-01: 5 mg via ORAL
  Filled 2017-11-01: qty 1

## 2017-11-01 MED ORDER — FENTANYL CITRATE (PF) 100 MCG/2ML IJ SOLN
INTRAMUSCULAR | Status: DC | PRN
  Administered 2017-11-01: 25 ug via INTRAVENOUS
  Administered 2017-11-01: 100 ug via INTRAVENOUS
  Administered 2017-11-01: 25 ug via INTRAVENOUS
  Administered 2017-11-01: 50 ug via INTRAVENOUS

## 2017-11-01 MED ORDER — OXYBUTYNIN CHLORIDE 5 MG PO TABS
ORAL_TABLET | ORAL | Status: AC
  Administered 2017-11-01: 5 mg via ORAL
  Filled 2017-11-01: qty 1

## 2017-11-01 MED ORDER — IPRATROPIUM-ALBUTEROL 0.5-2.5 (3) MG/3ML IN SOLN
RESPIRATORY_TRACT | Status: AC
  Administered 2017-11-01: 3 mL via RESPIRATORY_TRACT
  Filled 2017-11-01: qty 3

## 2017-11-01 MED ORDER — OXYBUTYNIN CHLORIDE 5 MG PO TABS: 5.0000 mg | ORAL_TABLET | Freq: Three times a day (TID) | ORAL | 1 refills | Status: DC | PRN

## 2017-11-01 MED ORDER — MIDAZOLAM HCL 2 MG/2ML IJ SOLN
INTRAMUSCULAR | Status: AC
  Filled 2017-11-01: qty 2

## 2017-11-01 MED ORDER — ONDANSETRON HCL 4 MG/2ML IJ SOLN
INTRAMUSCULAR | Status: AC
  Filled 2017-11-01: qty 2

## 2017-11-01 MED ORDER — DEXAMETHASONE SODIUM PHOSPHATE 10 MG/ML IJ SOLN
INTRAMUSCULAR | Status: AC
  Filled 2017-11-01: qty 1

## 2017-11-01 MED ORDER — IOTHALAMATE MEGLUMINE 43 % IV SOLN
INTRAVENOUS | Status: DC | PRN
  Administered 2017-11-01: 15 mL via URETHRAL

## 2017-11-01 MED ORDER — MIDAZOLAM HCL 2 MG/2ML IJ SOLN
INTRAMUSCULAR | Status: DC | PRN
  Administered 2017-11-01: 2 mg via INTRAVENOUS

## 2017-11-01 MED ORDER — FENTANYL CITRATE (PF) 100 MCG/2ML IJ SOLN
INTRAMUSCULAR | Status: AC
  Administered 2017-11-01: 25 ug via INTRAVENOUS
  Filled 2017-11-01: qty 2

## 2017-11-01 MED ORDER — GLYCOPYRROLATE 0.2 MG/ML IJ SOLN
INTRAMUSCULAR | Status: AC
  Filled 2017-11-01: qty 1

## 2017-11-01 MED ORDER — FAMOTIDINE 20 MG PO TABS
20.0000 mg | ORAL_TABLET | Freq: Once | ORAL | Status: AC
  Administered 2017-11-01: 20 mg via ORAL

## 2017-11-01 MED ORDER — SODIUM CHLORIDE 0.9 % IV SOLN
INTRAVENOUS | Status: DC
  Administered 2017-11-01: 10:00:00 via INTRAVENOUS

## 2017-11-01 MED ORDER — LIDOCAINE HCL (CARDIAC) 20 MG/ML IV SOLN
INTRAVENOUS | Status: DC | PRN
  Administered 2017-11-01: 100 mg via INTRAVENOUS

## 2017-11-01 MED ORDER — PHENYLEPHRINE HCL 10 MG/ML IJ SOLN
INTRAMUSCULAR | Status: DC | PRN
  Administered 2017-11-01 (×2): 100 ug via INTRAVENOUS

## 2017-11-01 MED ORDER — SUCCINYLCHOLINE CHLORIDE 20 MG/ML IJ SOLN
INTRAMUSCULAR | Status: DC | PRN
Start: 2017-11-01 — End: 2017-11-01
  Administered 2017-11-01: 100 mg via INTRAVENOUS

## 2017-11-01 MED ORDER — PHENAZOPYRIDINE HCL 200 MG PO TABS: 200.0000 mg | ORAL_TABLET | Freq: Three times a day (TID) | ORAL | 0 refills | Status: DC | PRN

## 2017-11-01 MED ORDER — FENTANYL CITRATE (PF) 100 MCG/2ML IJ SOLN
25.0000 ug | INTRAMUSCULAR | Status: DC | PRN
  Administered 2017-11-01 (×3): 25 ug via INTRAVENOUS

## 2017-11-01 SURGICAL SUPPLY — 24 items
BAG DRAIN CYSTO-URO LG1000N (MISCELLANEOUS) ×3 IMPLANT
CATH URETL 5X70 OPEN END (CATHETERS) ×3 IMPLANT
CONRAY 43 FOR UROLOGY 50M (MISCELLANEOUS) ×3 IMPLANT
DRAPE UTILITY 15X26 TOWEL STRL (DRAPES) ×3 IMPLANT
GLOVE BIO SURGEON STRL SZ 6.5 (GLOVE) ×2 IMPLANT
GLOVE BIO SURGEONS STRL SZ 6.5 (GLOVE) ×1
GLOVE BIOGEL PI IND STRL 8 (GLOVE) ×1 IMPLANT
GLOVE BIOGEL PI INDICATOR 8 (GLOVE) ×2
GOWN STANDARD XL  REUSABL (MISCELLANEOUS) ×3 IMPLANT
GOWN STRL REUS W/ TWL LRG LVL3 (GOWN DISPOSABLE) ×2 IMPLANT
GOWN STRL REUS W/TWL LRG LVL3 (GOWN DISPOSABLE) ×4
KIT RM TURNOVER CYSTO AR (KITS) ×3 IMPLANT
PACK CYSTO AR (MISCELLANEOUS) ×3 IMPLANT
SCRUB POVIDONE IODINE 4 OZ (MISCELLANEOUS) ×3 IMPLANT
SENSORWIRE 0.038 NOT ANGLED (WIRE) ×3
SET CYSTO W/LG BORE CLAMP LF (SET/KITS/TRAYS/PACK) ×3 IMPLANT
SOL .9 NS 3000ML IRR  AL (IV SOLUTION) ×2
SOL .9 NS 3000ML IRR UROMATIC (IV SOLUTION) ×1 IMPLANT
STENT URET 6FRX24 CONTOUR (STENTS) ×3 IMPLANT
STENT URET 6FRX26 CONTOUR (STENTS) IMPLANT
SURGILUBE 2OZ TUBE FLIPTOP (MISCELLANEOUS) ×3 IMPLANT
SYRINGE IRR TOOMEY STRL 70CC (SYRINGE) ×3 IMPLANT
WATER STERILE IRR 1000ML POUR (IV SOLUTION) ×3 IMPLANT
WIRE SENSOR 0.038 NOT ANGLED (WIRE) ×1 IMPLANT

## 2017-11-01 NOTE — H&P (Signed)
History and Physical Exam reviewed; patient is OK for planned anesthetic and procedure.  Site marked.

## 2017-11-01 NOTE — Transfer of Care (Signed)
Immediate Anesthesia Transfer of Care Note  Patient: Benjamin [name]  Procedure(s) Performed: Procedure(s): CYSTOSCOPY WITH STENT PLACEMENT (Left) CYSTOSCOPY WITH RETROGRADE PYELOGRAM (Left)  Patient Location: PACU  Anesthesia Type:General  Level of Consciousness: sedated  Airway & Oxygen Therapy: Patient Spontanous Breathing and Patient connected to face mask oxygen  Post-op Assessment: Report given to RN and Post -op Vital signs reviewed and stable  Post vital signs: Reviewed and stable  Last Vitals:  Vitals:   11/01/17 0929 11/01/17 1122  BP: 126/77 129/86  Pulse: 91 (!) 101  Resp: 16 18  Temp: 36.7 C 36.7 C  SpO2: 46% 95%    Complications: No apparent anesthesia complications

## 2017-11-01 NOTE — Discharge Instructions (Signed)
DISCHARGE INSTRUCTIONS FOR URETERAL STENT   MEDICATIONS:  1.  Resume all your other meds from home.  2. Pyridium is to help with the burning/stinging when you urinate.  3. Oxybutinin is for frequency, urgency, bladder spasms 4. Rx's were sent to your pharmacy   ACTIVITY:  1. No strenuous activity x 1week  2. No driving while on narcotic pain medications  3. Drink plenty of water  4. Continue to walk at home - you can still get blood clots when you are at home, so keep active, but don't over do it.  5. May return to work/school tomorrow or when you feel ready   BATHING:  1. You can shower.  SIGNS/SYMPTOMS TO CALL:  Please call us if you have a fever greater than 101.5, uncontrolled nausea/vomiting, uncontrolled pain, dizziness, unable to urinate, bloody urine, chest pain, shortness of breath, leg swelling, leg pain, redness around wound, drainage from wound, or any other concerns or questions.   You can reach [place] at [phone].   FOLLOW-UP:  1. You have an appointment in 2 weeks

## 2017-11-01 NOTE — Anesthesia Procedure Notes (Signed)
Procedure Name: Intubation Date/Time: 11/01/2017 10:46 AM Performed by: Doreen Salvage, CRNA Pre-anesthesia Checklist: Patient identified, Patient being monitored, Timeout performed, Emergency Drugs available and Suction available Patient Re-evaluated:Patient Re-evaluated prior to induction Oxygen Delivery Method: Circle system utilized Preoxygenation: Pre-oxygenation with 100% oxygen Induction Type: IV induction Ventilation: Mask ventilation without difficulty Laryngoscope Size: Mac and 3 Grade View: Grade I Tube type: Oral Tube size: 7.5 mm Number of attempts: 1 Airway Equipment and Method: Stylet Placement Confirmation: ETT inserted through vocal cords under direct vision,  positive ETCO2 and breath sounds checked- equal and bilateral Secured at: 23 cm Tube secured with: Tape Dental Injury: Teeth and Oropharynx as per pre-operative assessment

## 2017-11-01 NOTE — Anesthesia Postprocedure Evaluation (Signed)
Anesthesia Post Note  Patient: Benjamin [name]  Procedure(s) Performed: CYSTOSCOPY WITH STENT PLACEMENT (Left Ureter) CYSTOSCOPY WITH RETROGRADE PYELOGRAM (Left Ureter)  Patient location during evaluation: PACU Anesthesia Type: General Level of consciousness: awake and alert and oriented Pain management: pain level controlled Vital Signs Assessment: post-procedure vital signs reviewed and stable Respiratory status: spontaneous breathing Cardiovascular status: blood pressure returned to baseline Anesthetic complications: no     Last Vitals:  Vitals:   11/01/17 1227 11/01/17 1318  BP: 127/72 126/84  Pulse: 94 88  Resp: 16   Temp:    SpO2: 97% 96%    Last Pain:  Vitals:   11/01/17 1318  TempSrc:   PainSc: 1                  Inita Uram

## 2017-11-01 NOTE — Anesthesia Preprocedure Evaluation (Signed)
Anesthesia Evaluation  Patient identified by MRN, date of birth, ID band Patient awake    Reviewed: Allergy & Precautions, NPO status , Patient's Chart, lab work & pertinent test results, reviewed documented beta blocker date and time   Airway Mallampati: III  TM Distance: >3 FB     Dental  (+) Chipped, Missing   Pulmonary sleep apnea and Continuous Positive Airway Pressure Ventilation ,    Pulmonary exam normal        Cardiovascular Normal cardiovascular exam     Neuro/Psych Anxiety negative neurological ROS     GI/Hepatic Neg liver ROS,   Endo/Other  diabetes, Well Controlled, Type 2  Renal/GU  Bladder dysfunction      Musculoskeletal  (+) Arthritis , Osteoarthritis,    Abdominal Normal abdominal exam  (+)   Peds negative pediatric ROS (+)  Hematology polycythemia   Anesthesia Other Findings Past Medical History: No date: Anxiety No date: Arthritis No date: Diabetes mellitus without complication ([place]) 04/11/3844: Erythrocytosis No date: Erythrocytosis No date: History of kidney stones No date: Polycythemia No date: Sleep apnea     Comment:  USES CPAP  Reproductive/Obstetrics                             Anesthesia Physical  Anesthesia Plan  ASA: III  Anesthesia Plan: General   Post-op Pain Management:    Induction: Intravenous  PONV Risk Score and Plan: 1  Airway Management Planned: Oral ETT  Additional Equipment:   Intra-op Plan:   Post-operative Plan: Extubation in OR  Informed Consent: I have reviewed the patients History and Physical, chart, labs and discussed the procedure including the risks, benefits and alternatives for the proposed anesthesia with the patient or authorized representative who has indicated his/her understanding and acceptance.   Dental advisory given  Plan Discussed with: CRNA and Surgeon  Anesthesia Plan Comments:          Anesthesia Quick Evaluation

## 2017-11-01 NOTE — Anesthesia Post-op Follow-up Note (Signed)
Anesthesia QCDR form completed.        

## 2017-11-01 NOTE — Progress Notes (Signed)
Duo neb given for low sat of 88 to 90

## 2017-11-01 NOTE — Op Note (Signed)
Preoperative diagnosis:  1. Left hydronephrosis secondary to UPJ obstruction  Postoperative diagnosis:        1.   Left hydronephrosis secondary to UPJ obstruction  Procedure:  1. Cystoscopy 2. Left ureteral stent placement  3. Left retrograde pyelography with interpretation   Surgeon: Nicki Reaper C. Aaronjames Kelsay, M.D.  Anesthesia: General  Complications: None  Intraoperative findings:  Cystoscopic: Urethra normal in caliber without stricture.  TUR changes with lateral lobe regrowth.  Bladder mucosa without erythema, solid or papillary lesions.  Ureteral orifices normal appearing bilaterally with clear efflux.  Left retrograde pyelogram: Left ureter normal in caliber without dilation or filling defect.  Significant narrowing at the UPJ.  Dilated left renal pelvis with moderate hydronephrosis.  Calyceal calculi present.  EBL: Minimal  Specimens: None  Indication: Benjamin [name] is a 66 y.o. patient with recent onset of left renal colic.  CT remarkable for left hydronephrosis and nonobstructing calculi and findings consistent with a left UPJ obstruction.  He has a history of an asymptomatic mild left UPJ obstruction which appears worse when reviewing prior imaging. After reviewing the management options for treatment, he elected to proceed with the above surgical procedure(s). We have discussed the potential benefits and risks of the procedure, side effects of the proposed treatment, the likelihood of the patient achieving the goals of the procedure, and any potential problems that might occur during the procedure or recuperation. Informed consent has been obtained.  Description of procedure:  The patient was taken to the operating room and general anesthesia was induced.  The patient was placed in the dorsal lithotomy position, prepped and draped in the usual sterile fashion, and preoperative antibiotics were administered. A preoperative time-out was performed.   Cystourethroscopy was  performed with findings as described above.    Attention then turned to the left ureteral orifice and a ureteral catheter was used to intubate the ureteral orifice.  Omnipaque contrast was injected through the ureteral catheter and a retrograde pyelogram was performed with findings as dictated above.  A 0.038 sensor guidewire was then advanced up the left ureter into the renal pelvis under fluoroscopic guidance.  The wire was then backloaded through the cystoscope and a ureteral stent was advance over the wire using Seldinger technique.  The stent was positioned appropriately under fluoroscopic and cystoscopic guidance.  The wire was then removed with an adequate stent curl noted in the renal pelvis as well as in the bladder.  The bladder was then emptied and the procedure ended.  The patient appeared to tolerate the procedure well and without complications.  The patient was able to be awakened and transferred to the recovery unit in satisfactory condition.

## 2017-11-09 ENCOUNTER — Inpatient Hospital Stay: Payer: Medicare HMO | Attending: Hematology and Oncology | Admitting: *Deleted

## 2017-11-09 ENCOUNTER — Inpatient Hospital Stay: Payer: Medicare HMO

## 2017-11-09 VITALS — BP 137/78 | HR 99 | Resp 18

## 2017-11-09 DIAGNOSIS — D751 Secondary polycythemia: Secondary | ICD-10-CM | POA: Diagnosis present

## 2017-11-09 LAB — CBC WITH DIFFERENTIAL/PLATELET
Basophils Absolute: 0.1 10*3/uL (ref 0–0.1)
Basophils Relative: 1 %
Eosinophils Absolute: 0.6 10*3/uL (ref 0–0.7)
Eosinophils Relative: 5 %
HCT: 49.3 % (ref 40.0–52.0)
Hemoglobin: 16.4 g/dL (ref 13.0–18.0)
Lymphocytes Relative: 24 %
Lymphs Abs: 2.8 10*3/uL (ref 1.0–3.6)
MCH: 25.6 pg — ABNORMAL LOW (ref 26.0–34.0)
MCHC: 33.3 g/dL (ref 32.0–36.0)
MCV: 76.8 fL — ABNORMAL LOW (ref 80.0–100.0)
Monocytes Absolute: 1 10*3/uL (ref 0.2–1.0)
Monocytes Relative: 8 %
Neutro Abs: 7.5 10*3/uL — ABNORMAL HIGH (ref 1.4–6.5)
Neutrophils Relative %: 62 %
Platelets: 281 10*3/uL (ref 150–440)
RBC: 6.42 MIL/uL — ABNORMAL HIGH (ref 4.40–5.90)
RDW: 16.1 % — ABNORMAL HIGH (ref 11.5–14.5)
WBC: 12 10*3/uL — ABNORMAL HIGH (ref 3.8–10.6)

## 2017-11-09 LAB — FERRITIN: Ferritin: 23 ng/mL — ABNORMAL LOW (ref 24–336)

## 2017-11-11 ENCOUNTER — Other Ambulatory Visit: Payer: Medicare HMO

## 2017-11-15 ENCOUNTER — Encounter: Payer: Self-pay | Admitting: Urology

## 2017-11-15 ENCOUNTER — Ambulatory Visit: Payer: Medicare HMO | Admitting: Urology

## 2017-11-15 VITALS — BP 134/84 | HR 108 | Ht 68.0 in | Wt 221.2 lb

## 2017-11-15 DIAGNOSIS — N200 Calculus of kidney: Secondary | ICD-10-CM

## 2017-11-15 LAB — URINALYSIS, COMPLETE
BILIRUBIN UA: NEGATIVE
KETONES UA: NEGATIVE
Nitrite, UA: NEGATIVE
SPEC GRAV UA: 1.015 (ref 1.005–1.030)
Urobilinogen, Ur: 0.2 mg/dL (ref 0.2–1.0)
pH, UA: 5.5 (ref 5.0–7.5)

## 2017-11-15 LAB — MICROSCOPIC EXAMINATION: Epithelial Cells (non renal): NONE SEEN /hpf (ref 0–10)

## 2017-11-15 MED ORDER — CIPROFLOXACIN HCL 500 MG PO TABS
500.0000 mg | ORAL_TABLET | Freq: Once | ORAL | Status: AC
  Administered 2017-11-15: 500 mg via ORAL

## 2017-11-15 MED ORDER — LIDOCAINE HCL 2 % EX GEL
1.0000 "application " | Freq: Once | CUTANEOUS | Status: AC
  Administered 2017-11-15: 1 via URETHRAL

## 2017-11-16 ENCOUNTER — Ambulatory Visit (INDEPENDENT_AMBULATORY_CARE_PROVIDER_SITE_OTHER): Payer: Medicare HMO | Admitting: Urology

## 2017-11-16 VITALS — BP 118/77 | HR 92 | Ht 68.0 in | Wt 224.1 lb

## 2017-11-16 DIAGNOSIS — Q6211 Congenital occlusion of ureteropelvic junction: Secondary | ICD-10-CM | POA: Diagnosis not present

## 2017-11-16 DIAGNOSIS — N200 Calculus of kidney: Secondary | ICD-10-CM | POA: Diagnosis not present

## 2017-11-16 MED ORDER — CIPROFLOXACIN HCL 500 MG PO TABS: 500.0000 mg | ORAL_TABLET | Freq: Once | ORAL | Status: DC

## 2017-11-16 MED ORDER — LIDOCAINE HCL 2 % EX GEL: 1.0000 "application " | Freq: Once | CUTANEOUS | Status: AC

## 2017-11-16 NOTE — Progress Notes (Signed)
11/16/2017 1:28 PM   TAIKI BUCKWALTER 1951-12-08 [id number]  Referring provider: Maryland Pink, MD [address] [facility] [place], [place] [address]  Chief Complaint  Patient presents with  . Follow-up    HPI: 66 year old male with symptomatic left hydronephrosis.  He underwent cystoscopy with left retrograde pyelogram and placement of a left ureteral stent on 11/01/2017.  Findings consistent with left UPJ obstruction.  His left flank pain has resolved post stent placement.  He has moderate urinary frequency and urgency.  He was inadvertently scheduled for cystoscopy with stent removal today.   PMH: Past Medical History:  Diagnosis Date  . Anxiety   . Arthritis   . Diabetes mellitus without complication ([place])   . Erythrocytosis 05/18/2015  . Erythrocytosis   . History of kidney stones   . Polycythemia   . Sleep apnea    USES CPAP    Surgical History: Past Surgical History:  Procedure Laterality Date  . BLADDER STONE REMOVAL    . cataracts Bilateral   . COLONOSCOPY N/A 06/02/2015   Procedure: COLONOSCOPY;  Surgeon: [name], MD;  Location: [facility] ENDOSCOPY;  Service: Endoscopy;  Laterality: N/A;  . CYSTOSCOPY W/ RETROGRADES Left 11/01/2017   Procedure: CYSTOSCOPY WITH RETROGRADE PYELOGRAM;  Surgeon: [name], MD;  Location: [facility] ORS;  Service: Urology;  Laterality: Left;  . CYSTOSCOPY WITH STENT PLACEMENT Left 11/01/2017   Procedure: CYSTOSCOPY WITH STENT PLACEMENT;  Surgeon: [name], MD;  Location: [facility] ORS;  Service: Urology;  Laterality: Left;    Home Medications:  Allergies as of 11/16/2017      Reactions   Sertraline Hcl Other (See Comments)   Vivid dreams      Medication List        Accurate as of 11/16/17  1:28 PM. Always use your most recent med list.          acetaminophen 500 MG tablet Commonly known as:  TYLENOL Take 1,000 mg by mouth every 8 (eight) hours as needed for mild pain.   canagliflozin 100 MG Tabs  tablet Commonly known as:  INVOKANA Take 100 mg by mouth daily.   cetirizine 10 MG tablet Commonly known as:  ZYRTEC Take 10 mg by mouth daily as needed for allergies.   etodolac 200 MG capsule Commonly known as:  LODINE Take 1 capsule (200 mg total) by mouth every 8 (eight) hours.   glipiZIDE 10 MG tablet Commonly known as:  GLUCOTROL Take 10 mg by mouth 2 (two) times daily before a meal.   HYDROcodone-acetaminophen 5-325 MG tablet Commonly known as:  NORCO/VICODIN Take 1 tablet by mouth 2 (two) times daily as needed for moderate pain. Reported on 06/15/2016   ibuprofen 200 MG tablet Commonly known as:  ADVIL,MOTRIN Take 200 mg by mouth every 8 (eight) hours as needed for mild pain.   lisinopril 2.5 MG tablet Commonly known as:  PRINIVIL,ZESTRIL Take 2.5 mg by mouth at bedtime.   LORazepam 1 MG tablet Commonly known as:  ATIVAN Take 1 tablet (1 mg total) by mouth daily.   meclizine 25 MG tablet Commonly known as:  ANTIVERT Take 25 mg by mouth 3 (three) times daily as needed for dizziness.   metFORMIN 500 MG 24 hr tablet Commonly known as:  GLUCOPHAGE-XR Take 1,000 mg by mouth 2 (two) times daily.   ondansetron 4 MG disintegrating tablet Commonly known as:  ZOFRAN ODT Take 1 tablet (4 mg total) by mouth every 8 (eight) hours as needed  for nausea or vomiting.   oxybutynin 5 MG tablet Commonly known as:  DITROPAN Take 1 tablet (5 mg total) every 8 (eight) hours as needed by mouth for bladder spasms.   phenazopyridine 200 MG tablet Commonly known as:  PYRIDIUM Take 1 tablet (200 mg total) 3 (three) times daily as needed by mouth for pain (burning with urination).   pravastatin 10 MG tablet Commonly known as:  PRAVACHOL Take 10 mg by mouth at bedtime.   terbinafine 250 MG tablet Commonly known as:  LAMISIL Take 250 mg by mouth at bedtime.       Allergies:  Allergies  Allergen Reactions  . Sertraline Hcl Other (See Comments)    Vivid dreams    Family  History: Family History  Problem Relation Age of Onset  . Heart disease Mother   . Diabetes Father     Social History:  reports that  has never smoked. he has never used smokeless tobacco. He reports that he does not drink alcohol or use drugs.  ROS: Otherwise noncontributory except as per the HPI  Physical Exam: BP 118/77 (BP Location: Right Arm, Patient Position: Sitting, Cuff Size: Large)   Pulse 92   Ht 5\' 8"  (1.727 m)   Wt 224 lb 1.6 oz (101.7 kg)   BMI 34.07 kg/m   Constitutional:  Alert and oriented, No acute distress. HEENT: [place] AT, moist mucus membranes.  Trachea midline, no masses. Cardiovascular: No clubbing, cyanosis, or edema. Respiratory: Normal respiratory effort, no increased work of breathing. GI: Abdomen is soft, nontender, nondistended, no abdominal masses GU: No CVA tenderness.  Skin: No rashes, bruises or suspicious lesions. Lymph: No cervical or inguinal adenopathy. Neurologic: Grossly intact, no focal deficits, moving all 4 extremities. Psychiatric: Normal mood and affect.  Laboratory Data: Lab Results  Component Value Date   WBC 12.0 (H) 11/09/2017   HGB 16.4 11/09/2017   HCT 49.3 11/09/2017   MCV 76.8 (L) 11/09/2017   PLT 281 11/09/2017    Lab Results  Component Value Date   CREATININE 1.06 10/18/2017    Urinalysis Lab Results  Component Value Date   SPECGRAV 1.015 11/15/2017   PHUR 5.5 11/15/2017   COLORU Yellow 11/15/2017   APPEARANCEUR Cloudy (A) 11/15/2017   LEUKOCYTESUR 1+ (A) 11/15/2017   PROTEINUR 2+ (A) 11/15/2017   GLUCOSEU 3+ (A) 11/15/2017   KETONESU Negative 11/15/2017   RBCU 3+ (A) 11/15/2017   BILIRUBINUR Negative 11/15/2017   UUROB 0.2 11/15/2017   NITRITE Negative 11/15/2017    Lab Results  Component Value Date   LABMICR See below: 11/15/2017   WBCUA 0-5 11/15/2017   RBCUA 11-30 (A) 11/15/2017   LABEPIT None seen 11/15/2017   MUCUS Present (A) 11/15/2017   BACTERIA Few (A) 11/15/2017    Pertinent  Imaging:  Results for orders placed during the hospital encounter of 10/18/17  CT Renal Stone Study   Narrative CLINICAL DATA:  66 year old male with left-sided lower back pain extending to left flank for the past 3 days worse over the past 8 hours. Initial encounter.  EXAM: CT ABDOMEN AND PELVIS WITHOUT CONTRAST  TECHNIQUE: Multidetector CT imaging of the abdomen and pelvis was performed following the standard protocol without IV contrast.  COMPARISON:  08/29/2012 CT.  FINDINGS: Lower chest: Minimal scarring lung bases. Heart size within normal limits with prominent 3 vessel coronary artery calcification. Calcified aortic valve. Slightly prominent main pulmonary artery.  Hepatobiliary: Taking into account limitation by non contrast imaging, no worrisome hepatic lesion. No calcified  gallstones.  Pancreas: Taking into account limitation by non contrast imaging, no pancreatic mass or inflammation.  Spleen: Taking into account limitation by non contrast imaging, no splenic mass or enlargement.  Adrenals/Urinary Tract: Chronic left hydronephrosis with left ureteral pelvic junction obstruction configuration. Within the dilated left calices are a 1.2 cm upper pole and 8 mm lower pole stone. 6.1 cm and 1.2 cm left lower pole renal cyst. No left ureteral obstructing stone.  No right renal or ureteral obstructing stone or hydronephrosis. Taking into account limitation by non contrast imaging, no worrisome right renal lesion or adrenal lesion.  Noncontrast filled views of the urinary bladder without stone or mass identified. Slight impression upon the bladder base by prostate gland.  Stomach/Bowel: Ligament of Treitz just to the right of midline. Majority of small bowel within the right abdomen. Cecum remains on the right. No extraluminal bowel inflammatory process, free fluid or free air. No bowel containing hernia. Small duodenal diverticulum incidentally  noted.  Vascular/Lymphatic: Mild plaque abdominal aorta with mild ectasia with maximal transverse dimension of 2.3 cm. Plaque iliac arteries and femoral arteries.  Top-normal size external iliac lymph nodes greater on left without change.  Reproductive: Heterogeneous prominence size prostate gland.  Other: Mild diastases rectus muscles.  Musculoskeletal: Bony overgrowth right iliac wing unchanged. No osseous destructive lesion.  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 Within the dilated left calices are a 1.2 cm upper pole and 8 mm lower pole stone. 6.1 cm and 1.2 cm left lower pole renal cyst. No left ureteral obstructing stone.  Ligament of Treitz just to the right of midline. Majority of small bowel within the right abdomen. Cecum remains on the right. No extraluminal bowel inflammatory process, free fluid or free air.  Heterogeneous prominent size prostate gland. Clinical and laboratory correlation recommended.  Aortic Atherosclerosis (ICD10-I70.0).  Prominent 3 vessel coronary artery calcification.   Electronically Signed   By: Genia Del M.D.   On: 10/18/2017 07:28     Assessment & Plan:   I explained to Mr. Mostek removal of his stent today would result in recurrent obstruction and most likely renal colic.  We will schedule a renal scan to determine function of the left kidney and a satisfactory function potential treatment of his UPJ obstruction were discussed including endoscopic treatment and laparoscopic UPJ repair.  1. Kidney stones  - lidocaine (XYLOCAINE) 2 % jelly 1 application - ciprofloxacin (CIPRO) tablet 500 mg  2. Hydronephrosis with ureteropelvic junction (UPJ) obstruction  - [facility] Renal Imaging Flow W/Pharm; Future   [name], [place] [address], [place], [place] [address] [phone]

## 2017-11-20 NOTE — Progress Notes (Signed)
appt r/s

## 2017-12-02 ENCOUNTER — Ambulatory Visit
Admission: RE | Admit: 2017-12-02 | Discharge: 2017-12-02 | Disposition: A | Discharge status: Home / Self Care | Payer: Medicare HMO | Source: Ambulatory Visit | Attending: Urology | Admitting: Urology

## 2017-12-02 DIAGNOSIS — N131 Hydronephrosis with ureteral stricture, not elsewhere classified: Secondary | ICD-10-CM | POA: Diagnosis present

## 2017-12-02 DIAGNOSIS — Q6211 Congenital occlusion of ureteropelvic junction: Secondary | ICD-10-CM

## 2017-12-02 HISTORY — DX: Congenital occlusion of ureteropelvic junction: Q62.11

## 2017-12-02 MED ORDER — TECHNETIUM TC 99M MERTIATIDE
5.0000 | Freq: Once | INTRAVENOUS | Status: AC | PRN
  Administered 2017-12-02: 5.22 via INTRAVENOUS

## 2017-12-02 MED ORDER — FUROSEMIDE 10 MG/ML IJ SOLN
20.0000 mg | Freq: Once | INTRAMUSCULAR | Status: AC
  Administered 2017-12-02: 20 mg via INTRAVENOUS
  Filled 2017-12-02: qty 2

## 2017-12-09 ENCOUNTER — Telehealth: Payer: Self-pay

## 2017-12-09 NOTE — Telephone Encounter (Signed)
Spoke with pt in reference to kidney function and needing an OV to discuss options. Pt voiced understanding. OV made.

## 2017-12-09 NOTE — Telephone Encounter (Signed)
-----   Message from [name], MD sent at 12/08/2017  9:32 AM EST ----- Left kidney has 33% function- recc f/u appt to discuss treatment options

## 2017-12-12 ENCOUNTER — Ambulatory Visit (INDEPENDENT_AMBULATORY_CARE_PROVIDER_SITE_OTHER): Payer: Medicare HMO | Admitting: Urology

## 2017-12-12 ENCOUNTER — Encounter: Payer: Self-pay | Admitting: Urology

## 2017-12-12 VITALS — BP 115/70 | HR 98 | Ht 68.0 in | Wt 220.0 lb

## 2017-12-12 DIAGNOSIS — N200 Calculus of kidney: Secondary | ICD-10-CM | POA: Diagnosis not present

## 2017-12-12 DIAGNOSIS — N135 Crossing vessel and stricture of ureter without hydronephrosis: Secondary | ICD-10-CM | POA: Diagnosis not present

## 2017-12-14 DIAGNOSIS — N135 Crossing vessel and stricture of ureter without hydronephrosis: Secondary | ICD-10-CM | POA: Insufficient documentation

## 2017-12-14 DIAGNOSIS — N200 Calculus of kidney: Secondary | ICD-10-CM | POA: Insufficient documentation

## 2017-12-14 NOTE — Progress Notes (Signed)
12/12/2017 12:30 PM   Benjamin [name] 02-03-1951 [id number]  Referring provider: Maryland Pink, MD [address] [facility] [place], [place] [address]  Chief Complaint  Patient presents with  . renal function    HPI: 66 year old male presents for follow-up.  He has a history of a mild, asymptomatic left UPJ obstruction however was seen in the ED last month for severe renal colic.  CT showed left hydronephrosis with nonobstructing left renal calculi with the largest stone measuring approximately 14 mm.  He underwent cystoscopy with left retrograde pyelogram which showed significant narrowing at the UPJ.  A ureteral stent was placed with resolution of his pain.  A recent renal scan showed 67% function of the right kidney and 33% function of the left kidney.  He presents today to discuss management options.  He has mild stent symptoms.   PMH: Past Medical History:  Diagnosis Date  . Anxiety   . Arthritis   . Diabetes mellitus without complication ([name])   . Erythrocytosis 05/18/2015  . Erythrocytosis   . History of kidney stones   . 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 Polycythemia   . Sleep apnea    USES CPAP    Surgical History: Past Surgical History:  Procedure Laterality Date  . BLADDER STONE REMOVAL    . cataracts Bilateral   . COLONOSCOPY N/A 06/02/2015   Procedure: COLONOSCOPY;  Surgeon: [name], MD;  Location: [facility] ENDOSCOPY;  Service: Endoscopy;  Laterality: N/A;  . CYSTOSCOPY W/ RETROGRADES Left 11/01/2017   Procedure: CYSTOSCOPY WITH RETROGRADE PYELOGRAM;  Surgeon: [name], MD;  Location: [facility] ORS;  Service: Urology;  Laterality: Left;  . CYSTOSCOPY WITH STENT PLACEMENT Left 11/01/2017   Procedure: CYSTOSCOPY WITH STENT PLACEMENT;  Surgeon: [name], MD;  Location: [facility] ORS;  Service: Urology;  Laterality: Left;    Home Medications:  Allergies as  of 12/12/2017      Reactions   Sertraline Hcl Other (See Comments)   Vivid dreams      Medication List        Accurate as of 12/12/17 11:59 PM. Always use your most recent med list.          canagliflozin 100 MG Tabs tablet Commonly known as:  INVOKANA Take 100 mg by mouth daily.   etodolac 200 MG capsule Commonly known as:  LODINE Take 1 capsule (200 mg total) by mouth every 8 (eight) hours.   glipiZIDE 10 MG tablet Commonly known as:  GLUCOTROL Take 10 mg by mouth 2 (two) times daily before a meal.   HYDROcodone-acetaminophen 5-325 MG tablet Commonly known as:  NORCO/VICODIN Take 1 tablet by mouth 2 (two) times daily as needed for moderate pain. Reported on 06/15/2016   lisinopril 2.5 MG tablet Commonly known as:  PRINIVIL,ZESTRIL Take 2.5 mg by mouth at bedtime.   LORazepam 1 MG tablet Commonly known as:  ATIVAN Take 1 tablet (1 mg total) by mouth daily.   metFORMIN 500 MG 24 hr tablet Commonly known as:  GLUCOPHAGE-XR Take 1,000 mg by mouth 2 (two) times daily.   oxybutynin 5 MG tablet Commonly known as:  DITROPAN Take 1 tablet (5 mg total) every 8 (eight) hours as needed by mouth for bladder spasms.   pravastatin 10 MG tablet Commonly known as:  PRAVACHOL Take 10 mg by mouth at bedtime.   terbinafine 250 MG tablet Commonly known as:  LAMISIL Take 250 mg by mouth at bedtime.       Allergies:  Allergies  Allergen Reactions  . Sertraline Hcl Other (See Comments)    Vivid dreams    Family History: Family History  Problem Relation Age of Onset  . Heart disease Mother   . Diabetes Father     Social History:  reports that  has never smoked. he has never used smokeless tobacco. He reports that he does not drink alcohol or use drugs.  ROS: UROLOGY Frequent Urination?: Yes Hard to postpone urination?: No Burning/pain with urination?: No Get up at night to urinate?: Yes Leakage of urine?: No Urine stream starts and stops?: No Trouble starting  stream?: No Do you have to strain to urinate?: No Blood in urine?: No Urinary tract infection?: No Sexually transmitted disease?: No Injury to kidneys or bladder?: No Painful intercourse?: No Weak stream?: No Erection problems?: No Penile pain?: No  Gastrointestinal Nausea?: No Vomiting?: No Indigestion/heartburn?: No Diarrhea?: No Constipation?: No  Constitutional Fever: No Night sweats?: No Weight loss?: No Fatigue?: No  Skin Skin rash/lesions?: No Itching?: No  Eyes Blurred vision?: No Double vision?: No  Ears/Nose/Throat Sore throat?: No Sinus problems?: No  Hematologic/Lymphatic Swollen glands?: No Easy bruising?: No  Cardiovascular Leg swelling?: No Chest pain?: No  Respiratory Cough?: No Shortness of breath?: No  Endocrine Excessive thirst?: No  Musculoskeletal Back pain?: No Joint pain?: No  Neurological Headaches?: No Dizziness?: No  Psychologic Depression?: No Anxiety?: No  Physical Exam: BP 115/70   Pulse 98   Ht 5\' 8"  (1.727 m)   Wt 220 lb (99.8 kg)   BMI 33.45 kg/m   Constitutional:  Alert and oriented, No acute distress. HEENT: [place] AT, moist mucus membranes.  Trachea midline, no masses. Cardiovascular: No clubbing, cyanosis, or edema. Respiratory: Normal respiratory effort, no increased work of breathing. GI: Abdomen is soft, nontender, nondistended, no abdominal masses GU: No CVA tenderness.  Skin: No rashes, bruises or suspicious lesions. Lymph: No cervical or inguinal adenopathy. Neurologic: Grossly intact, no focal deficits, moving all 4 extremities. Psychiatric: Normal mood and affect.  Laboratory Data: Lab Results  Component Value Date   WBC 12.0 (H) 11/09/2017   HGB 16.4 11/09/2017   HCT 49.3 11/09/2017   MCV 76.8 (L) 11/09/2017   PLT 281 11/09/2017    Lab Results  Component Value Date   CREATININE 1.06 10/18/2017     Assessment & Plan:    1. Ureteropelvic junction (UPJ) obstruction,  left Symptomatic UPJ obstruction with diminished function of the left kidney.  Discussed treatment for preservation of his renal function.  A percutaneous approach with endopyelotomy was discussed as well as a laparoscopic approach.  He requested referral to [facility] for further evaluation and treatment.  2. Nephrolithiasis As above.   [name], [place] [address] [place], [place] [address] [phone]

## 2017-12-15 ENCOUNTER — Telehealth: Payer: Self-pay | Admitting: Urology

## 2017-12-15 NOTE — Telephone Encounter (Signed)
Referral to [facility]  

## 2017-12-15 NOTE — Telephone Encounter (Signed)
-----   Message from [name], MD sent at 12/14/2017 12:35 PM EST ----- Requested referral [facility] for his UPJ obstruction/nephrolithiasis

## 2017-12-28 ENCOUNTER — Other Ambulatory Visit: Payer: Self-pay

## 2017-12-28 MED ORDER — OXYBUTYNIN CHLORIDE 5 MG PO TABS: 5.0000 mg | ORAL_TABLET | Freq: Three times a day (TID) | ORAL | 1 refills | Status: DC | PRN

## 2018-01-12 ENCOUNTER — Encounter: Payer: Self-pay | Admitting: Hematology and Oncology

## 2018-01-12 ENCOUNTER — Other Ambulatory Visit: Payer: Self-pay

## 2018-01-12 ENCOUNTER — Inpatient Hospital Stay (HOSPITAL_BASED_OUTPATIENT_CLINIC_OR_DEPARTMENT_OTHER): Payer: Medicare HMO | Admitting: Hematology and Oncology

## 2018-01-12 ENCOUNTER — Inpatient Hospital Stay: Payer: Medicare HMO

## 2018-01-12 ENCOUNTER — Inpatient Hospital Stay: Payer: Medicare HMO | Attending: Hematology and Oncology

## 2018-01-12 VITALS — BP 120/78 | HR 101 | Temp 97.4°F | Resp 16 | Ht 67.0 in | Wt 221.9 lb

## 2018-01-12 VITALS — BP 126/78 | HR 90

## 2018-01-12 DIAGNOSIS — M129 Arthropathy, unspecified: Secondary | ICD-10-CM

## 2018-01-12 DIAGNOSIS — F419 Anxiety disorder, unspecified: Secondary | ICD-10-CM | POA: Insufficient documentation

## 2018-01-12 DIAGNOSIS — Z79899 Other long term (current) drug therapy: Secondary | ICD-10-CM | POA: Diagnosis not present

## 2018-01-12 DIAGNOSIS — N2889 Other specified disorders of kidney and ureter: Secondary | ICD-10-CM | POA: Insufficient documentation

## 2018-01-12 DIAGNOSIS — Z7984 Long term (current) use of oral hypoglycemic drugs: Secondary | ICD-10-CM | POA: Diagnosis not present

## 2018-01-12 DIAGNOSIS — D751 Secondary polycythemia: Secondary | ICD-10-CM

## 2018-01-12 DIAGNOSIS — G4730 Sleep apnea, unspecified: Secondary | ICD-10-CM

## 2018-01-12 DIAGNOSIS — E119 Type 2 diabetes mellitus without complications: Secondary | ICD-10-CM | POA: Diagnosis not present

## 2018-01-12 DIAGNOSIS — Z87442 Personal history of urinary calculi: Secondary | ICD-10-CM | POA: Diagnosis not present

## 2018-01-12 LAB — COMPREHENSIVE METABOLIC PANEL
ALT: 17 U/L (ref 17–63)
AST: 29 U/L (ref 15–41)
Albumin: 4.1 g/dL (ref 3.5–5.0)
Alkaline Phosphatase: 61 U/L (ref 38–126)
Anion gap: 12 (ref 5–15)
BUN: 13 mg/dL (ref 6–20)
CO2: 20 mmol/L — ABNORMAL LOW (ref 22–32)
Calcium: 9.1 mg/dL (ref 8.9–10.3)
Chloride: 100 mmol/L — ABNORMAL LOW (ref 101–111)
Creatinine, Ser: 1 mg/dL (ref 0.61–1.24)
GFR calc Af Amer: >60 mL/min (ref >60)
GFR calc non Af Amer: >60 mL/min (ref >60)
Glucose, Bld: 202 mg/dL — ABNORMAL HIGH (ref 65–99)
Potassium: 3.7 mmol/L (ref 3.5–5.1)
Sodium: 132 mmol/L — ABNORMAL LOW (ref 135–145)
Total Bilirubin: 0.7 mg/dL (ref 0.3–1.2)
Total Protein: 7.3 g/dL (ref 6.5–8.1)

## 2018-01-12 LAB — CBC WITH DIFFERENTIAL/PLATELET
Basophils Absolute: 0.1 10*3/uL (ref 0–0.1)
Basophils Relative: 1 %
Eosinophils Absolute: 0.2 10*3/uL (ref 0–0.7)
Eosinophils Relative: 3 %
HCT: 49.2 % (ref 40.0–52.0)
Hemoglobin: 16.5 g/dL (ref 13.0–18.0)
Lymphocytes Relative: 35 %
Lymphs Abs: 2.8 10*3/uL (ref 1.0–3.6)
MCH: 26.1 pg (ref 26.0–34.0)
MCHC: 33.4 g/dL (ref 32.0–36.0)
MCV: 78.2 fL — ABNORMAL LOW (ref 80.0–100.0)
Monocytes Absolute: 0.6 10*3/uL (ref 0.2–1.0)
Monocytes Relative: 8 %
Neutro Abs: 4.2 10*3/uL (ref 1.4–6.5)
Neutrophils Relative %: 53 %
Platelets: 263 10*3/uL (ref 150–440)
RBC: 6.29 MIL/uL — ABNORMAL HIGH (ref 4.40–5.90)
RDW: 16 % — ABNORMAL HIGH (ref 11.5–14.5)
WBC: 7.9 10*3/uL (ref 3.8–10.6)

## 2018-01-12 LAB — FERRITIN: Ferritin: 14 ng/mL — ABNORMAL LOW (ref 24–336)

## 2018-01-12 NOTE — Progress Notes (Signed)
Patient is to be scheduled for kidney surgery to remove blockage and stone.

## 2018-01-12 NOTE — Progress Notes (Signed)
[facility] day:  01/12/18  Chief Complaint: Benjamin [name] is a 67 y.o. male with erythrocytosis who is seen for 6 month assessment.  HPI: The patient was last seen in the hematology clinic on 07/11/2017.  At that time, he denied any complaints.  Exam was unremarkable.  Hematocrit was 48.8.  He underwent phlebotomy.  He underwent phlebotomy on 09/12/2017 and 11/09/2017.  Hematocrit ranged 48.8 - 49.7.  During the interim, patient is doing well overall. He denies headaches and visual changes. Patient has no shortness of breath or chest pain. Patient denies smoking. His has a history of OSAH syndrome. He is on CPAP therapy at night. His CPAP therapy is managed by [facility].  He does not use supplemental testosterone preparations.   He denies recent infections.  He is eating well. His weight is up 1 pound since his last visit.   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  He has a scheduled laparoscopic endopyelotomy in February.    Past Medical History:  Diagnosis Date  . Anxiety   . Arthritis   . Diabetes mellitus without complication ([place])   . Erythrocytosis 05/18/2015  . Erythrocytosis   . History of kidney stones   . 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 Polycythemia   . Sleep apnea    USES CPAP    Past Surgical History:  Procedure Laterality Date  . BLADDER STONE REMOVAL    . cataracts Bilateral   . COLONOSCOPY N/A 06/02/2015   Procedure: COLONOSCOPY;  Surgeon: [name], MD;  Location: [facility] ENDOSCOPY;  Service: Endoscopy;  Laterality: N/A;  . CYSTOSCOPY W/ RETROGRADES Left 11/01/2017   Procedure: CYSTOSCOPY WITH RETROGRADE PYELOGRAM;  Surgeon: [name], MD;  Location: [facility] ORS;  Service: Urology;  Laterality: Left;  . CYSTOSCOPY WITH STENT PLACEMENT Left 11/01/2017   Procedure: CYSTOSCOPY WITH STENT PLACEMENT;   Surgeon: [name], MD;  Location: [facility] ORS;  Service: Urology;  Laterality: Left;    Family History  Problem Relation Age of Onset  . Heart disease Mother   . Diabetes Father     Social History:  reports that  has never smoked. he has never used smokeless tobacco. He reports that he does not drink alcohol or use drugs.  He is retired.  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  He lives in [place].  The patient is alone today.  Allergies:  Allergies  Allergen Reactions  . Sertraline Hcl Other (See Comments)    Vivid dreams    Current Medications: Current Outpatient Medications  Medication Sig Dispense Refill  . glipiZIDE (GLUCOTROL) 10 MG tablet Take 10 mg by mouth 2 (two) times daily before a meal.    . HYDROcodone-acetaminophen (NORCO/VICODIN) 5-325 MG tablet Take 1 tablet by mouth 2 (two) times daily as needed for moderate pain. Reported on 06/15/2016 30 tablet 0  . lisinopril (PRINIVIL,ZESTRIL) 2.5 MG tablet Take 2.5 mg by mouth at bedtime.    Marland Kitchen LORazepam (ATIVAN) 1 MG tablet Take 1 tablet (1 mg total) by mouth daily. (Patient taking differently: Take 1 mg by mouth 2 (two) times daily. ) 15 tablet 0  . metFORMIN (GLUCOPHAGE-XR) 500 MG 24 hr tablet Take 1,000 mg by mouth 2 (two) times daily.    Marland Kitchen oxybutynin (DITROPAN) 5 MG tablet Take 1 tablet (5 mg total)  by mouth every 8 (eight) hours as needed for bladder spasms. 30 tablet 1  . pravastatin (PRAVACHOL) 10 MG tablet Take 10 mg by mouth at bedtime.     . terbinafine (LAMISIL) 250 MG tablet Take 250 mg by mouth at bedtime.    . canagliflozin (INVOKANA) 100 MG TABS tablet Take 100 mg by mouth daily.     Current Facility-Administered Medications  Medication Dose Route Frequency Provider Last Rate Last Dose  . ciprofloxacin (CIPRO) tablet 500 mg  500 mg Oral Once Stoioff, Scott C, MD      . lidocaine (XYLOCAINE) 2 % jelly 1 application  1 application Urethral Once Stoioff, Ronda Fairly, MD         Review of Systems:  GENERAL:  Feels "ok".  No complaints.  No fevers or sweats.  Weight down 6 pounds. PERFORMANCE STATUS (ECOG):  0 HEENT:  No visual changes, runny nose, sore throat, mouth sores or tenderness. Lungs: No shortness of breath or cough.  No hemoptysis. Cardiac:  No chest pain, palpitations, orthopnea, or PND. GI:  No nausea, vomiting, diarrhea, constipation, melena or hematochezia. GU:  No urgency, frequency, dysuria, or hematuria.  UPJ obstruction in his LEFT kidney. Musculoskeletal:  No back pain.  No joint pain.  No muscle tenderness. Extremities:  No pain or swelling. Skin:  No rashes or skin changes. Neuro:  No headache, numbness or weakness, balance or coordination issues. Endocrine:  Diabetes.  No thyroid issues, hot flashes or night sweats. Psych:  No mood changes, depression or anxiety. Pain:  No focal pain. Review of systems:  All other systems reviewed and found to be negative.  Physical Exam: Blood pressure 120/78, pulse (!) 101, temperature (!) 97.4 F (36.3 C), temperature source Tympanic, resp. rate 16, height _0  (1.702 m), weight 221 lb 14.4 oz (100.7 kg). GENERAL:  Well developed, well nourished, gentleman sitting comfortably in the exam room in no acute distress. MENTAL STATUS:  Alert and oriented to person, place and time. HEAD:  Short gray hair.  Normocephalic, atraumatic, face symmetric, no Cushingoid features. EYES:  Blue eyes.  Eyes injected, tired.  Pupils equal round and reactive to light and accomodation.  No scleral icterus. ENT:  Oropharynx clear without lesion.  Tongue normal. Mucous membranes moist.  RESPIRATORY:  Clear to auscultation without rales, wheezes or rhonchi. CARDIOVASCULAR:  Regular rate and rhythm without murmur, rub or gallop. ABDOMEN:  Umbilical hernia.  Soft, non-tender, with active bowel sounds, and no hepatosplenomegaly.  No masses. SKIN:  No rashes, ulcers or lesions. EXTREMITIES: No edema, no skin discoloration or  tenderness.  No palpable cords. LYMPH NODES: No palpable cervical, supraclavicular, axillary or inguinal adenopathy  NEUROLOGICAL: Unremarkable. PSYCH:  Appropriate.   Appointment on 01/12/2018  Component Date Value Ref Range Status  . Sodium 01/12/2018 132* 135 - 145 mmol/L Final  . Potassium 01/12/2018 3.7  3.5 - 5.1 mmol/L Final  . Chloride 01/12/2018 100* 101 - 111 mmol/L Final  . CO2 01/12/2018 20* 22 - 32 mmol/L Final  . Glucose, Bld 01/12/2018 202* 65 - 99 mg/dL Final  . BUN 01/12/2018 13  6 - 20 mg/dL Final  . Creatinine, Ser 01/12/2018 1.00  0.61 - 1.24 mg/dL Final  . Calcium 01/12/2018 9.1  8.9 - 10.3 mg/dL Final  . Total Protein 01/12/2018 7.3  6.5 - 8.1 g/dL Final  . Albumin 01/12/2018 4.1  3.5 - 5.0 g/dL Final  . AST 01/12/2018 29  15 - 41 U/L Final  . ALT  01/12/2018 17  17 - 63 U/L Final  . Alkaline Phosphatase 01/12/2018 61  38 - 126 U/L Final  . Total Bilirubin 01/12/2018 0.7  0.3 - 1.2 mg/dL Final  . GFR calc non Af Amer 01/12/2018 >60  >60 mL/min Final  . GFR calc Af Amer 01/12/2018 >60  >60 mL/min Final   Comment: (NOTE) The eGFR has been calculated using the CKD EPI equation. This calculation has not been validated in all clinical situations. eGFR's persistently <60 mL/min signify possible Chronic Kidney Disease.   Georgiann Hahn gap 01/12/2018 12  5 - 15 Final   Performed at [facility], [address] [place], [place] [address]  . WBC 01/12/2018 7.9  3.8 - 10.6 K/uL Final  . RBC 01/12/2018 6.29* 4.40 - 5.90 MIL/uL Final  . Hemoglobin 01/12/2018 16.5  13.0 - 18.0 g/dL Final  . HCT 01/12/2018 49.2  40.0 - 52.0 % Final  . MCV 01/12/2018 78.2* 80.0 - 100.0 fL Final  . MCH 01/12/2018 26.1  26.0 - 34.0 pg Final  . MCHC 01/12/2018 33.4  32.0 - 36.0 g/dL Final  . RDW 01/12/2018 16.0* 11.5 - 14.5 % Final  . Platelets 01/12/2018 263  150 - 440 K/uL Final  . Neutrophils Relative % 01/12/2018 53  % Final  . Neutro Abs 01/12/2018 4.2  1.4 - 6.5 K/uL Final  .  Lymphocytes Relative 01/12/2018 35  % Final  . Lymphs Abs 01/12/2018 2.8  1.0 - 3.6 K/uL Final  . Monocytes Relative 01/12/2018 8  % Final  . Monocytes Absolute 01/12/2018 0.6  0.2 - 1.0 K/uL Final  . Eosinophils Relative 01/12/2018 3  % Final  . Eosinophils Absolute 01/12/2018 0.2  0 - 0.7 K/uL Final  . Basophils Relative 01/12/2018 1  % Final  . Basophils Absolute 01/12/2018 0.1  0 - 0.1 K/uL Final   Performed at [facility], [address], [place],  [address]    Assessment:  Benjamin [name] is a 67 y.o. 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  Red cell volume study at [facility] on 03/12/2004 revealed a normal red cell mass and no evidence of polycythemia vera.  BCR-ABL and JAK2 V617F were negative in 06/2010.  Epo level was 37.8.  Additional testing noted an epo of 12.9, LAP score 108, B12 of 501, and ferritin 321.  Abdominal ultrasound revealed no splenomegaly.  He denies any tobacco or testosterone use.  He has sleep apnea and uses CPAP.  He initially underwent phlebotomy every 2 weeks.  He now undergoes phlebotomy every 2 months. He undergoes a phlebotomy of 400 cc if his hematocrit is > 47.  Last phlebotomy was on 11/09/2017 for a hematocrit of HCT 49.3 and hemoglobin 16.4.  He becomes more fatigued when he needs a phlebotomy.  He denies any history of thrombosis.  Labs on 06/15/2016 revealed a hematocrit of 48.1, hemoglobin 16.0, MCV 71.9, platelets 261,000, WBC 8300 with an ANC of 4000.  Ferritin was 19.  Iron studies revealed a saturation of 4% and TIBC of 462.  Epo level was 12.8 (normal).   JAK2 V617F and exon 12-15 were negative.  Diet is normal.  Colonoscopy on 06/02/2015 revealed 2 polyps (6 and 20 mm) in the cecum.  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  Symptomatically, he denies any complaints.  He has UPJ obstruction in his LEFT kidney and is scheduled for surgery in 01/2018.  Exam is unremarkable.  Hematocrit is  49.2.  Plan: 1.  Labs today:  CBC with diff, CMP, ferritin. 2.  Review labs from last visit. 3.  Hematocrit 49.2. Goal for patient is < 47. He notes improvement in symptoms after phlebotomy. Will proceed with a small volume phlebotomy today.  4.  RTC every 2 months x 2 for CBC +/- phlebotomy. 5.  RTC in 6 months for MD assessment, labs (CBC with diff, CMP, ferritin) +/- phlebotomy.   Honor Loh, NP  01/12/2018, 2:28 PM  I saw and evaluated the patient, participating in the key portions of the service and reviewing pertinent diagnostic studies and records.  I reviewed the nurse practitioner's note and agree with the findings and the plan.  The assessment and plan were discussed with the patient. A few questions were asked by the patient and answered.   Nolon Stalls, MD 01/12/2018,2:28 PM

## 2018-01-15 ENCOUNTER — Encounter: Payer: Self-pay | Admitting: Hematology and Oncology

## 2018-01-31 ENCOUNTER — Telehealth: Payer: Self-pay

## 2018-01-31 DIAGNOSIS — R350 Frequency of micturition: Secondary | ICD-10-CM

## 2018-01-31 NOTE — Telephone Encounter (Signed)
Pt called c/o increased frequency and urgency. Pt stated that its going to be a while before he can have his kidney surgery due to heart complications. Pt stated that he is going to the bathroom q1hr with severe urgency. Pt requested another medication to help symptoms. Please advise.

## 2018-01-31 NOTE — Telephone Encounter (Signed)
Recc UA/Urine cx to make sure he does not have a UTI

## 2018-02-01 ENCOUNTER — Ambulatory Visit (INDEPENDENT_AMBULATORY_CARE_PROVIDER_SITE_OTHER): Payer: Medicare HMO

## 2018-02-01 DIAGNOSIS — R350 Frequency of micturition: Secondary | ICD-10-CM | POA: Diagnosis not present

## 2018-02-01 LAB — MICROSCOPIC EXAMINATION: Epithelial Cells (non renal): NONE SEEN /hpf (ref 0–10)

## 2018-02-01 LAB — URINALYSIS, COMPLETE
BILIRUBIN UA: NEGATIVE
KETONES UA: NEGATIVE
NITRITE UA: NEGATIVE
SPEC GRAV UA: 1.01 (ref 1.005–1.030)
UUROB: 0.2 mg/dL (ref 0.2–1.0)
pH, UA: 5.5 (ref 5.0–7.5)

## 2018-02-01 NOTE — Progress Notes (Signed)
Pt presents today with c/o urinary frequency, urgency, hard to postpone urination, and dysuria. A clean catch was obtained for u/a and cx.

## 2018-02-01 NOTE — Telephone Encounter (Signed)
Spoke with pt in reference to s/s and needing to check a urine. Pt voiced understanding. Pt was added to nurse schedule for today.

## 2018-02-04 LAB — CULTURE, URINE COMPREHENSIVE

## 2018-02-06 ENCOUNTER — Telehealth: Payer: Self-pay

## 2018-02-06 NOTE — Telephone Encounter (Signed)
-----   Message from [name], MD sent at 02/06/2018 10:46 AM EST ----- Urine culture was negative for infection.  What is he taking for bladder irritation?

## 2018-02-06 NOTE — Telephone Encounter (Signed)
Patient notified he states he is taking the oxybutinin but it is not helping at all.He states he is still having extreme frequency and dysuria. It is hard for him to sleep and he is needing relief. How should he proceed?

## 2018-02-07 ENCOUNTER — Telehealth: Payer: Self-pay

## 2018-02-07 MED ORDER — MIRABEGRON ER 50 MG PO TB24: 50.0000 mg | ORAL_TABLET | Freq: Every day | ORAL | 3 refills | Status: DC

## 2018-02-07 NOTE — Telephone Encounter (Signed)
Would recommend Rx Myrbetriq 50 mg daily.  Where does he want the Rx sent?

## 2018-02-07 NOTE — Telephone Encounter (Signed)
Medication sent into pharmacy  

## 2018-02-07 NOTE — Telephone Encounter (Signed)
Pt called stating myrbetriq was going to cost him $130/month. Pt inquired about another medication. Pt stated that he would rather suffer than pay that much for medications. Please advise.

## 2018-02-08 NOTE — Telephone Encounter (Signed)
Spoke with pt in reference to myrbetriq samples. Pt stated that he will come by tomorrow to pick them up.

## 2018-02-08 NOTE — Telephone Encounter (Signed)
Can give him at least 4 weeks of samples to see if this helps his symptoms.  Myrbetriq 50 mg

## 2018-02-17 ENCOUNTER — Other Ambulatory Visit: Payer: Self-pay | Admitting: Family Medicine

## 2018-02-17 MED ORDER — OXYBUTYNIN CHLORIDE 5 MG PO TABS: 5.0000 mg | ORAL_TABLET | Freq: Three times a day (TID) | ORAL | 1 refills | Status: DC | PRN

## 2018-02-27 DIAGNOSIS — I2584 Coronary atherosclerosis due to calcified coronary lesion: Secondary | ICD-10-CM | POA: Insufficient documentation

## 2018-02-27 DIAGNOSIS — I2510 Atherosclerotic heart disease of native coronary artery without angina pectoris: Secondary | ICD-10-CM | POA: Insufficient documentation

## 2018-02-28 ENCOUNTER — Telehealth: Payer: Self-pay | Admitting: *Deleted

## 2018-02-28 NOTE — Telephone Encounter (Signed)
Patient appts were cancelled and Rescheduled by another scheduler and time of Phlebotomy was  scheduled at 1:00, Infusion appts cant not be scheduled between 12:00-1:15 so 05/09/18 time was changed. Patient also stated that he had another appt.on 03/21/18 the same day and needed to Reschedule.  Appt was changed to 03/23/18

## 2018-03-13 ENCOUNTER — Emergency Department
Admission: EM | Admit: 2018-03-13 | Discharge: 2018-03-13 | Disposition: A | Discharge status: Home / Self Care | Payer: Medicare HMO | Attending: Emergency Medicine | Admitting: Emergency Medicine

## 2018-03-13 ENCOUNTER — Emergency Department: Payer: Medicare HMO

## 2018-03-13 ENCOUNTER — Other Ambulatory Visit: Payer: Self-pay

## 2018-03-13 DIAGNOSIS — Z79899 Other long term (current) drug therapy: Secondary | ICD-10-CM | POA: Diagnosis not present

## 2018-03-13 DIAGNOSIS — T83092A Other mechanical complication of nephrostomy catheter, initial encounter: Secondary | ICD-10-CM | POA: Diagnosis not present

## 2018-03-13 DIAGNOSIS — Y658 Other specified misadventures during surgical and medical care: Secondary | ICD-10-CM | POA: Insufficient documentation

## 2018-03-13 DIAGNOSIS — Z7984 Long term (current) use of oral hypoglycemic drugs: Secondary | ICD-10-CM | POA: Insufficient documentation

## 2018-03-13 DIAGNOSIS — E119 Type 2 diabetes mellitus without complications: Secondary | ICD-10-CM | POA: Insufficient documentation

## 2018-03-13 DIAGNOSIS — N99528 Other complication of other external stoma of urinary tract: Secondary | ICD-10-CM

## 2018-03-13 LAB — CBC WITH DIFFERENTIAL/PLATELET
BASOS PCT: 1 %
Basophils Absolute: 0.1 10*3/uL (ref 0–0.1)
EOS ABS: 0.4 10*3/uL (ref 0–0.7)
EOS PCT: 4 %
HCT: 46.1 % (ref 40.0–52.0)
HEMOGLOBIN: 15.1 g/dL (ref 13.0–18.0)
LYMPHS ABS: 3 10*3/uL (ref 1.0–3.6)
Lymphocytes Relative: 33 %
MCH: 25.1 pg — AB (ref 26.0–34.0)
MCHC: 32.8 g/dL (ref 32.0–36.0)
MCV: 76.6 fL — ABNORMAL LOW (ref 80.0–100.0)
MONO ABS: 0.5 10*3/uL (ref 0.2–1.0)
MONOS PCT: 6 %
Neutro Abs: 5.2 10*3/uL (ref 1.4–6.5)
Neutrophils Relative %: 56 %
PLATELETS: 429 10*3/uL (ref 150–440)
RBC: 6.02 MIL/uL — ABNORMAL HIGH (ref 4.40–5.90)
RDW: 14.8 % — AB (ref 11.5–14.5)
WBC: 9.2 10*3/uL (ref 3.8–10.6)

## 2018-03-13 LAB — URINALYSIS, COMPLETE (UACMP) WITH MICROSCOPIC
Bilirubin Urine: NEGATIVE
GLUCOSE, UA: NEGATIVE mg/dL
KETONES UR: NEGATIVE mg/dL
NITRITE: NEGATIVE
PH: 5 (ref 5.0–8.0)
Protein, ur: 30 mg/dL — AB
Specific Gravity, Urine: 1.011 (ref 1.005–1.030)

## 2018-03-13 LAB — COMPREHENSIVE METABOLIC PANEL
ALK PHOS: 63 U/L (ref 38–126)
ALT: 22 U/L (ref 17–63)
ANION GAP: 12 (ref 5–15)
AST: 32 U/L (ref 15–41)
Albumin: 3.8 g/dL (ref 3.5–5.0)
BUN: 12 mg/dL (ref 6–20)
CALCIUM: 8.8 mg/dL — AB (ref 8.9–10.3)
CO2: 16 mmol/L — AB (ref 22–32)
Chloride: 99 mmol/L — ABNORMAL LOW (ref 101–111)
Creatinine, Ser: 0.88 mg/dL (ref 0.61–1.24)
GFR calc non Af Amer: >60 mL/min (ref >60)
Glucose, Bld: 137 mg/dL — ABNORMAL HIGH (ref 65–99)
POTASSIUM: 3.8 mmol/L (ref 3.5–5.1)
SODIUM: 127 mmol/L — AB (ref 135–145)
Total Bilirubin: 0.4 mg/dL (ref 0.3–1.2)
Total Protein: 7.8 g/dL (ref 6.5–8.1)

## 2018-03-13 NOTE — ED Notes (Signed)
FN: pt brought over from [facility] for further eval of nephrostomy tubes leaking.

## 2018-03-13 NOTE — ED Provider Notes (Signed)
[facility] Emergency Department Provider Note  ____________________________________________   First MD Initiated Contact with Patient 03/13/18 1737     (approximate)  I have reviewed the triage vital signs and the nursing notes.   HISTORY  Chief Complaint nephrostomy tube leaking   HPI Benjamin [name] is a 67 y.o. male who self presents to the emergency department with 1 day of urine leaking around his left nephrostomy site.  He had a nephrostomy tube placed at [facility] roughly 1 month ago and has had no issues until today.  The nephrostomy was capped.  He is scheduled to have left kidney surgery after he is cleared by his cardiologist.  He denies fevers or chills.  He denies trauma.  He is urinating normally.  His wife had to change his dressing earlier today.  He denies back pain.  His symptoms are mild severity came on suddenly and are intermittent.  Nothing particular seems to make them better or worse.  Past Medical History:  Diagnosis Date  . Anxiety   . Arthritis   . Diabetes mellitus without complication ([place])   . Erythrocytosis 05/18/2015  . Erythrocytosis   . History of kidney stones   . 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 Polycythemia   . Sleep apnea    USES CPAP    Patient Active Problem List   Diagnosis Date Noted  . 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 Nephrolithiasis 12/14/2017  . Erythrocytosis 05/18/2015    Past Surgical History:  Procedure Laterality Date  . BLADDER STONE REMOVAL    . cataracts Bilateral   . COLONOSCOPY N/A 06/02/2015   Procedure: COLONOSCOPY;  Surgeon: [name], MD;  Location: [facility] ENDOSCOPY;  Service: Endoscopy;  Laterality: N/A;  . CYSTOSCOPY W/ RETROGRADES Left 11/01/2017   Procedure: CYSTOSCOPY WITH RETROGRADE PYELOGRAM;  Surgeon: [name], MD;  Location: [facility] ORS;  Service: Urology;   Laterality: Left;  . CYSTOSCOPY WITH STENT PLACEMENT Left 11/01/2017   Procedure: CYSTOSCOPY WITH STENT PLACEMENT;  Surgeon: [name], MD;  Location: [facility] ORS;  Service: Urology;  Laterality: Left;    Prior to Admission medications   Medication Sig Start Date End Date Taking? Authorizing Provider  canagliflozin (INVOKANA) 100 MG TABS tablet Take 100 mg by mouth daily.    [provider]  glipiZIDE (GLUCOTROL) 10 MG tablet Take 10 mg by mouth 2 (two) times daily before a meal.    [provider]  HYDROcodone-acetaminophen (NORCO/VICODIN) 5-325 MG tablet Take 1 tablet by mouth 2 (two) times daily as needed for moderate pain. Reported on 06/15/2016 10/24/17   Zara Council A, PA-C  lisinopril (PRINIVIL,ZESTRIL) 2.5 MG tablet Take 2.5 mg by mouth at bedtime.    [provider]  LORazepam (ATIVAN) 1 MG tablet Take 1 tablet (1 mg total) by mouth daily. Patient taking differently: Take 1 mg by mouth 2 (two) times daily.  10/20/15   Forest Gleason, MD  metFORMIN (GLUCOPHAGE-XR) 500 MG 24 hr tablet Take 1,000 mg by mouth 2 (two) times daily.    [provider]  mirabegron ER (MYRBETRIQ) 50 MG TB24 tablet Take 1 tablet (50 mg total) by mouth daily. 02/07/18   Stoioff, Ronda Fairly, MD  oxybutynin (DITROPAN) 5 MG tablet Take 1 tablet (5 mg total) by mouth every 8 (eight) hours as needed for bladder spasms. 02/17/18   Stoioff, Ronda Fairly, MD  pravastatin (PRAVACHOL)  10 MG tablet Take 10 mg by mouth at bedtime.     [provider]  terbinafine (LAMISIL) 250 MG tablet Take 250 mg by mouth at bedtime.    [provider]    Allergies Sertraline hcl  Family History  Problem Relation Age of Onset  . Heart disease Mother   . Diabetes Father     Social History Social History   Tobacco Use  . Smoking status: Never Smoker  . Smokeless tobacco: Never Used  Substance Use Topics  . Alcohol use: No  . Drug use: No    Review of Systems Constitutional:  No fever/chills Eyes: No visual changes. ENT: No sore throat. Cardiovascular: Denies chest pain. Respiratory: Denies shortness of breath. Gastrointestinal: No abdominal pain.  No nausea, no vomiting.  No diarrhea.  No constipation. Genitourinary: Negative for dysuria. Musculoskeletal: Negative for back pain. Skin: Negative for rash. Neurological: Negative for headaches, focal weakness or numbness.   ____________________________________________   PHYSICAL EXAM:  VITAL SIGNS: ED Triage Vitals  Enc Vitals Group     BP 03/13/18 1536 127/79     Pulse Rate 03/13/18 1536 99     Resp 03/13/18 1536 18     Temp 03/13/18 1536 98.6 F (37 C)     Temp Source 03/13/18 1536 Oral     SpO2 03/13/18 1536 98 %     Weight 03/13/18 1536 216 lb (98 kg)     Height 03/13/18 1536 5\' 8"  (1.727 m)     Head Circumference --      Peak Flow --      Pain Score 03/13/18 1552 3     Pain Loc --      Pain Edu? --      Excl. in [place]? --     Constitutional: Alert and oriented x4 somewhat anxious appearing nontoxic no diaphoresis speaks in full clear sentences Eyes: PERRL EOMI. Head: Atraumatic. Nose: No congestion/rhinnorhea. Mouth/Throat: No trismus Neck: No stridor.   Cardiovascular: Normal rate, regular rhythm. Grossly normal heart sounds.  Good peripheral circulation. Respiratory: Normal respiratory effort.  No retractions. Lungs CTAB and moving good air Gastrointestinal: Soft nondistended nontender no rebound or guarding no peritonitis percutaneous nephrostomy tube in place to left flank with no evidence of infection.  No erythema.  No purulence.  No urine at this time. Musculoskeletal: No lower extremity edema   Neurologic:  Normal speech and language. No gross focal neurologic deficits are appreciated. Skin:  Skin is warm, dry and intact. No rash noted. Psychiatric: Mildly anxious    ____________________________________________   DIFFERENTIAL includes but not limited to  Dislodged  nephrostomy tube, pyelonephritis, urinary tract infection, renal failure ____________________________________________   LABS (all labs ordered are listed, but only abnormal results are displayed)  Labs Reviewed  CBC WITH DIFFERENTIAL/PLATELET - Abnormal; Notable for the following components:      Result Value   RBC 6.02 (*)    MCV 76.6 (*)    MCH 25.1 (*)    RDW 14.8 (*)    All other components within normal limits  COMPREHENSIVE METABOLIC PANEL - Abnormal; Notable for the following components:   Sodium 127 (*)    Chloride 99 (*)    CO2 16 (*)    Glucose, Bld 137 (*)    Calcium 8.8 (*)    All other components within normal limits  URINALYSIS, COMPLETE (UACMP) WITH MICROSCOPIC - Abnormal; Notable for the following components:   Color, Urine YELLOW (*)    APPearance CLOUDY (*)  Hgb urine dipstick MODERATE (*)    Protein, ur 30 (*)    Leukocytes, UA LARGE (*)    Bacteria, UA RARE (*)    Squamous Epithelial / LPF 0-5 (*)    All other components within normal limits  URINE CULTURE    Lab work reviewed by me with slight hyponatremia otherwise unremarkable.  Urinalysis is consistent with infection __________________________________________  EKG   ____________________________________________  RADIOLOGY  KUB reviewed by me with tube in appropriate position ____________________________________________   PROCEDURES  Procedure(s) performed: no  Procedures  Critical Care performed: no  Observation: no ____________________________________________   INITIAL IMPRESSION / ASSESSMENT AND PLAN / ED COURSE  Pertinent labs & imaging results that were available during my care of the patient were reviewed by me and considered in my medical decision making (see chart for details).  The patient arrives hemodynamically stable and well-appearing and clearly not septic.  I discussed with Dr. Voncille Lo of [facility] urology on-call for the patient's urologist who indicated that  leaking around the nephrostomy site can happen secondary to increased bladder pressure.  He referred to this phenomenon as "yo-yoing".  He was comfortable having me discharge the patient home with follow-up tomorrow.  The patient himself is significantly reassured and verbalizes understanding and agreement with the plan.  He was diagnosed with urinary tract infection by his primary care physician yesterday and prescribed Cipro.  Urine culture sent here today.      ____________________________________________   FINAL CLINICAL IMPRESSION(S) / ED DIAGNOSES  Final diagnoses:  Nephrostomy complication ([place])      NEW MEDICATIONS STARTED DURING THIS VISIT:  Discharge Medication List as of 03/13/2018  7:43 PM       Note:  This document was prepared using Dragon voice recognition software and may include unintentional dictation errors.     Darel Hong, MD 03/13/18 2125

## 2018-03-13 NOTE — ED Triage Notes (Signed)
Pt c/o leaking from the entry site of his nephrostomy tube this afternoon. States it does not have a bag attached to it and has been clamped off for the past month. States there was some issue with the tube initially

## 2018-03-13 NOTE — ED Notes (Signed)
Pt up to toilet 

## 2018-03-13 NOTE — ED Notes (Signed)
Pt states his nephrostomy tube is leaking and denies any pain.

## 2018-03-13 NOTE — Discharge Instructions (Signed)
Today I discussed your situation with Dr. Voncille Lo at [facility] who feels your tube is "yo-yo'ing" and he recommends you call [facility] tomorrow to discuss further treatment.  Please return to the emergency department sooner for any new or worsening symptoms such as fevers, chills, pain, or for any other issues whatsoever.  It was a pleasure to take care of you today, and thank you for coming to our emergency department.  If you have any questions or concerns before leaving please ask the nurse to grab me and I'm more than happy to go through your aftercare instructions again.  If you were prescribed any opioid pain medication today such as Norco, Vicodin, Percocet, morphine, hydrocodone, or oxycodone please make sure you do not drive when you are taking this medication as it can alter your ability to drive safely.  If you have any concerns once you are home that you are not improving or are in fact getting worse before you can make it to your follow-up appointment, please do not hesitate to call 911 and come back for further evaluation.  Darel Hong, MD  Results for orders placed or performed during the hospital encounter of 03/13/18  CBC with Differential  Result Value Ref Range   WBC 9.2 3.8 - 10.6 K/uL   RBC 6.02 (H) 4.40 - 5.90 MIL/uL   Hemoglobin 15.1 13.0 - 18.0 g/dL   HCT 46.1 40.0 - 52.0 %   MCV 76.6 (L) 80.0 - 100.0 fL   MCH 25.1 (L) 26.0 - 34.0 pg   MCHC 32.8 32.0 - 36.0 g/dL   RDW 14.8 (H) 11.5 - 14.5 %   Platelets 429 150 - 440 K/uL   Neutrophils Relative % 56 %   Neutro Abs 5.2 1.4 - 6.5 K/uL   Lymphocytes Relative 33 %   Lymphs Abs 3.0 1.0 - 3.6 K/uL   Monocytes Relative 6 %   Monocytes Absolute 0.5 0.2 - 1.0 K/uL   Eosinophils Relative 4 %   Eosinophils Absolute 0.4 0 - 0.7 K/uL   Basophils Relative 1 %   Basophils Absolute 0.1 0 - 0.1 K/uL  Comprehensive metabolic panel  Result Value Ref Range   Sodium 127 (L) 135 - 145 mmol/L   Potassium 3.8 3.5 - 5.1 mmol/L   Chloride 99 (L) 101 - 111 mmol/L   CO2 16 (L) 22 - 32 mmol/L   Glucose, Bld 137 (H) 65 - 99 mg/dL   BUN 12 6 - 20 mg/dL   Creatinine, Ser 0.88 0.61 - 1.24 mg/dL   Calcium 8.8 (L) 8.9 - 10.3 mg/dL   Total Protein 7.8 6.5 - 8.1 g/dL   Albumin 3.8 3.5 - 5.0 g/dL   AST 32 15 - 41 U/L   ALT 22 17 - 63 U/L   Alkaline Phosphatase 63 38 - 126 U/L   Total Bilirubin 0.4 0.3 - 1.2 mg/dL   GFR calc non Af Amer >60 >60 mL/min   GFR calc Af Amer >60 >60 mL/min   Anion gap 12 5 - 15  Urinalysis, Complete w Microscopic  Result Value Ref Range   Color, Urine YELLOW (A) YELLOW   APPearance CLOUDY (A) CLEAR   Specific Gravity, Urine 1.011 1.005 - 1.030   pH 5.0 5.0 - 8.0   Glucose, UA NEGATIVE NEGATIVE mg/dL   Hgb urine dipstick MODERATE (A) NEGATIVE   Bilirubin Urine NEGATIVE NEGATIVE   Ketones, ur NEGATIVE NEGATIVE mg/dL   Protein, ur 30 (A) NEGATIVE mg/dL   Nitrite NEGATIVE  NEGATIVE   Leukocytes, UA LARGE (A) NEGATIVE   RBC / HPF TOO NUMEROUS TO COUNT 0 - 5 RBC/hpf   WBC, UA TOO NUMEROUS TO COUNT 0 - 5 WBC/hpf   Bacteria, UA RARE (A) NONE SEEN   Squamous Epithelial / LPF 0-5 (A) NONE SEEN   WBC Clumps PRESENT    Mucus PRESENT    Budding Yeast PRESENT    Dg Abdomen 1 View  Result Date: 03/13/2018 CLINICAL DATA:  67 year old male with non painful leakage from nephrostomy tube beginning today. EXAM: ABDOMEN - 1 VIEW COMPARISON:  CT Abdomen and Pelvis 10/18/2017. FINDINGS: A left-sided nephroureteral stent is demonstrated. The renal component of the catheter is mildly looped. The distal pigtail is appropriately positioned over the central lower pelvis within the urinary bladder. No catheter discontinuity is evident. There are 1 or more renal calcifications projecting lateral to the renal component of the catheter, individually measuring up to 10 millimeters diameter (arrow). No contralateral right side urologic calculus identified. No calculus identified along the course of the left ureter. Non  obstructed bowel gas pattern. No acute osseous abnormality identified. IMPRESSION: 1. Left-sided nephroureteral stent appears appropriately placed with no discontinuity identified. 2. Multiple calcifications just lateral to the renal component of the tube suspicious for persistent nephrolithiasis up to 10 mm individually. 3. No other urologic calculus identified. 4. Negative bowel gas pattern. Electronically Signed   By: Genevie Ann M.D.   On: 03/13/2018 16:47

## 2018-03-14 ENCOUNTER — Other Ambulatory Visit: Payer: Medicare HMO

## 2018-03-14 ENCOUNTER — Ambulatory Visit: Payer: Medicare HMO

## 2018-03-15 LAB — URINE CULTURE

## 2018-03-21 ENCOUNTER — Other Ambulatory Visit: Payer: Medicare HMO

## 2018-03-23 ENCOUNTER — Inpatient Hospital Stay: Payer: Medicare HMO | Attending: Oncology

## 2018-03-23 ENCOUNTER — Inpatient Hospital Stay: Payer: Medicare HMO

## 2018-03-23 DIAGNOSIS — D751 Secondary polycythemia: Secondary | ICD-10-CM | POA: Insufficient documentation

## 2018-03-23 LAB — CBC WITH DIFFERENTIAL/PLATELET
Basophils Absolute: 0.1 10*3/uL (ref 0–0.1)
Basophils Relative: 1 %
Eosinophils Absolute: 0.2 10*3/uL (ref 0–0.7)
Eosinophils Relative: 2 %
HCT: 44.2 % (ref 40.0–52.0)
Hemoglobin: 15.1 g/dL (ref 13.0–18.0)
Lymphocytes Relative: 30 %
Lymphs Abs: 2.6 10*3/uL (ref 1.0–3.6)
MCH: 26.4 pg (ref 26.0–34.0)
MCHC: 34.1 g/dL (ref 32.0–36.0)
MCV: 77.4 fL — ABNORMAL LOW (ref 80.0–100.0)
Monocytes Absolute: 0.6 10*3/uL (ref 0.2–1.0)
Monocytes Relative: 7 %
Neutro Abs: 5.1 10*3/uL (ref 1.4–6.5)
Neutrophils Relative %: 60 %
Platelets: 329 10*3/uL (ref 150–440)
RBC: 5.71 MIL/uL (ref 4.40–5.90)
RDW: 15.9 % — ABNORMAL HIGH (ref 11.5–14.5)
WBC: 8.6 10*3/uL (ref 3.8–10.6)

## 2018-03-23 LAB — FERRITIN: Ferritin: 32 ng/mL (ref 24–336)

## 2018-04-05 ENCOUNTER — Ambulatory Visit (INDEPENDENT_AMBULATORY_CARE_PROVIDER_SITE_OTHER): Payer: Medicare HMO

## 2018-04-05 VITALS — BP 125/85 | HR 103 | Ht 68.0 in | Wt 215.0 lb

## 2018-04-05 DIAGNOSIS — R350 Frequency of micturition: Secondary | ICD-10-CM | POA: Diagnosis not present

## 2018-04-05 NOTE — Addendum Note (Signed)
Addended by: Toniann Fail C on: 04/05/2018 02:07 PM   Modules accepted: Orders

## 2018-04-05 NOTE — Progress Notes (Addendum)
Per Dr. Bernardo Heater pt should call [facility] with s/s as he has been under their care.   Blood pressure 125/85, pulse (!) 103, height 5\' 8"  (1.727 m), weight 215 lb (97.5 kg).

## 2018-05-09 ENCOUNTER — Inpatient Hospital Stay: Payer: Medicare HMO

## 2018-05-11 ENCOUNTER — Other Ambulatory Visit: Payer: Medicare HMO

## 2018-05-16 ENCOUNTER — Inpatient Hospital Stay: Payer: Medicare HMO | Attending: Oncology

## 2018-05-16 ENCOUNTER — Inpatient Hospital Stay: Payer: Medicare HMO

## 2018-05-16 DIAGNOSIS — D751 Secondary polycythemia: Secondary | ICD-10-CM | POA: Diagnosis not present

## 2018-05-16 LAB — CBC WITH DIFFERENTIAL/PLATELET
Basophils Absolute: 0 10*3/uL (ref 0–0.1)
Basophils Relative: 1 %
Eosinophils Absolute: 1.2 10*3/uL — ABNORMAL HIGH (ref 0–0.7)
Eosinophils Relative: 14 %
HCT: 43.9 % (ref 40.0–52.0)
Hemoglobin: 14.9 g/dL (ref 13.0–18.0)
Lymphocytes Relative: 31 %
Lymphs Abs: 2.7 10*3/uL (ref 1.0–3.6)
MCH: 27 pg (ref 26.0–34.0)
MCHC: 33.9 g/dL (ref 32.0–36.0)
MCV: 79.4 fL — ABNORMAL LOW (ref 80.0–100.0)
Monocytes Absolute: 0.8 10*3/uL (ref 0.2–1.0)
Monocytes Relative: 9 %
Neutro Abs: 4.1 10*3/uL (ref 1.4–6.5)
Neutrophils Relative %: 45 %
Platelets: 257 10*3/uL (ref 150–440)
RBC: 5.53 MIL/uL (ref 4.40–5.90)
RDW: 17 % — ABNORMAL HIGH (ref 11.5–14.5)
WBC: 8.8 10*3/uL (ref 3.8–10.6)

## 2018-05-16 LAB — FERRITIN: Ferritin: 25 ng/mL (ref 24–336)

## 2018-06-11 IMAGING — NM NM RENAL IMAGING FLOW W/ PHARM
3 series · 18 of 18 positions shown · non-contrast
Comparison: CT abdomen and pelvis 10/18/2017

CLINICAL DATA: LEFT hydronephrosis due to UPJ obstruction

EXAM:
NUCLEAR MEDICINE RENAL SCAN WITH DIURETIC ADMINISTRATION
TECHNIQUE: Radionuclide angiographic and sequential renal images were obtained
after intravenous injection of radiopharmaceutical. Imaging was
continued during slow intravenous injection of Lasix approximately
15 minutes after the start of the examination.
RADIOPHARMACEUTICALS:  5.22 mCi Qechnetium-99m MAG3 IV
Pharmaceutical: Lasix 20 mg IV at mid point of imaging

[Series 1000: lasix renal mag 3 · 7.79mm/px · 6 of 114 frames shown]
[frame 10/114]
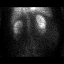
[frame 29/114]
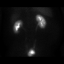
[frame 48/114]
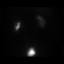
[frame 67/114]
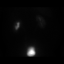
[frame 86/114]
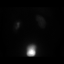
[frame 105/114]
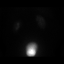

[Series 1000: lasix renal mag 3 (first dynamic renal results) · 7.79mm/px · 6 of 114 frames shown]
[frame 10/114]
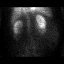
[frame 29/114]
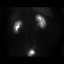
[frame 48/114]
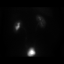
[frame 67/114]
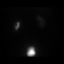
[frame 86/114]
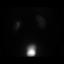
[frame 105/114]
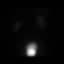

[Series 1000: lasix renal mag 3 (results) · 7.79mm/px · 6 of 114 frames shown]
[frame 10/114]
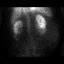
[frame 29/114]
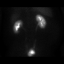
[frame 48/114]
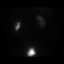
[frame 67/114]
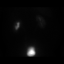
[frame 86/114]
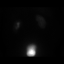
[frame 105/114]
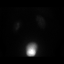

[18 of 18 positions shown; findings below may reference images not displayed]

FINDINGS: Flow: Prompt symmetric arterial flow to the RIGHT kidney. Slightly
delayed and diminished flow to LEFT kidney.

Left renogram: LEFT kidney appears slightly small versus RIGHT
kidney. Normal uptake and concentration of tracer. Excretion of
tracer into a dilated LEFT renal collecting system. Initially slow
washout of tracer from the collecting system which rapidly
accelerates following diuretic administration. Peak activity at
approximately 6 minutes. No significant retention of tracer at the
conclusion of the exam.

Right renogram: Normal uptake, concentration, and excretion of
tracer by RIGHT kidney. Normal time to peak activity of 2.2 minutes
with normal fall to half maximum activity prior to Lasix
administration. No significant residual tracer retention within
RIGHT kidney at conclusion of the exam.

Differential:

Left kidney = 33 %

Right kidney = 67 %

T1/2 post Lasix :

Left kidney = 7.5 min

Right kidney = 6.5 min ** pre Lasix
IMPRESSION: Normal RIGHT renogram.

Dilated LEFT renal collecting system.

Good washout of tracer from LEFT kidney following diuretic
administration indicating a lack of significant urinary outflow
obstruction.

## 2018-07-13 ENCOUNTER — Ambulatory Visit: Payer: Medicare HMO | Admitting: Oncology

## 2018-07-13 ENCOUNTER — Other Ambulatory Visit: Payer: Medicare HMO

## 2018-07-16 ENCOUNTER — Other Ambulatory Visit: Payer: Self-pay | Admitting: *Deleted

## 2018-07-16 DIAGNOSIS — D751 Secondary polycythemia: Secondary | ICD-10-CM

## 2018-07-18 ENCOUNTER — Inpatient Hospital Stay: Payer: Medicare HMO

## 2018-07-18 ENCOUNTER — Inpatient Hospital Stay: Payer: Medicare HMO | Attending: Oncology

## 2018-07-18 ENCOUNTER — Encounter: Payer: Self-pay | Admitting: Oncology

## 2018-07-18 ENCOUNTER — Inpatient Hospital Stay (HOSPITAL_BASED_OUTPATIENT_CLINIC_OR_DEPARTMENT_OTHER): Payer: Medicare HMO | Admitting: Oncology

## 2018-07-18 ENCOUNTER — Other Ambulatory Visit: Payer: Self-pay

## 2018-07-18 VITALS — BP 124/75 | HR 98 | Temp 98.0°F | Resp 18 | Ht 68.0 in | Wt 232.0 lb

## 2018-07-18 VITALS — BP 118/77 | HR 88 | Resp 20

## 2018-07-18 DIAGNOSIS — Z7984 Long term (current) use of oral hypoglycemic drugs: Secondary | ICD-10-CM | POA: Insufficient documentation

## 2018-07-18 DIAGNOSIS — F419 Anxiety disorder, unspecified: Secondary | ICD-10-CM | POA: Diagnosis not present

## 2018-07-18 DIAGNOSIS — Z87442 Personal history of urinary calculi: Secondary | ICD-10-CM | POA: Diagnosis not present

## 2018-07-18 DIAGNOSIS — D751 Secondary polycythemia: Secondary | ICD-10-CM

## 2018-07-18 DIAGNOSIS — R5383 Other fatigue: Secondary | ICD-10-CM

## 2018-07-18 DIAGNOSIS — G4730 Sleep apnea, unspecified: Secondary | ICD-10-CM

## 2018-07-18 DIAGNOSIS — Z79899 Other long term (current) drug therapy: Secondary | ICD-10-CM

## 2018-07-18 DIAGNOSIS — E119 Type 2 diabetes mellitus without complications: Secondary | ICD-10-CM

## 2018-07-18 LAB — COMPREHENSIVE METABOLIC PANEL
ALK PHOS: 57 U/L (ref 38–126)
ALT: 15 U/L (ref 0–44)
AST: 19 U/L (ref 15–41)
Albumin: 3.9 g/dL (ref 3.5–5.0)
Anion gap: 10 (ref 5–15)
BUN: 16 mg/dL (ref 8–23)
CALCIUM: 9.6 mg/dL (ref 8.9–10.3)
CO2: 24 mmol/L (ref 22–32)
CREATININE: 1.01 mg/dL (ref 0.61–1.24)
Chloride: 102 mmol/L (ref 98–111)
GFR calc non Af Amer: >60 mL/min (ref >60)
Glucose, Bld: 211 mg/dL — ABNORMAL HIGH (ref 70–99)
Potassium: 4.2 mmol/L (ref 3.5–5.1)
SODIUM: 136 mmol/L (ref 135–145)
TOTAL PROTEIN: 7.2 g/dL (ref 6.5–8.1)
Total Bilirubin: 0.6 mg/dL (ref 0.3–1.2)

## 2018-07-18 LAB — CBC WITH DIFFERENTIAL/PLATELET
BASOS PCT: 0 %
Basophils Absolute: 0 10*3/uL (ref 0–0.1)
EOS ABS: 1.5 10*3/uL — AB (ref 0–0.7)
Eosinophils Relative: 14 %
HCT: 47.3 % (ref 40.0–52.0)
HEMOGLOBIN: 16.2 g/dL (ref 13.0–18.0)
LYMPHS ABS: 3 10*3/uL (ref 1.0–3.6)
Lymphocytes Relative: 28 %
MCH: 28.1 pg (ref 26.0–34.0)
MCHC: 34.2 g/dL (ref 32.0–36.0)
MCV: 82.3 fL (ref 80.0–100.0)
Monocytes Absolute: 0.7 10*3/uL (ref 0.2–1.0)
Monocytes Relative: 7 %
NEUTROS PCT: 51 %
Neutro Abs: 5.5 10*3/uL (ref 1.4–6.5)
PLATELETS: 284 10*3/uL (ref 150–440)
RBC: 5.75 MIL/uL (ref 4.40–5.90)
RDW: 14.3 % (ref 11.5–14.5)
WBC: 10.8 10*3/uL — AB (ref 3.8–10.6)

## 2018-07-18 LAB — FERRITIN: Ferritin: 33 ng/mL (ref 24–336)

## 2018-07-18 NOTE — Progress Notes (Signed)
No new changes noted today 

## 2018-07-20 NOTE — Progress Notes (Signed)
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  BCR able and Jak 2 mutation testing was negative in 2011.  He does have a history of obstructive sleep apnea and uses CPAP.  He is [name] patient.  He was initially getting phlebotomy every 2 weeks but now gets it every 2 months if need be.  His threshold for phlebotomy is a hematocrit of greater than 47.  Patient does report improvement in his fatigue for a few days after he gets a phlebotomy  Interval history-reports mild chronic fatigue.  He is compliant with his CPAP.Denies any chest pain focal numbness or weakness or strokelike symptoms.  ECOG PS- 1 Pain scale- 0 Opioid associated constipation- no  Review of systems- Review of Systems  Constitutional: Positive for malaise/fatigue. Negative for chills, fever and weight loss.  HENT: Negative for congestion, ear discharge and nosebleeds.   Eyes: Negative for blurred vision.  Respiratory: Negative for cough, hemoptysis, sputum production, shortness of breath and wheezing.   Cardiovascular: Negative for chest pain, palpitations, orthopnea and claudication.  Gastrointestinal: Negative for abdominal pain, blood in stool, constipation, diarrhea, heartburn, melena, nausea and vomiting.  Genitourinary: Negative for dysuria, flank pain, frequency, hematuria and urgency.  Musculoskeletal: Negative for back pain, joint pain and myalgias.  Skin: Negative for rash.  Neurological: Negative for dizziness, tingling, focal weakness, seizures,  weakness and headaches.  Endo/Heme/Allergies: Does not bruise/bleed easily.  Psychiatric/Behavioral: Negative for depression and suicidal ideas. The patient does not have insomnia.        Allergies  Allergen Reactions  . Sertraline Hcl Other (See Comments)    Vivid dreams     Past Medical History:  Diagnosis Date  . Anxiety   . Arthritis   . Diabetes mellitus without complication ([place])   . Erythrocytosis 05/18/2015  . Erythrocytosis   . History of kidney stones   . 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 Polycythemia   . Sleep apnea    USES CPAP     Past Surgical History:  Procedure Laterality Date  . BLADDER STONE REMOVAL    . cataracts Bilateral   . COLONOSCOPY N/A 06/02/2015   Procedure: COLONOSCOPY;  Surgeon: [name], MD;  Location: [facility] ENDOSCOPY;  Service: Endoscopy;  Laterality: N/A;  . CYSTOSCOPY W/ RETROGRADES Left 11/01/2017   Procedure: CYSTOSCOPY WITH RETROGRADE PYELOGRAM;  Surgeon: [name], MD;  Location: [facility] ORS;  Service: Urology;  Laterality: Left;  . CYSTOSCOPY WITH STENT PLACEMENT Left 11/01/2017   Procedure: CYSTOSCOPY WITH STENT PLACEMENT;  Surgeon: [name], MD;  Location: [facility] ORS;  Service: Urology;  Laterality: Left;    Social History   Socioeconomic History  . Marital status: Married    Spouse name: Not on file  . Number of children: Not on file  . Years of education: Not on file  . Highest education level: Not on file  Occupational History  . Not on file  Social Needs  . Financial resource strain: Not on file  . Food  insecurity:    Worry: Not on file    Inability: Not on file  . Transportation needs:    Medical: Not on file    Non-medical: Not on file  Tobacco Use  . Smoking status: Never Smoker  . Smokeless tobacco: Never Used  Substance and Sexual Activity  . Alcohol use: No  . Drug use: No  . Sexual activity: Not on file    Lifestyle  . Physical activity:    Days per week: Not on file    Minutes per session: Not on file  . Stress: Not on file  Relationships  . Social connections:    Talks on phone: Not on file    Gets together: Not on file    Attends religious service: Not on file    Active member of club or organization: Not on file    Attends meetings of clubs or organizations: Not on file    Relationship status: Not on file  . Intimate partner violence:    Fear of current or ex partner: Not on file    Emotionally abused: Not on file    Physically abused: Not on file    Forced sexual activity: Not on file  Other Topics Concern  . Not on file  Social History Narrative  . Not on file    Family History  Problem Relation Age of Onset  . Heart disease Mother   . Diabetes Father      Current Outpatient Medications:  .  canagliflozin (INVOKANA) 100 MG TABS tablet, Take 100 mg by mouth daily., Disp: , Rfl:  .  glipiZIDE (GLUCOTROL) 10 MG tablet, Take 10 mg by mouth 2 (two) times daily before a meal., Disp: , Rfl:  .  HYDROcodone-acetaminophen (NORCO/VICODIN) 5-325 MG tablet, Take 1 tablet by mouth 2 (two) times daily as needed for moderate pain. Reported on 06/15/2016, Disp: 30 tablet, Rfl: 0 .  lisinopril (PRINIVIL,ZESTRIL) 2.5 MG tablet, Take 2.5 mg by mouth at bedtime., Disp: , Rfl:  .  metFORMIN (GLUCOPHAGE-XR) 500 MG 24 hr tablet, Take 1,000 mg by mouth 2 (two) times daily., Disp: , Rfl:  .  mirabegron ER (MYRBETRIQ) 50 MG TB24 tablet, Take 1 tablet (50 mg total) by mouth daily., Disp: 30 tablet, Rfl: 3 .  oxybutynin (DITROPAN) 5 MG tablet, Take 1 tablet (5 mg total) by mouth every 8 (eight) hours as needed for bladder spasms., Disp: 30 tablet, Rfl: 1 .  pravastatin (PRAVACHOL) 10 MG tablet, Take 10 mg by mouth at bedtime. , Disp: , Rfl:  .  terbinafine (LAMISIL) 250 MG tablet, Take 250 mg by mouth at bedtime., Disp: , Rfl:  .  LORazepam (ATIVAN) 1 MG tablet, Take 1 tablet (1 mg total) by mouth  daily. (Patient not taking: Reported on 07/18/2018), Disp: 15 tablet, Rfl: 0  Current Facility-Administered Medications:  .  ciprofloxacin (CIPRO) tablet 500 mg, 500 mg, Oral, Once, Stoioff, Scott C, MD .  lidocaine (XYLOCAINE) 2 % jelly 1 application, 1 application, Urethral, Once, Stoioff, Scott C, MD  Physical exam:  Vitals:   07/18/18 1435  BP: 124/75  Pulse: 98  Resp: 18  Temp: 98 F (36.7 C)  TempSrc: Tympanic  SpO2: 98%  Weight: 232 lb (105.2 kg)  Height: 5\' 8"  (1.727 m)   Physical Exam  Constitutional: He is oriented to person, place, and time.  He is obese.  Does not appear to be in acute distress  HENT:  Head: Normocephalic and atraumatic.  Eyes: Pupils are  equal, round, and reactive to light. EOM are normal.  Neck: Normal range of motion.  Cardiovascular: Normal rate, regular rhythm and normal heart sounds.  Pulmonary/Chest: Effort normal and breath sounds normal.  Abdominal: Soft. Bowel sounds are normal.  Neurological: He is alert and oriented to person, place, and time.  Skin: Skin is warm and dry.     CMP Latest Ref Rng & Units 07/18/2018  Glucose 70 - 99 mg/dL 211(H)  BUN 8 - 23 mg/dL 16  Creatinine 0.61 - 1.24 mg/dL 1.01  Sodium 135 - 145 mmol/L 136  Potassium 3.5 - 5.1 mmol/L 4.2  Chloride 98 - 111 mmol/L 102  CO2 22 - 32 mmol/L 24  Calcium 8.9 - 10.3 mg/dL 9.6  Total Protein 6.5 - 8.1 g/dL 7.2  Total Bilirubin 0.3 - 1.2 mg/dL 0.6  Alkaline Phos 38 - 126 U/L 57  AST 15 - 41 U/L 19  ALT 0 - 44 U/L 15   CBC Latest Ref Rng & Units 07/18/2018  WBC 3.8 - 10.6 K/uL 10.8(H)  Hemoglobin 13.0 - 18.0 g/dL 16.2  Hematocrit 40.0 - 52.0 % 47.3  Platelets 150 - 440 K/uL 284     Assessment and plan- Patient is a 68 y.o. male with history of secondary polycythemia likely secondary to obstructive sleep apnea   Patient's last phlebotomy was about 6 months ago.  He did not require phlebotomy 2 and 4 months ago as his hematocrit was less than 47.  Today's  hematocrit is 47.3 and he will proceed with phlebotomy at this time.  Repeat CBC with differential in 2 months in 4 months and he will return to clinic in 6 months to see [name] with CBC CMP and ferritin for possible phlebotomy     Visit Diagnosis 1. Erythrocytosis      Dr. Randa Evens, MD, MPH [facility] [phone] 07/20/2018 9:52 AM

## 2018-09-18 ENCOUNTER — Inpatient Hospital Stay: Payer: Medicare HMO | Attending: Hematology and Oncology

## 2018-09-18 ENCOUNTER — Inpatient Hospital Stay: Payer: Medicare HMO

## 2018-09-18 DIAGNOSIS — D751 Secondary polycythemia: Secondary | ICD-10-CM | POA: Diagnosis not present

## 2018-09-18 LAB — CBC
HCT: 47 % (ref 40.0–52.0)
Hemoglobin: 16 g/dL (ref 13.0–18.0)
MCH: 28 pg (ref 26.0–34.0)
MCHC: 34.1 g/dL (ref 32.0–36.0)
MCV: 82.3 fL (ref 80.0–100.0)
PLATELETS: 259 10*3/uL (ref 150–440)
RBC: 5.71 MIL/uL (ref 4.40–5.90)
RDW: 13.8 % (ref 11.5–14.5)
WBC: 8.7 10*3/uL (ref 3.8–10.6)

## 2018-09-18 NOTE — Progress Notes (Signed)
No phlebotomy needed today per lab parameters, pt informed and given copy of today's lab results.

## 2018-09-25 DIAGNOSIS — E785 Hyperlipidemia, unspecified: Secondary | ICD-10-CM | POA: Insufficient documentation

## 2018-09-25 DIAGNOSIS — I10 Essential (primary) hypertension: Secondary | ICD-10-CM | POA: Insufficient documentation

## 2018-11-15 ENCOUNTER — Inpatient Hospital Stay: Payer: Medicare HMO

## 2018-11-15 ENCOUNTER — Inpatient Hospital Stay: Payer: Medicare HMO | Attending: Hematology and Oncology

## 2018-11-15 VITALS — BP 119/78 | HR 99 | Temp 97.7°F | Resp 20

## 2018-11-15 DIAGNOSIS — D751 Secondary polycythemia: Secondary | ICD-10-CM | POA: Insufficient documentation

## 2018-11-15 LAB — CBC
HCT: 50.9 % (ref 39.0–52.0)
Hemoglobin: 17.1 g/dL — ABNORMAL HIGH (ref 13.0–17.0)
MCH: 27.1 pg (ref 26.0–34.0)
MCHC: 33.6 g/dL (ref 30.0–36.0)
MCV: 80.5 fL (ref 80.0–100.0)
PLATELETS: 266 10*3/uL (ref 150–400)
RBC: 6.32 MIL/uL — ABNORMAL HIGH (ref 4.22–5.81)
RDW: 14.4 % (ref 11.5–15.5)
WBC: 11.2 10*3/uL — ABNORMAL HIGH (ref 4.0–10.5)
nRBC: 0 % (ref 0.0–0.2)

## 2018-11-15 LAB — FERRITIN: Ferritin: 42 ng/mL (ref 24–336)

## 2019-01-15 ENCOUNTER — Inpatient Hospital Stay: Payer: Medicare HMO | Attending: Oncology | Admitting: Oncology

## 2019-01-15 ENCOUNTER — Other Ambulatory Visit: Payer: Self-pay

## 2019-01-15 ENCOUNTER — Inpatient Hospital Stay: Payer: Medicare HMO

## 2019-01-15 ENCOUNTER — Encounter: Payer: Self-pay | Admitting: Oncology

## 2019-01-15 VITALS — BP 128/82 | HR 94 | Resp 20

## 2019-01-15 VITALS — BP 121/70 | HR 102 | Temp 96.3°F | Resp 18 | Wt 247.9 lb

## 2019-01-15 DIAGNOSIS — G4730 Sleep apnea, unspecified: Secondary | ICD-10-CM | POA: Insufficient documentation

## 2019-01-15 DIAGNOSIS — G4733 Obstructive sleep apnea (adult) (pediatric): Secondary | ICD-10-CM | POA: Diagnosis not present

## 2019-01-15 DIAGNOSIS — M129 Arthropathy, unspecified: Secondary | ICD-10-CM | POA: Insufficient documentation

## 2019-01-15 DIAGNOSIS — Z79899 Other long term (current) drug therapy: Secondary | ICD-10-CM | POA: Diagnosis not present

## 2019-01-15 DIAGNOSIS — Z87442 Personal history of urinary calculi: Secondary | ICD-10-CM | POA: Diagnosis not present

## 2019-01-15 DIAGNOSIS — D751 Secondary polycythemia: Secondary | ICD-10-CM

## 2019-01-15 DIAGNOSIS — F419 Anxiety disorder, unspecified: Secondary | ICD-10-CM | POA: Insufficient documentation

## 2019-01-15 DIAGNOSIS — E119 Type 2 diabetes mellitus without complications: Secondary | ICD-10-CM | POA: Insufficient documentation

## 2019-01-15 DIAGNOSIS — Z7984 Long term (current) use of oral hypoglycemic drugs: Secondary | ICD-10-CM | POA: Insufficient documentation

## 2019-01-15 DIAGNOSIS — D508 Other iron deficiency anemias: Secondary | ICD-10-CM

## 2019-01-15 DIAGNOSIS — D500 Iron deficiency anemia secondary to blood loss (chronic): Secondary | ICD-10-CM

## 2019-01-15 LAB — COMPREHENSIVE METABOLIC PANEL
ALBUMIN: 4.1 g/dL (ref 3.5–5.0)
ALK PHOS: 58 U/L (ref 38–126)
ALT: 24 U/L (ref 0–44)
AST: 21 U/L (ref 15–41)
Anion gap: 9 (ref 5–15)
BUN: 16 mg/dL (ref 8–23)
CALCIUM: 9.2 mg/dL (ref 8.9–10.3)
CO2: 25 mmol/L (ref 22–32)
CREATININE: 0.96 mg/dL (ref 0.61–1.24)
Chloride: 101 mmol/L (ref 98–111)
GFR calc Af Amer: >60 mL/min (ref >60)
GFR calc non Af Amer: >60 mL/min (ref >60)
GLUCOSE: 228 mg/dL — AB (ref 70–99)
Potassium: 4.2 mmol/L (ref 3.5–5.1)
SODIUM: 135 mmol/L (ref 135–145)
TOTAL PROTEIN: 7.5 g/dL (ref 6.5–8.1)
Total Bilirubin: 0.7 mg/dL (ref 0.3–1.2)

## 2019-01-15 LAB — CBC
HCT: 49.6 % (ref 39.0–52.0)
HEMOGLOBIN: 16.6 g/dL (ref 13.0–17.0)
MCH: 27.3 pg (ref 26.0–34.0)
MCHC: 33.5 g/dL (ref 30.0–36.0)
MCV: 81.4 fL (ref 80.0–100.0)
NRBC: 0 % (ref 0.0–0.2)
Platelets: 260 10*3/uL (ref 150–400)
RBC: 6.09 MIL/uL — AB (ref 4.22–5.81)
RDW: 13.3 % (ref 11.5–15.5)
WBC: 9.3 10*3/uL (ref 4.0–10.5)

## 2019-01-15 LAB — FERRITIN: FERRITIN: 19 ng/mL — AB (ref 24–336)

## 2019-01-15 NOTE — Progress Notes (Signed)
Patient here for follow up. No concerns voiced. Pt transferring care from [name] to Dr. Tasia Catchings.

## 2019-01-15 NOTE — Progress Notes (Signed)
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  BCR able and Jak 2 mutation testing was negative in 2011.  He does have a history of obstructive sleep apnea and uses CPAP.  He is [name] patient.  He was initially getting phlebotomy every 2 weeks but now gets it every 2 months if need be.  His threshold for phlebotomy is a hematocrit of greater than 47.  Patient does report improvement in his fatigue for a few days after he gets a phlebotomy  Interval history-patient previously follows up with [name]. He switches care to me on 01/15/2019.  #Reports very mild chronic fatigue.  At baseline. He is compliant with CPAP machine for OSA. Denies any unintentional weight loss, shortness of breath, chest pain, abdominal pain, hematuria. Reviewed previous work-up. 06/15/2016 JAK2 V617F negative, Exons 12-15 mutation negative.  06/15/2017 Erythropoietin 12.8.     ECOG PS- 1 Pain scale- 0 Opioid associated constipation- no  Review of systems- Review of Systems  Constitutional: Positive for malaise/fatigue. Negative for chills, fever and weight loss.  HENT: Negative for congestion, ear discharge and nosebleeds.   Eyes: Negative for blurred vision.  Respiratory: Negative for cough, hemoptysis, sputum production, shortness of breath and wheezing.   Cardiovascular: Negative for chest pain, palpitations, orthopnea and claudication.  Gastrointestinal: Negative for abdominal pain, blood in stool, constipation, diarrhea, heartburn, melena,  nausea and vomiting.  Genitourinary: Negative for dysuria, flank pain, frequency, hematuria and urgency.  Musculoskeletal: Negative for back pain, joint pain and myalgias.  Skin: Negative for rash.  Neurological: Negative for dizziness, tingling, focal weakness, seizures, weakness and headaches.  Endo/Heme/Allergies: Does not bruise/bleed easily.  Psychiatric/Behavioral: Negative for depression and suicidal ideas. The patient does not have insomnia.        Allergies  Allergen Reactions  . Sertraline Hcl Other (See Comments)    Vivid dreams     Past Medical History:  Diagnosis Date  . Anxiety   . Arthritis   . Diabetes mellitus without complication ([place])   . Erythrocytosis 05/18/2015  . Erythrocytosis   . History of kidney stones   . 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 Polycythemia   . Sleep apnea    USES CPAP     Past Surgical History:  Procedure Laterality Date  . BLADDER STONE REMOVAL    . cataracts Bilateral   . COLONOSCOPY N/A 06/02/2015   Procedure: COLONOSCOPY;  Surgeon: [name], MD;  Location: [facility] ENDOSCOPY;  Service: Endoscopy;  Laterality: N/A;  . CYSTOSCOPY W/ RETROGRADES Left 11/01/2017   Procedure: CYSTOSCOPY WITH RETROGRADE PYELOGRAM;  Surgeon: [name], MD;  Location: [facility] ORS;  Service: Urology;  Laterality: Left;  . CYSTOSCOPY WITH STENT PLACEMENT Left 11/01/2017   Procedure: CYSTOSCOPY WITH STENT PLACEMENT;  Surgeon: [name], MD;  Location: [facility] ORS;  Service: Urology;  Laterality: Left;    Social History   Socioeconomic History  . Marital status: Married    Spouse name: Not on file  . Number  of children: Not on file  . Years of education: Not on file  . Highest education level: Not on file  Occupational History  . Not on file  Social Needs  . Financial resource strain: Not on file  . Food insecurity:    Worry: Not on file    Inability:  Not on file  . Transportation needs:    Medical: Not on file    Non-medical: Not on file  Tobacco Use  . Smoking status: Never Smoker  . Smokeless tobacco: Never Used  Substance and Sexual Activity  . Alcohol use: No  . Drug use: No  . Sexual activity: Not on file  Lifestyle  . Physical activity:    Days per week: Not on file    Minutes per session: Not on file  . Stress: Not on file  Relationships  . Social connections:    Talks on phone: Not on file    Gets together: Not on file    Attends religious service: Not on file    Active member of club or organization: Not on file    Attends meetings of clubs or organizations: Not on file    Relationship status: Not on file  . Intimate partner violence:    Fear of current or ex partner: Not on file    Emotionally abused: Not on file    Physically abused: Not on file    Forced sexual activity: Not on file  Other Topics Concern  . Not on file  Social History Narrative  . Not on file    Family History  Problem Relation Age of Onset  . Heart disease Mother   . Diabetes Father      Current Outpatient Medications:  .  glipiZIDE (GLUCOTROL) 10 MG tablet, Take 10 mg by mouth 2 (two) times daily before a meal., Disp: , Rfl:  .  HYDROcodone-acetaminophen (NORCO/VICODIN) 5-325 MG tablet, Take 1 tablet by mouth 2 (two) times daily as needed for moderate pain. Reported on 06/15/2016, Disp: 30 tablet, Rfl: 0 .  lisinopril (PRINIVIL,ZESTRIL) 2.5 MG tablet, Take 2.5 mg by mouth at bedtime., Disp: , Rfl:  .  metFORMIN (GLUCOPHAGE-XR) 500 MG 24 hr tablet, Take 1,000 mg by mouth 2 (two) times daily., Disp: , Rfl:  .  pioglitazone (ACTOS) 15 MG tablet, TK 1 T PO ONCE D, Disp: , Rfl:  .  pravastatin (PRAVACHOL) 10 MG tablet, Take 10 mg by mouth at bedtime. , Disp: , Rfl:  .  terbinafine (LAMISIL) 250 MG tablet, Take 250 mg by mouth at bedtime., Disp: , Rfl:  .  LORazepam (ATIVAN) 1 MG tablet, Take 1 tablet (1 mg total) by mouth daily. (Patient  not taking: Reported on 07/18/2018), Disp: 15 tablet, Rfl: 0  Current Facility-Administered Medications:  .  ciprofloxacin (CIPRO) tablet 500 mg, 500 mg, Oral, Once, Stoioff, Scott C, MD .  lidocaine (XYLOCAINE) 2 % jelly 1 application, 1 application, Urethral, Once, Stoioff, Scott C, MD  Physical exam:  Vitals:   01/15/19 1346  BP: 121/70  Pulse: (!) 102  Resp: 18  Temp: (!) 96.3 F (35.7 C)  TempSrc: Tympanic  Weight: 247 lb 14.4 oz (112.4 kg)   Physical Exam Constitutional:      General: He is not in acute distress.    Comments: He is obese.  Does not appear to be in acute distress  HENT:     Head: Normocephalic and atraumatic.  Eyes:     General: No scleral icterus.  Pupils: Pupils are equal, round, and reactive to light.  Neck:     Musculoskeletal: Normal range of motion and neck supple.  Cardiovascular:     Rate and Rhythm: Normal rate and regular rhythm.     Heart sounds: Normal heart sounds.  Pulmonary:     Effort: Pulmonary effort is normal. No respiratory distress.     Breath sounds: Normal breath sounds. No wheezing.  Abdominal:     General: Bowel sounds are normal. There is no distension.     Palpations: Abdomen is soft. There is no mass.     Tenderness: There is no abdominal tenderness.  Musculoskeletal: Normal range of motion.        General: No deformity.  Skin:    General: Skin is warm and dry.     Findings: No erythema or rash.  Neurological:     Mental Status: He is alert and oriented to person, place, and time.     Cranial Nerves: No cranial nerve deficit.     Coordination: Coordination normal.  Psychiatric:        Behavior: Behavior normal.        Thought Content: Thought content normal.      CMP Latest Ref Rng & Units 01/15/2019  Glucose 70 - 99 mg/dL 228(H)  BUN 8 - 23 mg/dL 16  Creatinine 0.61 - 1.24 mg/dL 0.96  Sodium 135 - 145 mmol/L 135  Potassium 3.5 - 5.1 mmol/L 4.2  Chloride 98 - 111 mmol/L 101  CO2 22 - 32 mmol/L 25  Calcium  8.9 - 10.3 mg/dL 9.2  Total Protein 6.5 - 8.1 g/dL 7.5  Total Bilirubin 0.3 - 1.2 mg/dL 0.7  Alkaline Phos 38 - 126 U/L 58  AST 15 - 41 U/L 21  ALT 0 - 44 U/L 24   CBC Latest Ref Rng & Units 01/15/2019  WBC 4.0 - 10.5 K/uL 9.3  Hemoglobin 13.0 - 17.0 g/dL 16.6  Hematocrit 39.0 - 52.0 % 49.6  Platelets 150 - 400 K/uL 260     Assessment and plan- Patient is a 68 y.o. male with history of secondary polycythemia likely secondary to obstructive sleep apnea   1. Erythrocytosis   2. OSA (obstructive sleep apnea)   3. Iron deficiency anemia due to chronic blood loss     #Secondary polycythemia patient's last phlebotomy was about 17-month ago. Today's hematocrit is 49.6 and he will proceed with phlebotomy 400 cc at this time. Repeat H&H in 2 months and in 4 months and he will return to clinic in 6 months with CBC, CMP, ferritin for possible phlebotomy. I will check MPL and CARL mutation in the next visit.  # Iron deficiency secondary to multiple phlebotomy. #OSA on CPAP machine.  Patient repor feeling tired and having daytime somnolence.  Advised patient to follow-up with primary care physician and pulmonology for further evaluation, and further optimizing CPAP parameter. We will hold iron supplementation. Orders Placed This Encounter  Procedures  . Hemoglobin and Hematocrit, Blood    Standing Status:   Future    Standing Expiration Date:   01/16/2020  . MPL mutation analysis    Standing Status:   Future    Standing Expiration Date:   01/16/2020  . Calreticulin (CALR) Mutation Analysis    Standing Status:   Future    Standing Expiration Date:   01/16/2020  . Hemoglobin and Hematocrit, Blood    Standing Status:   Future    Standing Expiration Date:   01/16/2020  .  CBC with Differential/Platelet    Standing Status:   Future    Standing Expiration Date:   01/16/2020  . Comprehensive metabolic panel    Standing Status:   Future    Standing Expiration Date:   01/16/2020  . Erythropoietin      Standing Status:   Future    Standing Expiration Date:   01/16/2020     Earlie Server, MD, PhD Hematology Oncology [facility] Pager- [phone] 01/15/2019

## 2019-03-19 ENCOUNTER — Other Ambulatory Visit: Payer: Medicare HMO

## 2019-03-30 ENCOUNTER — Other Ambulatory Visit: Payer: Self-pay

## 2019-04-02 ENCOUNTER — Inpatient Hospital Stay: Payer: Medicare HMO | Attending: Oncology

## 2019-04-02 ENCOUNTER — Other Ambulatory Visit: Payer: Self-pay

## 2019-04-02 DIAGNOSIS — D751 Secondary polycythemia: Secondary | ICD-10-CM | POA: Insufficient documentation

## 2019-04-02 LAB — COMPREHENSIVE METABOLIC PANEL
ALT: 25 U/L (ref 0–44)
AST: 22 U/L (ref 15–41)
Albumin: 4.3 g/dL (ref 3.5–5.0)
Alkaline Phosphatase: 60 U/L (ref 38–126)
Anion gap: 11 (ref 5–15)
BUN: 16 mg/dL (ref 8–23)
CO2: 20 mmol/L — ABNORMAL LOW (ref 22–32)
Calcium: 9.6 mg/dL (ref 8.9–10.3)
Chloride: 102 mmol/L (ref 98–111)
Creatinine, Ser: 1.15 mg/dL (ref 0.61–1.24)
GFR calc Af Amer: >60 mL/min (ref >60)
GFR calc non Af Amer: >60 mL/min (ref >60)
Glucose, Bld: 261 mg/dL — ABNORMAL HIGH (ref 70–99)
Potassium: 4.2 mmol/L (ref 3.5–5.1)
Sodium: 133 mmol/L — ABNORMAL LOW (ref 135–145)
Total Bilirubin: 0.7 mg/dL (ref 0.3–1.2)
Total Protein: 7.7 g/dL (ref 6.5–8.1)

## 2019-04-02 LAB — CBC WITH DIFFERENTIAL/PLATELET
Abs Immature Granulocytes: 0.04 10*3/uL (ref 0.00–0.07)
Basophils Absolute: 0 10*3/uL (ref 0.0–0.1)
Basophils Relative: 0 %
Eosinophils Absolute: 0.2 10*3/uL (ref 0.0–0.5)
Eosinophils Relative: 2 %
HCT: 50.3 % (ref 39.0–52.0)
Hemoglobin: 17.2 g/dL — ABNORMAL HIGH (ref 13.0–17.0)
Immature Granulocytes: 0 %
Lymphocytes Relative: 33 %
Lymphs Abs: 3 10*3/uL (ref 0.7–4.0)
MCH: 27.6 pg (ref 26.0–34.0)
MCHC: 34.2 g/dL (ref 30.0–36.0)
MCV: 80.6 fL (ref 80.0–100.0)
Monocytes Absolute: 0.7 10*3/uL (ref 0.1–1.0)
Monocytes Relative: 8 %
Neutro Abs: 5.1 10*3/uL (ref 1.7–7.7)
Neutrophils Relative %: 57 %
Platelets: 276 10*3/uL (ref 150–400)
RBC: 6.24 MIL/uL — ABNORMAL HIGH (ref 4.22–5.81)
RDW: 13.7 % (ref 11.5–15.5)
WBC: 9.1 10*3/uL (ref 4.0–10.5)
nRBC: 0 % (ref 0.0–0.2)

## 2019-04-03 ENCOUNTER — Other Ambulatory Visit: Payer: Medicare HMO

## 2019-04-03 LAB — ERYTHROPOIETIN: Erythropoietin: 8.8 m[IU]/mL (ref 2.6–18.5)

## 2019-04-04 ENCOUNTER — Telehealth: Payer: Self-pay | Admitting: *Deleted

## 2019-04-04 NOTE — Telephone Encounter (Signed)
Please add pt for WebEx appt tomorrow, I will call the patient and tell him

## 2019-04-04 NOTE — Telephone Encounter (Signed)
Patient calling asking if he is to come in tomorrow for his Phlebotomy. Please advise.  Per Dr Kem Parkinson orders he gets 400 drawn off if HCT > 47  CBC with Differential/Platelet  Order: [id number]  Status:  Final result  Visible to patient:  No (Not Released)  Next appt:  04/17/2019 at 10:00 AM in Oncology Earlie Server, MD)  Dx:  Erythrocytosis   Ref Range & Units 2d ago 76mo ago 44mo ago 81mo ago 47mo ago 27mo ago 10yr ago  WBC 4.0 - 10.5 K/uL 9.1  9.3  11.2High   8.7 R 10.8High  R 8.8 R 8.6 R  RBC 4.22 - 5.81 MIL/uL 6.24High   6.09High   6.32High   5.71 R 5.75 R 5.53 R 5.71 R  Hemoglobin 13.0 - 17.0 g/dL 17.2High   16.6  17.1High   16.0 R 16.2 R 14.9 R 15.1 R  HCT 39.0 - 52.0 % 50.3  49.6  50.9  47.0 R 47.3 R 43.9 R 44.2 R  MCV 80.0 - 100.0 fL 80.6  81.4  80.5  82.3  82.3  79.4Low   77.4Low    MCH 26.0 - 34.0 pg 27.6  27.3  27.1  28.0  28.1  27.0  26.4   MCHC 30.0 - 36.0 g/dL 34.2  33.5  33.6  34.1 R 34.2 R 33.9 R 34.1 R  RDW 11.5 - 15.5 % 13.7  13.3  14.4  13.8 R 14.3 R 17.0High  R 15.9High  R  Platelets 150 - 400 K/uL 276  260  266  259 R, CM 284 R 257 R 329 R  nRBC 0.0 - 0.2 % 0.0  0.0 CM 0.0 CM      Neutrophils Relative % % 57     51  45  60   Neutro Abs 1.7 - 7.7 K/uL 5.1     5.5 R 4.1 R 5.1 R  Lymphocytes Relative % 33     28  31  30    Lymphs Abs 0.7 - 4.0 K/uL 3.0     3.0 R 2.7 R 2.6 R  Monocytes Relative % 8     7  9  7    Monocytes Absolute 0.1 - 1.0 K/uL 0.7     0.7 R 0.8 R 0.6 R  Eosinophils Relative % 2     14  14  2    Eosinophils Absolute 0.0 - 0.5 K/uL 0.2     1.5High  R 1.2High  R 0.2 R  Basophils Relative % 0     0  1  1   Basophils Absolute 0.0 - 0.1 K/uL 0.0     0.0 R, CM 0.0 R, CM 0.1 R, CM  Immature Granulocytes % 0         Abs Immature Granulocytes 0.00 - 0.07 K/uL 0.04         Comment: Performed at [facility], [address] [place],  [address]  Resulting Agency  [facility] CL[facility] [place] [facility] [place] [facility] [place] [facility] [place] [facility] [place] [facility] [place] [facility]       Specimen Collected: 04/02/19 11:36  Last Resulted: 04/02/19 11:53

## 2019-04-04 NOTE — Telephone Encounter (Signed)
Correction Lorena, please add for a TeleVisit on 4/21.

## 2019-04-04 NOTE — Telephone Encounter (Signed)
Patient informed per VO Dr Tasia Catchings, no phlebotomy at this time. Confirmed virtual visit on 4/21 with patient

## 2019-04-05 LAB — CALRETICULIN (CALR) MUTATION ANALYSIS

## 2019-04-10 LAB — MPL MUTATION ANALYSIS

## 2019-04-17 ENCOUNTER — Ambulatory Visit: Payer: Medicare HMO | Admitting: Oncology

## 2019-04-17 ENCOUNTER — Inpatient Hospital Stay (HOSPITAL_BASED_OUTPATIENT_CLINIC_OR_DEPARTMENT_OTHER): Payer: Medicare HMO | Admitting: Oncology

## 2019-04-17 ENCOUNTER — Other Ambulatory Visit: Payer: Self-pay

## 2019-04-17 ENCOUNTER — Encounter: Payer: Self-pay | Admitting: Oncology

## 2019-04-17 DIAGNOSIS — G4733 Obstructive sleep apnea (adult) (pediatric): Secondary | ICD-10-CM | POA: Diagnosis not present

## 2019-04-17 DIAGNOSIS — D751 Secondary polycythemia: Secondary | ICD-10-CM

## 2019-04-17 MED ORDER — ASPIRIN EC 81 MG PO TBEC: 81.0000 mg | DELAYED_RELEASE_TABLET | Freq: Every day | ORAL | 1 refills | Status: DC

## 2019-04-17 NOTE — Progress Notes (Signed)
Patient currently at home. Telehealth visit done via phone. Patient states he has had a couple of dizzy spells but takes medication for motion sickness.

## 2019-04-17 NOTE — Progress Notes (Deleted)
HEMATOLOGY-ONCOLOGY TeleHEALTH VISIT PROGRESS NOTE  I connected with Benjamin [name] on 04/17/19 at 10:00 AM EDT by {Blank single:19197::"video enabled telemedicine visit","telephone visit"} and verified that I am speaking with the correct person using two identifiers. I discussed the limitations, risks, security and privacy concerns of performing an evaluation and management service by telemedicine and the availability of in-person appointments. I also discussed with the patient that there may be a patient responsible charge related to this service. The patient expressed understanding and agreed to proceed.   Other persons participating in the visit and their role in the encounter:  Geraldine Solar, [place], check in patient   [name], RN, check in patient.   Patient's location: Home  Provider's location: Home  Chief Complaint: ***    INTERVAL HISTORY Benjamin [name] is a 68 y.o. male who has above history reviewed by me today presents for follow up visit for management of  Problems and complaints are listed below:   Review of Systems - Oncology  Past Medical History:  Diagnosis Date  . Anxiety   . Arthritis   . Diabetes mellitus without complication ([place])   . Erythrocytosis 05/18/2015  . Erythrocytosis   . History of kidney stones   . 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 Polycythemia   . Sleep apnea    USES CPAP   Past Surgical History:  Procedure Laterality Date  . BLADDER STONE REMOVAL    . cataracts Bilateral   . COLONOSCOPY N/A 06/02/2015   Procedure: COLONOSCOPY;  Surgeon: [name], MD;  Location: [facility] ENDOSCOPY;  Service: Endoscopy;  Laterality: N/A;  . CYSTOSCOPY W/ RETROGRADES Left 11/01/2017   Procedure: CYSTOSCOPY WITH RETROGRADE PYELOGRAM;  Surgeon: [name], MD;  Location: [facility] ORS;  Service: Urology;  Laterality: Left;  . CYSTOSCOPY WITH STENT PLACEMENT Left  11/01/2017   Procedure: CYSTOSCOPY WITH STENT PLACEMENT;  Surgeon: [name], MD;  Location: [facility] ORS;  Service: Urology;  Laterality: Left;    Family History  Problem Relation Age of Onset  . Heart disease Mother   . Diabetes Father     Social History   Socioeconomic History  . Marital status: Married    Spouse name: Not on file  . Number of children: Not on file  . Years of education: Not on file  . Highest education level: Not on file  Occupational History  . Not on file  Social Needs  . Financial resource strain: Not on file  . Food insecurity:    Worry: Not on file    Inability: Not on file  . Transportation needs:    Medical: Not on file    Non-medical: Not on file  Tobacco Use  . Smoking status: Never Smoker  . Smokeless tobacco: Never Used  Substance and Sexual Activity  . Alcohol use: No  . Drug use: No  . Sexual activity: Not on file  Lifestyle  . Physical activity:    Days per week: Not on file    Minutes per session: Not on file  . Stress: Not on file  Relationships  . Social connections:    Talks on phone: Not on file    Gets together: Not on file    Attends religious service: Not on file    Active member of club or organization: Not on file    Attends meetings of clubs or organizations: Not on file  Relationship status: Not on file  . Intimate partner violence:    Fear of current or ex partner: Not on file    Emotionally abused: Not on file    Physically abused: Not on file    Forced sexual activity: Not on file  Other Topics Concern  . Not on file  Social History Narrative  . Not on file    Current Outpatient Medications on File Prior to Visit  Medication Sig Dispense Refill  . glipiZIDE (GLUCOTROL) 10 MG tablet Take 10 mg by mouth 2 (two) times daily before a meal.    . HYDROcodone-acetaminophen (NORCO/VICODIN) 5-325 MG tablet Take 1 tablet by mouth 2 (two) times daily as needed for moderate pain. Reported on 06/15/2016 30 tablet 0  .  insulin NPH Human (NOVOLIN N) 100 UNIT/ML injection Inject into the skin. 20 units every evening    . lisinopril (PRINIVIL,ZESTRIL) 2.5 MG tablet Take 2.5 mg by mouth at bedtime.    Marland Kitchen LORazepam (ATIVAN) 1 MG tablet Take 1 tablet (1 mg total) by mouth daily. 15 tablet 0  . metFORMIN (GLUCOPHAGE-XR) 500 MG 24 hr tablet Take 1,000 mg by mouth 2 (two) times daily.    . pioglitazone (ACTOS) 15 MG tablet TK 1 T PO ONCE D    . pravastatin (PRAVACHOL) 10 MG tablet Take 10 mg by mouth at bedtime.     . terbinafine (LAMISIL) 250 MG tablet Take 250 mg by mouth at bedtime.     Current Facility-Administered Medications on File Prior to Visit  Medication Dose Route Frequency Provider Last Rate Last Dose  . ciprofloxacin (CIPRO) tablet 500 mg  500 mg Oral Once Stoioff, Scott C, MD      . lidocaine (XYLOCAINE) 2 % jelly 1 application  1 application Urethral Once Stoioff, Ronda Fairly, MD        Allergies  Allergen Reactions  . Sertraline Hcl Other (See Comments)    Vivid dreams       Observations/Objective: There were no vitals filed for this visit. There is no height or weight on file to calculate BMI.  Physical Exam  CBC    Component Value Date/Time   WBC 9.1 04/02/2019 1136   RBC 6.24 (H) 04/02/2019 1136   HGB 17.2 (H) 04/02/2019 1136   HGB 15.0 01/28/2015 1354   HCT 50.3 04/02/2019 1136   HCT 46.5 04/01/2015 1354   PLT 276 04/02/2019 1136   PLT 339 01/28/2015 1354   MCV 80.6 04/02/2019 1136   MCV 74 (L) 01/28/2015 1354   MCH 27.6 04/02/2019 1136   MCHC 34.2 04/02/2019 1136   RDW 13.7 04/02/2019 1136   RDW 16.3 (H) 01/28/2015 1354   LYMPHSABS 3.0 04/02/2019 1136   LYMPHSABS 3.6 01/28/2015 1354   MONOABS 0.7 04/02/2019 1136   MONOABS 1.0 01/28/2015 1354   EOSABS 0.2 04/02/2019 1136   EOSABS 0.3 01/28/2015 1354   BASOSABS 0.0 04/02/2019 1136   BASOSABS 0.1 01/28/2015 1354    CMP     Component Value Date/Time   NA 133 (L) 04/02/2019 1136   K 4.2 04/02/2019 1136   CL 102 04/02/2019  1136   CO2 20 (L) 04/02/2019 1136   GLUCOSE 261 (H) 04/02/2019 1136   BUN 16 04/02/2019 1136   CREATININE 1.15 04/02/2019 1136   CREATININE 1.06 08/29/2012 0907   CALCIUM 9.6 04/02/2019 1136   PROT 7.7 04/02/2019 1136   ALBUMIN 4.3 04/02/2019 1136   AST 22 04/02/2019 1136   ALT 25 04/02/2019 1136  ALKPHOS 60 04/02/2019 1136   BILITOT 0.7 04/02/2019 1136   GFRNONAA >60 04/02/2019 1136   GFRNONAA >60 08/29/2012 0907   GFRAA >60 04/02/2019 1136   GFRAA >60 08/29/2012 0907     Assessment and Plan: No diagnosis found.    Follow Up Instructions:    I discussed the assessment and treatment plan with the patient. The patient was provided an opportunity to ask questions and all were answered. The patient agreed with the plan and demonstrated an understanding of the instructions.  The patient was advised to call back or seek an in-person evaluation if the symptoms worsen or if the condition fails to improve as anticipated.   I provided *** minutes of {Blank single:19197::"face-to-face video visit time","non face-to-face telephone visit time"} during this encounter, and > 50% was spent counseling as documented under my assessment & plan.  Earlie Server, MD 04/17/2019 3:22 PM

## 2019-04-18 NOTE — Progress Notes (Deleted)
HEMATOLOGY-ONCOLOGY TeleHEALTH VISIT PROGRESS NOTE  I connected with Benjamin [name] on 04/18/19 at 10:00 AM EDT by telephone visit and verified that I am speaking with the correct person using two identifiers. I discussed the limitations, risks, security and privacy concerns of performing an evaluation and management service by telemedicine and the availability of in-person appointments. I also discussed with the patient that there may be a patient responsible charge related to this service. The patient expressed understanding and agreed to proceed.   Other persons participating in the visit and their role in the encounter:  Geraldine Solar, [place], check in patient   [name], RN, check in patient.   Patient's location: Home  Provider's location: work  Risk analyst Complaint: Follow-up for management of secondary polycythemia.   INTERVAL HISTORY Benjamin [name] is a 68 y.o. male who has above history reviewed by me today presents for follow up visit for management of secondary polycythemia. Problems and complaints are listed below:  Patient has switched care to me on 01/15/2019. He has a chronic history of polycythemia, previous work-up showed negative Jak 2V617 F-, exon 12-15 mutation negative. I obtained further testing including MPL, carl mutation which both are negative. 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  Patient reports being compliant using CPAP machine at home. Chronic fatigue at baseline. Reports that he had couple dizzy spells but takes medication for motion sickness. Other than that, he has no new complaints. Review of Systems  Constitutional: Positive for fatigue. Negative for appetite change, chills, fever and unexpected weight change.  HENT:   Negative for hearing loss and voice change.   Eyes: Negative for eye problems and icterus.  Respiratory: Negative for chest tightness, cough and shortness of breath.   Cardiovascular: Negative for chest pain and leg  swelling.  Gastrointestinal: Negative for abdominal distention and abdominal pain.  Endocrine: Negative for hot flashes.  Genitourinary: Negative for difficulty urinating, dysuria and frequency.   Musculoskeletal: Negative for arthralgias.  Skin: Negative for itching and rash.  Neurological: Positive for dizziness. Negative for light-headedness and numbness.  Hematological: Negative for adenopathy. Does not bruise/bleed easily.  Psychiatric/Behavioral: Negative for confusion.    Past Medical History:  Diagnosis Date  . Anxiety   . Arthritis   . Diabetes mellitus without complication ([place])   . Erythrocytosis 05/18/2015  . Erythrocytosis   . History of kidney stones   . 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 Polycythemia   . Sleep apnea    USES CPAP   Past Surgical History:  Procedure Laterality Date  . BLADDER STONE REMOVAL    . cataracts Bilateral   . COLONOSCOPY N/A 06/02/2015   Procedure: COLONOSCOPY;  Surgeon: [name], MD;  Location: [facility] ENDOSCOPY;  Service: Endoscopy;  Laterality: N/A;  . CYSTOSCOPY W/ RETROGRADES Left 11/01/2017   Procedure: CYSTOSCOPY WITH RETROGRADE PYELOGRAM;  Surgeon: [name], MD;  Location: [facility] ORS;  Service: Urology;  Laterality: Left;  . CYSTOSCOPY WITH STENT PLACEMENT Left 11/01/2017   Procedure: CYSTOSCOPY WITH STENT PLACEMENT;  Surgeon: [name], MD;  Location: [facility] ORS;  Service: Urology;  Laterality: Left;    Family History  Problem Relation Age of Onset  . Heart disease Mother   . Diabetes Father     Social History   Socioeconomic History  . Marital status: Married    Spouse name: Not on file  . Number of children: Not  on file  . Years of education: Not on file  . Highest education level: Not on file  Occupational History  . Not on file  Social Needs  . Financial resource strain: Not on file  . Food insecurity:    Worry: Not on  file    Inability: Not on file  . Transportation needs:    Medical: Not on file    Non-medical: Not on file  Tobacco Use  . Smoking status: Never Smoker  . Smokeless tobacco: Never Used  Substance and Sexual Activity  . Alcohol use: No  . Drug use: No  . Sexual activity: Not on file  Lifestyle  . Physical activity:    Days per week: Not on file    Minutes per session: Not on file  . Stress: Not on file  Relationships  . Social connections:    Talks on phone: Not on file    Gets together: Not on file    Attends religious service: Not on file    Active member of club or organization: Not on file    Attends meetings of clubs or organizations: Not on file    Relationship status: Not on file  . Intimate partner violence:    Fear of current or ex partner: Not on file    Emotionally abused: Not on file    Physically abused: Not on file    Forced sexual activity: Not on file  Other Topics Concern  . Not on file  Social History Narrative  . Not on file    Current Outpatient Medications on File Prior to Visit  Medication Sig Dispense Refill  . glipiZIDE (GLUCOTROL) 10 MG tablet Take 10 mg by mouth 2 (two) times daily before a meal.    . HYDROcodone-acetaminophen (NORCO/VICODIN) 5-325 MG tablet Take 1 tablet by mouth 2 (two) times daily as needed for moderate pain. Reported on 06/15/2016 30 tablet 0  . insulin NPH Human (NOVOLIN N) 100 UNIT/ML injection Inject into the skin. 20 units every evening    . lisinopril (PRINIVIL,ZESTRIL) 2.5 MG tablet Take 2.5 mg by mouth at bedtime.    Marland Kitchen LORazepam (ATIVAN) 1 MG tablet Take 1 tablet (1 mg total) by mouth daily. 15 tablet 0  . metFORMIN (GLUCOPHAGE-XR) 500 MG 24 hr tablet Take 1,000 mg by mouth 2 (two) times daily.    . pioglitazone (ACTOS) 15 MG tablet TK 1 T PO ONCE D    . pravastatin (PRAVACHOL) 10 MG tablet Take 10 mg by mouth at bedtime.     . terbinafine (LAMISIL) 250 MG tablet Take 250 mg by mouth at bedtime.     Current  Facility-Administered Medications on File Prior to Visit  Medication Dose Route Frequency Provider Last Rate Last Dose  . ciprofloxacin (CIPRO) tablet 500 mg  500 mg Oral Once Stoioff, Scott C, MD      . lidocaine (XYLOCAINE) 2 % jelly 1 application  1 application Urethral Once Stoioff, Ronda Fairly, MD        Allergies  Allergen Reactions  . Sertraline Hcl Other (See Comments)    Vivid dreams       Observations/Objective: There were no vitals filed for this visit. There is no height or weight on file to calculate BMI.  Pain level 0 Physical Exam  CBC    Component Value Date/Time   WBC 9.1 04/02/2019 1136   RBC 6.24 (H) 04/02/2019 1136   HGB 17.2 (H) 04/02/2019 1136   HGB 15.0 01/28/2015 1354  HCT 50.3 04/02/2019 1136   HCT 46.5 04/01/2015 1354   PLT 276 04/02/2019 1136   PLT 339 01/28/2015 1354   MCV 80.6 04/02/2019 1136   MCV 74 (L) 01/28/2015 1354   MCH 27.6 04/02/2019 1136   MCHC 34.2 04/02/2019 1136   RDW 13.7 04/02/2019 1136   RDW 16.3 (H) 01/28/2015 1354   LYMPHSABS 3.0 04/02/2019 1136   LYMPHSABS 3.6 01/28/2015 1354   MONOABS 0.7 04/02/2019 1136   MONOABS 1.0 01/28/2015 1354   EOSABS 0.2 04/02/2019 1136   EOSABS 0.3 01/28/2015 1354   BASOSABS 0.0 04/02/2019 1136   BASOSABS 0.1 01/28/2015 1354    CMP     Component Value Date/Time   NA 133 (L) 04/02/2019 1136   K 4.2 04/02/2019 1136   CL 102 04/02/2019 1136   CO2 20 (L) 04/02/2019 1136   GLUCOSE 261 (H) 04/02/2019 1136   BUN 16 04/02/2019 1136   CREATININE 1.15 04/02/2019 1136   CREATININE 1.06 08/29/2012 0907   CALCIUM 9.6 04/02/2019 1136   PROT 7.7 04/02/2019 1136   ALBUMIN 4.3 04/02/2019 1136   AST 22 04/02/2019 1136   ALT 25 04/02/2019 1136   ALKPHOS 60 04/02/2019 1136   BILITOT 0.7 04/02/2019 1136   GFRNONAA >60 04/02/2019 1136   GFRNONAA >60 08/29/2012 0907   GFRAA >60 04/02/2019 1136   GFRAA >60 08/29/2012 0907     Assessment and Plan: 1. Polycythemia, secondary   2. OSA (obstructive  sleep apnea)     Labs are reviewed and discussed with patient in details. His hemoglobin has increased to 17.2, with a hematocrit 50.3. Given that he has secondary polycythemia, which is a compensating process due to hypoxia secondary to OSA, I recommend holding phlebotomy, especially in the context of COVID-19 pandemic Recommend patient to start taking aspirin 81 mg daily.  Should he experience any focal weakness, neurologic deficits he needs to seek medical advice immediately.  He voices understanding and agrees with the plan. We will repeat labs in 4 weeks to determine if he needs phlebotomy. I will set his phlebotomy threshold to be hematocrit above 50.  Follow Up Instructions: Lab MD possible phlebotomy in 4 weeks.   I discussed the assessment and treatment plan with the patient. The patient was provided an opportunity to ask questions and all were answered. The patient agreed with the plan and demonstrated an understanding of the instructions.  The patient was advised to call back or seek an in-person evaluation if the symptoms worsen or if the condition fails to improve as anticipated.   I provided *** minutes of {Blank single:19197::"face-to-face video visit time","non face-to-face telephone visit time"} during this encounter, and > 50% was spent counseling as documented under my assessment & plan.  Earlie Server, MD 04/18/2019 5:11 PM

## 2019-04-18 NOTE — Progress Notes (Signed)
HEMATOLOGY-ONCOLOGY TeleHEALTH VISIT PROGRESS NOTE  I connected with Benjamin [name] on 04/18/19 at 10:00 AM EDT by telephone visit and verified that I am speaking with the correct person using two identifiers. I discussed the limitations, risks, security and privacy concerns of performing an evaluation and management service by telemedicine and the availability of in-person appointments. I also discussed with the patient that there may be a patient responsible charge related to this service. The patient expressed understanding and agreed to proceed.   Other persons participating in the visit and their role in the encounter:  Geraldine Solar, [place], check in patient   [name], RN, check in patient.   Patient's location: Home  Provider's location: work  Risk analyst Complaint: Follow-up for management of secondary polycythemia.   INTERVAL HISTORY Benjamin [name] is a 68 y.o. male who has above history reviewed by me today presents for follow up visit for management of secondary polycythemia. Problems and complaints are listed below:  Patient has switched care to me on 01/15/2019. He has a chronic history of polycythemia, previous work-up showed negative Jak 2 V617 F-, exon 12-15 mutation negative. I obtained further testing including MPL, carl mutation which both are negative. 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  Patient reports being compliant using CPAP machine at home. Chronic fatigue at baseline. Reports that he had couple dizzy spells but takes medication for motion sickness. Other than that, he has no new complaints.   Review of Systems  Constitutional: Positive for fatigue. Negative for appetite change, chills, fever and unexpected weight change.  HENT:   Negative for hearing loss and voice change.   Eyes: Negative for eye problems and icterus.  Respiratory: Negative for chest tightness, cough and shortness of breath.   Cardiovascular: Negative for chest pain  and leg swelling.  Gastrointestinal: Negative for abdominal distention and abdominal pain.  Endocrine: Negative for hot flashes.  Genitourinary: Negative for difficulty urinating, dysuria and frequency.   Musculoskeletal: Negative for arthralgias.  Skin: Negative for itching and rash.  Neurological: Positive for dizziness. Negative for light-headedness and numbness.  Hematological: Negative for adenopathy. Does not bruise/bleed easily.  Psychiatric/Behavioral: Negative for confusion.    Past Medical History:  Diagnosis Date  . Anxiety   . Arthritis   . Diabetes mellitus without complication ([place])   . Erythrocytosis 05/18/2015  . Erythrocytosis   . History of kidney stones   . 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 Polycythemia   . Sleep apnea    USES CPAP   Past Surgical History:  Procedure Laterality Date  . BLADDER STONE REMOVAL    . cataracts Bilateral   . COLONOSCOPY N/A 06/02/2015   Procedure: COLONOSCOPY;  Surgeon: [name], MD;  Location: [facility] ENDOSCOPY;  Service: Endoscopy;  Laterality: N/A;  . CYSTOSCOPY W/ RETROGRADES Left 11/01/2017   Procedure: CYSTOSCOPY WITH RETROGRADE PYELOGRAM;  Surgeon: [name], MD;  Location: [facility] ORS;  Service: Urology;  Laterality: Left;  . CYSTOSCOPY WITH STENT PLACEMENT Left 11/01/2017   Procedure: CYSTOSCOPY WITH STENT PLACEMENT;  Surgeon: [name], MD;  Location: [facility] ORS;  Service: Urology;  Laterality: Left;    Family History  Problem Relation Age of Onset  . Heart disease Mother   . Diabetes Father     Social History   Socioeconomic History  . Marital status: Married    Spouse name: Not on file  . Number  of children: Not on file  . Years of education: Not on file  . Highest education level: Not on file  Occupational History  . Not on file  Social Needs  . Financial resource strain: Not on file  . Food insecurity:     Worry: Not on file    Inability: Not on file  . Transportation needs:    Medical: Not on file    Non-medical: Not on file  Tobacco Use  . Smoking status: Never Smoker  . Smokeless tobacco: Never Used  Substance and Sexual Activity  . Alcohol use: No  . Drug use: No  . Sexual activity: Not on file  Lifestyle  . Physical activity:    Days per week: Not on file    Minutes per session: Not on file  . Stress: Not on file  Relationships  . Social connections:    Talks on phone: Not on file    Gets together: Not on file    Attends religious service: Not on file    Active member of club or organization: Not on file    Attends meetings of clubs or organizations: Not on file    Relationship status: Not on file  . Intimate partner violence:    Fear of current or ex partner: Not on file    Emotionally abused: Not on file    Physically abused: Not on file    Forced sexual activity: Not on file  Other Topics Concern  . Not on file  Social History Narrative  . Not on file    Current Outpatient Medications on File Prior to Visit  Medication Sig Dispense Refill  . glipiZIDE (GLUCOTROL) 10 MG tablet Take 10 mg by mouth 2 (two) times daily before a meal.    . HYDROcodone-acetaminophen (NORCO/VICODIN) 5-325 MG tablet Take 1 tablet by mouth 2 (two) times daily as needed for moderate pain. Reported on 06/15/2016 30 tablet 0  . insulin NPH Human (NOVOLIN N) 100 UNIT/ML injection Inject into the skin. 20 units every evening    . lisinopril (PRINIVIL,ZESTRIL) 2.5 MG tablet Take 2.5 mg by mouth at bedtime.    Marland Kitchen LORazepam (ATIVAN) 1 MG tablet Take 1 tablet (1 mg total) by mouth daily. 15 tablet 0  . metFORMIN (GLUCOPHAGE-XR) 500 MG 24 hr tablet Take 1,000 mg by mouth 2 (two) times daily.    . pioglitazone (ACTOS) 15 MG tablet TK 1 T PO ONCE D    . pravastatin (PRAVACHOL) 10 MG tablet Take 10 mg by mouth at bedtime.     . terbinafine (LAMISIL) 250 MG tablet Take 250 mg by mouth at bedtime.      Current Facility-Administered Medications on File Prior to Visit  Medication Dose Route Frequency Provider Last Rate Last Dose  . ciprofloxacin (CIPRO) tablet 500 mg  500 mg Oral Once Stoioff, Scott C, MD      . lidocaine (XYLOCAINE) 2 % jelly 1 application  1 application Urethral Once Stoioff, Ronda Fairly, MD        Allergies  Allergen Reactions  . Sertraline Hcl Other (See Comments)    Vivid dreams       Observations/Objective: There were no vitals filed for this visit. There is no height or weight on file to calculate BMI.  Pain level 0 Physical Exam  CBC    Component Value Date/Time   WBC 9.1 04/02/2019 1136   RBC 6.24 (H) 04/02/2019 1136   HGB 17.2 (H) 04/02/2019 1136   HGB  15.0 01/28/2015 1354   HCT 50.3 04/02/2019 1136   HCT 46.5 04/01/2015 1354   PLT 276 04/02/2019 1136   PLT 339 01/28/2015 1354   MCV 80.6 04/02/2019 1136   MCV 74 (L) 01/28/2015 1354   MCH 27.6 04/02/2019 1136   MCHC 34.2 04/02/2019 1136   RDW 13.7 04/02/2019 1136   RDW 16.3 (H) 01/28/2015 1354   LYMPHSABS 3.0 04/02/2019 1136   LYMPHSABS 3.6 01/28/2015 1354   MONOABS 0.7 04/02/2019 1136   MONOABS 1.0 01/28/2015 1354   EOSABS 0.2 04/02/2019 1136   EOSABS 0.3 01/28/2015 1354   BASOSABS 0.0 04/02/2019 1136   BASOSABS 0.1 01/28/2015 1354    CMP     Component Value Date/Time   NA 133 (L) 04/02/2019 1136   K 4.2 04/02/2019 1136   CL 102 04/02/2019 1136   CO2 20 (L) 04/02/2019 1136   GLUCOSE 261 (H) 04/02/2019 1136   BUN 16 04/02/2019 1136   CREATININE 1.15 04/02/2019 1136   CREATININE 1.06 08/29/2012 0907   CALCIUM 9.6 04/02/2019 1136   PROT 7.7 04/02/2019 1136   ALBUMIN 4.3 04/02/2019 1136   AST 22 04/02/2019 1136   ALT 25 04/02/2019 1136   ALKPHOS 60 04/02/2019 1136   BILITOT 0.7 04/02/2019 1136   GFRNONAA >60 04/02/2019 1136   GFRNONAA >60 08/29/2012 0907   GFRAA >60 04/02/2019 1136   GFRAA >60 08/29/2012 0907     Assessment and Plan: 1. Polycythemia, secondary   2. OSA  (obstructive sleep apnea)     Labs are reviewed and discussed with patient in details. Previous work-up showed Jak 2 V617 F negative, exon 12-15 mutation negative. I obtained further testing including MPL, carl mutation which both are negative His hemoglobin has increased to 17.2, with a hematocrit 50.3. Given that he has secondary polycythemia, which is a compensating process due to hypoxia secondary to OSA, I recommend holding phlebotomy, especially in the context of COVID-19 pandemic Recommend patient to start taking aspirin 81 mg daily.  Should he experience any focal weakness, neurologic deficits he needs to seek medical advice immediately.  He voices understanding and agrees with the plan. We will repeat labs in 4 weeks to determine if he needs phlebotomy. I will set his phlebotomy threshold to be hematocrit above 50.  Follow Up Instructions: Lab MD possible phlebotomy in 4 weeks.   I discussed the assessment and treatment plan with the patient. The patient was provided an opportunity to ask questions and all were answered. The patient agreed with the plan and demonstrated an understanding of the instructions.  The patient was advised to call back or seek an in-person evaluation if the symptoms worsen or if the condition fails to improve as anticipated.   I provided 11 minutes of non face-to-face telephone visit time during this encounter, and > 50% was spent counseling as documented under my assessment & plan.  Earlie Server, MD 04/18/2019 5:11 PM

## 2019-05-14 ENCOUNTER — Other Ambulatory Visit: Payer: Self-pay

## 2019-05-14 DIAGNOSIS — E1169 Type 2 diabetes mellitus with other specified complication: Secondary | ICD-10-CM | POA: Insufficient documentation

## 2019-05-15 ENCOUNTER — Inpatient Hospital Stay: Payer: Medicare HMO | Attending: Oncology

## 2019-05-15 ENCOUNTER — Other Ambulatory Visit: Payer: Self-pay

## 2019-05-15 ENCOUNTER — Inpatient Hospital Stay: Payer: Medicare HMO

## 2019-05-15 ENCOUNTER — Inpatient Hospital Stay (HOSPITAL_BASED_OUTPATIENT_CLINIC_OR_DEPARTMENT_OTHER): Payer: Medicare HMO | Admitting: Oncology

## 2019-05-15 ENCOUNTER — Encounter: Payer: Self-pay | Admitting: Oncology

## 2019-05-15 VITALS — BP 130/87 | HR 94 | Temp 97.9°F | Resp 18 | Wt 248.6 lb

## 2019-05-15 VITALS — BP 121/80 | HR 90 | Resp 18

## 2019-05-15 DIAGNOSIS — Z7984 Long term (current) use of oral hypoglycemic drugs: Secondary | ICD-10-CM

## 2019-05-15 DIAGNOSIS — E119 Type 2 diabetes mellitus without complications: Secondary | ICD-10-CM

## 2019-05-15 DIAGNOSIS — Z79899 Other long term (current) drug therapy: Secondary | ICD-10-CM | POA: Diagnosis not present

## 2019-05-15 DIAGNOSIS — Z7982 Long term (current) use of aspirin: Secondary | ICD-10-CM | POA: Diagnosis not present

## 2019-05-15 DIAGNOSIS — D500 Iron deficiency anemia secondary to blood loss (chronic): Secondary | ICD-10-CM | POA: Insufficient documentation

## 2019-05-15 DIAGNOSIS — D751 Secondary polycythemia: Secondary | ICD-10-CM

## 2019-05-15 DIAGNOSIS — F419 Anxiety disorder, unspecified: Secondary | ICD-10-CM | POA: Insufficient documentation

## 2019-05-15 DIAGNOSIS — M129 Arthropathy, unspecified: Secondary | ICD-10-CM

## 2019-05-15 DIAGNOSIS — G4733 Obstructive sleep apnea (adult) (pediatric): Secondary | ICD-10-CM

## 2019-05-15 DIAGNOSIS — Z87442 Personal history of urinary calculi: Secondary | ICD-10-CM | POA: Insufficient documentation

## 2019-05-15 LAB — HEMOGLOBIN AND HEMATOCRIT, BLOOD
HCT: 50.5 % (ref 39.0–52.0)
Hemoglobin: 17.1 g/dL — ABNORMAL HIGH (ref 13.0–17.0)

## 2019-05-15 NOTE — Progress Notes (Signed)
Patient here for follow up. No concerns voiced.  °

## 2019-05-19 NOTE — Progress Notes (Signed)
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  BCR able and Jak 2 mutation testing was negative in 2011.  He does have a history of obstructive sleep apnea and uses CPAP.  He is [name] patient.  He was initially getting phlebotomy every 2 weeks but now gets it every 2 months if need be.  His threshold for phlebotomy is a hematocrit of greater than 47.  Patient does report improvement in his fatigue for a few days after he gets a phlebotomy   patient previously follows up with [name]. He switches care to me on 01/15/2019.  06/15/2016 JAK2 V617F negative, Exons 12-15 mutation negative.  06/15/2017 Erythropoietin 12.8.  05/15/2019 further testing including MPL, carl mutation which both are negative  Interval history-  68 y.o. male with a history of erythrocytosis present to follow-up. Reports chronic mild fatigue at baseline. Reports being compliant with CPAP machine for OSA. Denies weight loss, fever, chills, fatigue, night sweats.   Review of systems- Review of Systems  Constitutional: Positive for malaise/fatigue. Negative for chills, fever and weight loss.  HENT: Negative for congestion, ear discharge, nosebleeds and sore throat.   Eyes: Negative for blurred vision and redness.  Respiratory: Negative for cough, hemoptysis, sputum production, shortness of breath and wheezing.   Cardiovascular: Negative for chest pain, palpitations, orthopnea, claudication and leg swelling.  Gastrointestinal: Negative for abdominal pain,  blood in stool, constipation, diarrhea, heartburn, melena, nausea and vomiting.  Genitourinary: Negative for dysuria, flank pain, frequency, hematuria and urgency.  Musculoskeletal: Negative for back pain, joint pain and myalgias.  Skin: Negative for rash.  Neurological: Negative for dizziness, tingling, tremors, focal weakness, seizures, weakness and headaches.  Endo/Heme/Allergies: Does not bruise/bleed easily.  Psychiatric/Behavioral: Negative for depression, hallucinations and suicidal ideas. The patient does not have insomnia.        Allergies  Allergen Reactions  . Sertraline Hcl Other (See Comments)    Vivid dreams     Past Medical History:  Diagnosis Date  . Anxiety   . Arthritis   . Diabetes mellitus without complication ([place])   . Erythrocytosis 05/18/2015  . Erythrocytosis   . History of kidney stones   . 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 Polycythemia   . Sleep apnea    USES CPAP     Past Surgical History:  Procedure Laterality Date  . BLADDER STONE REMOVAL    . cataracts Bilateral   . COLONOSCOPY N/A 06/02/2015   Procedure: COLONOSCOPY;  Surgeon: [name], MD;  Location: [facility] ENDOSCOPY;  Service: Endoscopy;  Laterality: N/A;  . CYSTOSCOPY W/ RETROGRADES Left 11/01/2017   Procedure: CYSTOSCOPY WITH RETROGRADE PYELOGRAM;  Surgeon: [name], MD;  Location: [facility] ORS;  Service: Urology;  Laterality: Left;  . CYSTOSCOPY WITH STENT PLACEMENT Left 11/01/2017   Procedure: CYSTOSCOPY WITH STENT PLACEMENT;  Surgeon: [name], MD;  Location: [facility] ORS;  Service: Urology;  Laterality: Left;    Social History   Socioeconomic History  . Marital status:  Married    Spouse name: Not on file  . Number of children: Not on file  . Years of education: Not on file  . Highest education level: Not on file  Occupational History  . Not on file  Social Needs  . Financial  resource strain: Not on file  . Food insecurity:    Worry: Not on file    Inability: Not on file  . Transportation needs:    Medical: Not on file    Non-medical: Not on file  Tobacco Use  . Smoking status: Never Smoker  . Smokeless tobacco: Never Used  Substance and Sexual Activity  . Alcohol use: No  . Drug use: No  . Sexual activity: Not on file  Lifestyle  . Physical activity:    Days per week: Not on file    Minutes per session: Not on file  . Stress: Not on file  Relationships  . Social connections:    Talks on phone: Not on file    Gets together: Not on file    Attends religious service: Not on file    Active member of club or organization: Not on file    Attends meetings of clubs or organizations: Not on file    Relationship status: Not on file  . Intimate partner violence:    Fear of current or ex partner: Not on file    Emotionally abused: Not on file    Physically abused: Not on file    Forced sexual activity: Not on file  Other Topics Concern  . Not on file  Social History Narrative  . Not on file    Family History  Problem Relation Age of Onset  . Heart disease Mother   . Diabetes Father      Current Outpatient Medications:  .  aspirin EC 81 MG tablet, Take 1 tablet (81 mg total) by mouth daily., Disp: 90 tablet, Rfl: 1 .  glipiZIDE (GLUCOTROL) 10 MG tablet, Take 10 mg by mouth 2 (two) times daily before a meal., Disp: , Rfl:  .  HYDROcodone-acetaminophen (NORCO/VICODIN) 5-325 MG tablet, Take 1 tablet by mouth 2 (two) times daily as needed for moderate pain. Reported on 06/15/2016, Disp: 30 tablet, Rfl: 0 .  insulin NPH Human (NOVOLIN N) 100 UNIT/ML injection, Inject into the skin. 20 units every evening, Disp: , Rfl:  .  lisinopril (PRINIVIL,ZESTRIL) 2.5 MG tablet, Take 2.5 mg by mouth at bedtime., Disp: , Rfl:  .  LORazepam (ATIVAN) 1 MG tablet, Take 1 tablet (1 mg total) by mouth daily., Disp: 15 tablet, Rfl: 0 .  metFORMIN (GLUCOPHAGE-XR) 500 MG 24  hr tablet, Take 1,000 mg by mouth 2 (two) times daily., Disp: , Rfl:  .  pioglitazone (ACTOS) 15 MG tablet, TK 1 T PO ONCE D, Disp: , Rfl:  .  pravastatin (PRAVACHOL) 10 MG tablet, Take 10 mg by mouth at bedtime. , Disp: , Rfl:  .  terbinafine (LAMISIL) 250 MG tablet, Take 250 mg by mouth at bedtime., Disp: , Rfl:  .  Semaglutide,0.25 or 0.5MG /DOS, 2 MG/1.5ML SOPN, Inject into the skin., Disp: , Rfl:   Current Facility-Administered Medications:  .  ciprofloxacin (CIPRO) tablet 500 mg, 500 mg, Oral, Once, Stoioff, Scott C, MD .  lidocaine (XYLOCAINE) 2 % jelly 1 application, 1 application, Urethral, Once, Stoioff, Scott C, MD  Physical exam:  Vitals:   05/15/19 1418  BP: 130/87  Pulse: 94  Resp: 18  Temp: 97.9 F (36.6 C)  Weight:  248 lb 9.6 oz (112.8 kg)   Physical Exam Constitutional:      General: He is not in acute distress.    Appearance: Normal appearance. He is obese.  HENT:     Head: Normocephalic and atraumatic.  Eyes:     General: No scleral icterus.    Pupils: Pupils are equal, round, and reactive to light.  Neck:     Musculoskeletal: Normal range of motion and neck supple.  Cardiovascular:     Rate and Rhythm: Normal rate and regular rhythm.     Heart sounds: Normal heart sounds.  Pulmonary:     Effort: Pulmonary effort is normal. No respiratory distress.     Breath sounds: Normal breath sounds. No wheezing.  Abdominal:     General: Bowel sounds are normal. There is no distension.     Palpations: Abdomen is soft. There is no mass.     Tenderness: There is no abdominal tenderness.  Musculoskeletal: Normal range of motion.        General: No deformity.  Skin:    General: Skin is warm and dry.     Findings: No erythema or rash.  Neurological:     Mental Status: He is alert and oriented to person, place, and time.     Cranial Nerves: No cranial nerve deficit.     Coordination: Coordination normal.  Psychiatric:        Behavior: Behavior normal.         Thought Content: Thought content normal.      CMP Latest Ref Rng & Units 04/02/2019  Glucose 70 - 99 mg/dL 261(H)  BUN 8 - 23 mg/dL 16  Creatinine 0.61 - 1.24 mg/dL 1.15  Sodium 135 - 145 mmol/L 133(L)  Potassium 3.5 - 5.1 mmol/L 4.2  Chloride 98 - 111 mmol/L 102  CO2 22 - 32 mmol/L 20(L)  Calcium 8.9 - 10.3 mg/dL 9.6  Total Protein 6.5 - 8.1 g/dL 7.7  Total Bilirubin 0.3 - 1.2 mg/dL 0.7  Alkaline Phos 38 - 126 U/L 60  AST 15 - 41 U/L 22  ALT 0 - 44 U/L 25   CBC Latest Ref Rng & Units 05/15/2019  WBC 4.0 - 10.5 K/uL -  Hemoglobin 13.0 - 17.0 g/dL 17.1(H)  Hematocrit 39.0 - 52.0 % 50.5  Platelets 150 - 400 K/uL -     Assessment and plan- Patient is a 68 y.o. male with history of secondary polycythemia likely secondary to obstructive sleep apnea   1. Polycythemia, secondary   2. OSA (obstructive sleep apnea)   3. Iron deficiency anemia due to chronic blood loss    #Secondary polycythemia likely due to OSA. Today hematocrit is 50.5, patient will proceed with phlebotomy 300 cc. I discussed with patient that his erythrocytosis is secondary to OSA and this is a compensation process. Recommend phlebotomy if hematocrit is above 50.  # Iron deficiency secondary to multiple phlebotomy.  Continue to monitor. #OSA on CPAP machine.  Recommend continue to follow-up with primary care physician and pulmonology.  Orders Placed This Encounter  Procedures  . CBC with Differential/Platelet    Standing Status:   Future    Standing Expiration Date:   05/14/2020  Follow-up lab MD 3 months plus minus phlebotomy.   Earlie Server, MD, PhD Hematology Oncology [place] at [facility] Pager- [phone] 05/19/2019

## 2019-05-22 ENCOUNTER — Other Ambulatory Visit: Payer: Medicare HMO

## 2019-07-23 ENCOUNTER — Other Ambulatory Visit: Payer: Medicare HMO

## 2019-07-23 ENCOUNTER — Ambulatory Visit: Payer: Medicare HMO | Admitting: Oncology

## 2019-08-14 ENCOUNTER — Other Ambulatory Visit: Payer: Self-pay

## 2019-08-14 ENCOUNTER — Inpatient Hospital Stay: Payer: Medicare HMO | Attending: Oncology

## 2019-08-14 ENCOUNTER — Inpatient Hospital Stay: Payer: Medicare HMO

## 2019-08-14 ENCOUNTER — Inpatient Hospital Stay (HOSPITAL_BASED_OUTPATIENT_CLINIC_OR_DEPARTMENT_OTHER): Payer: Medicare HMO | Admitting: Oncology

## 2019-08-14 ENCOUNTER — Encounter: Payer: Self-pay | Admitting: Oncology

## 2019-08-14 VITALS — BP 118/80 | HR 90

## 2019-08-14 VITALS — BP 124/82 | HR 100 | Temp 97.7°F | Resp 18 | Wt 245.5 lb

## 2019-08-14 DIAGNOSIS — D751 Secondary polycythemia: Secondary | ICD-10-CM

## 2019-08-14 DIAGNOSIS — E119 Type 2 diabetes mellitus without complications: Secondary | ICD-10-CM | POA: Diagnosis not present

## 2019-08-14 DIAGNOSIS — G4733 Obstructive sleep apnea (adult) (pediatric): Secondary | ICD-10-CM

## 2019-08-14 DIAGNOSIS — Z79899 Other long term (current) drug therapy: Secondary | ICD-10-CM | POA: Insufficient documentation

## 2019-08-14 DIAGNOSIS — Z7982 Long term (current) use of aspirin: Secondary | ICD-10-CM | POA: Diagnosis not present

## 2019-08-14 DIAGNOSIS — G4730 Sleep apnea, unspecified: Secondary | ICD-10-CM | POA: Diagnosis not present

## 2019-08-14 DIAGNOSIS — Z7984 Long term (current) use of oral hypoglycemic drugs: Secondary | ICD-10-CM | POA: Diagnosis not present

## 2019-08-14 DIAGNOSIS — Z87442 Personal history of urinary calculi: Secondary | ICD-10-CM | POA: Diagnosis not present

## 2019-08-14 DIAGNOSIS — F419 Anxiety disorder, unspecified: Secondary | ICD-10-CM | POA: Insufficient documentation

## 2019-08-14 LAB — CBC WITH DIFFERENTIAL/PLATELET
Abs Immature Granulocytes: 0.03 10*3/uL (ref 0.00–0.07)
Basophils Absolute: 0 10*3/uL (ref 0.0–0.1)
Basophils Relative: 0 %
Eosinophils Absolute: 0.4 10*3/uL (ref 0.0–0.5)
Eosinophils Relative: 4 %
HCT: 50.8 % (ref 39.0–52.0)
Hemoglobin: 17.3 g/dL — ABNORMAL HIGH (ref 13.0–17.0)
Immature Granulocytes: 0 %
Lymphocytes Relative: 32 %
Lymphs Abs: 2.9 10*3/uL (ref 0.7–4.0)
MCH: 28 pg (ref 26.0–34.0)
MCHC: 34.1 g/dL (ref 30.0–36.0)
MCV: 82.3 fL (ref 80.0–100.0)
Monocytes Absolute: 0.8 10*3/uL (ref 0.1–1.0)
Monocytes Relative: 8 %
Neutro Abs: 5.1 10*3/uL (ref 1.7–7.7)
Neutrophils Relative %: 56 %
Platelets: 255 10*3/uL (ref 150–400)
RBC: 6.17 MIL/uL — ABNORMAL HIGH (ref 4.22–5.81)
RDW: 13.5 % (ref 11.5–15.5)
WBC: 9.3 10*3/uL (ref 4.0–10.5)
nRBC: 0 % (ref 0.0–0.2)

## 2019-08-14 NOTE — Progress Notes (Signed)
Pt in for follow up denies any concerns.  Reports MD added Ozempic to his medication regime.

## 2019-08-14 NOTE — Progress Notes (Signed)
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  BCR able and Jak 2 mutation testing was negative in 2011.  He does have a history of obstructive sleep apnea and uses CPAP.  He is [name] patient.  He was initially getting phlebotomy every 2 weeks but now gets it every 2 months if need be.  His threshold for phlebotomy is a hematocrit of greater than 47.  Patient does report improvement in his fatigue for a few days after he gets a phlebotomy   patient previously follows up with [name]. He switches care to me on 01/15/2019.  06/15/2016 JAK2 V617F negative, Exons 12-15 mutation negative.  06/15/2017 Erythropoietin 12.8.  05/15/2019 further testing including MPL, carl mutation which both are negative  Interval history-  68 y.o. male with a history of erythrocytosis present to follow-up. He reports doing well at baseline.  Uses CPAP machine for OSA.  Denies any pain shortness of breath, fever, chills, unintentional weight loss. Chronic fatigue at baseline.,  Denies any issues after last phlebotomy.   Review of systems- Review of Systems  Constitutional: Positive for malaise/fatigue. Negative for chills, fever and weight loss.  HENT: Negative for congestion, ear discharge, nosebleeds and sore throat.   Eyes: Negative for blurred vision and redness.  Respiratory: Negative for cough, hemoptysis, sputum production, shortness of breath and wheezing.   Cardiovascular: Negative for chest pain, palpitations, orthopnea, claudication and  leg swelling.  Gastrointestinal: Negative for abdominal pain, blood in stool, constipation, diarrhea, heartburn, melena, nausea and vomiting.  Genitourinary: Negative for dysuria, flank pain, frequency, hematuria and urgency.  Musculoskeletal: Negative for back pain, joint pain and myalgias.  Skin: Negative for rash.  Neurological: Negative for dizziness, tingling, tremors, focal weakness, seizures, weakness and headaches.  Endo/Heme/Allergies: Does not bruise/bleed easily.  Psychiatric/Behavioral: Negative for depression, hallucinations and suicidal ideas. The patient does not have insomnia.        Allergies  Allergen Reactions  . Sertraline Hcl Other (See Comments)    Vivid dreams     Past Medical History:  Diagnosis Date  . Anxiety   . Arthritis   . Diabetes mellitus without complication ([place])   . Erythrocytosis 05/18/2015  . Erythrocytosis   . History of kidney stones   . 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 Polycythemia   . Sleep apnea    USES CPAP     Past Surgical History:  Procedure Laterality Date  . BLADDER STONE REMOVAL    . cataracts Bilateral   . COLONOSCOPY N/A 06/02/2015   Procedure: COLONOSCOPY;  Surgeon: [name], MD;  Location: [facility] ENDOSCOPY;  Service: Endoscopy;  Laterality: N/A;  . CYSTOSCOPY W/ RETROGRADES Left 11/01/2017   Procedure: CYSTOSCOPY WITH RETROGRADE PYELOGRAM;  Surgeon: [name], MD;  Location: [facility] ORS;  Service: Urology;  Laterality: Left;  . CYSTOSCOPY WITH STENT PLACEMENT Left 11/01/2017   Procedure: CYSTOSCOPY WITH STENT PLACEMENT;  Surgeon: [name], MD;  Location: [facility] ORS;  Service: Urology;  Laterality: Left;  Social History   Socioeconomic History  . Marital status: Married    Spouse name: Not on file  . Number of children: Not on file  . Years of education: Not on file  . Highest education level: Not on file   Occupational History  . Not on file  Social Needs  . Financial resource strain: Not on file  . Food insecurity    Worry: Not on file    Inability: Not on file  . Transportation needs    Medical: Not on file    Non-medical: Not on file  Tobacco Use  . Smoking status: Never Smoker  . Smokeless tobacco: Never Used  Substance and Sexual Activity  . Alcohol use: No  . Drug use: No  . Sexual activity: Not on file  Lifestyle  . Physical activity    Days per week: Not on file    Minutes per session: Not on file  . Stress: Not on file  Relationships  . Social Herbalist on phone: Not on file    Gets together: Not on file    Attends religious service: Not on file    Active member of club or organization: Not on file    Attends meetings of clubs or organizations: Not on file    Relationship status: Not on file  . Intimate partner violence    Fear of current or ex partner: Not on file    Emotionally abused: Not on file    Physically abused: Not on file    Forced sexual activity: Not on file  Other Topics Concern  . Not on file  Social History Narrative  . Not on file    Family History  Problem Relation Age of Onset  . Heart disease Mother   . Diabetes Father      Current Outpatient Medications:  .  aspirin EC 81 MG tablet, Take 1 tablet (81 mg total) by mouth daily., Disp: 90 tablet, Rfl: 1 .  glipiZIDE (GLUCOTROL) 10 MG tablet, Take 10 mg by mouth 2 (two) times daily before a meal., Disp: , Rfl:  .  HYDROcodone-acetaminophen (NORCO/VICODIN) 5-325 MG tablet, Take 1 tablet by mouth 2 (two) times daily as needed for moderate pain. Reported on 06/15/2016, Disp: 30 tablet, Rfl: 0 .  insulin NPH Human (NOVOLIN N) 100 UNIT/ML injection, Inject into the skin. 20 units every evening, Disp: , Rfl:  .  lisinopril (PRINIVIL,ZESTRIL) 2.5 MG tablet, Take 2.5 mg by mouth at bedtime., Disp: , Rfl:  .  LORazepam (ATIVAN) 1 MG tablet, Take 1 tablet (1 mg total) by mouth daily.,  Disp: 15 tablet, Rfl: 0 .  metFORMIN (GLUCOPHAGE-XR) 500 MG 24 hr tablet, Take 1,000 mg by mouth 2 (two) times daily., Disp: , Rfl:  .  pioglitazone (ACTOS) 15 MG tablet, TK 1 T PO ONCE D, Disp: , Rfl:  .  pravastatin (PRAVACHOL) 10 MG tablet, Take 10 mg by mouth at bedtime. , Disp: , Rfl:  .  Semaglutide,0.25 or 0.5MG /DOS, 2 MG/1.5ML SOPN, Inject into the skin., Disp: , Rfl:  .  terbinafine (LAMISIL) 250 MG tablet, Take 250 mg by mouth at bedtime., Disp: , Rfl:   Current Facility-Administered Medications:  .  ciprofloxacin (CIPRO) tablet 500 mg, 500 mg, Oral, Once, Stoioff, Scott C, MD .  lidocaine (XYLOCAINE) 2 % jelly 1 application, 1 application, Urethral, Once, Stoioff, Ronda Fairly, MD  Physical exam: ECOG 1 Vitals:   08/14/19 1341  BP: 124/82  Pulse: 100  Resp: 18  Temp: 97.7 F (36.5 C)  TempSrc: Tympanic  SpO2: 95%  Weight: 245 lb 8 oz (111.4 kg)   Physical Exam Constitutional:      General: He is not in acute distress.    Appearance: Normal appearance. He is obese.  HENT:     Head: Normocephalic and atraumatic.  Eyes:     General: No scleral icterus.    Pupils: Pupils are equal, round, and reactive to light.  Neck:     Musculoskeletal: Normal range of motion and neck supple.  Cardiovascular:     Rate and Rhythm: Normal rate and regular rhythm.     Heart sounds: Normal heart sounds.  Pulmonary:     Effort: Pulmonary effort is normal. No respiratory distress.     Breath sounds: Normal breath sounds. No wheezing.  Abdominal:     General: Bowel sounds are normal. There is no distension.     Palpations: Abdomen is soft. There is no mass.     Tenderness: There is no abdominal tenderness.  Musculoskeletal: Normal range of motion.        General: No deformity.  Skin:    General: Skin is warm and dry.     Findings: No erythema or rash.  Neurological:     Mental Status: He is alert and oriented to person, place, and time.     Cranial Nerves: No cranial nerve deficit.      Coordination: Coordination normal.  Psychiatric:        Behavior: Behavior normal.        Thought Content: Thought content normal.      CMP Latest Ref Rng & Units 04/02/2019  Glucose 70 - 99 mg/dL 261(H)  BUN 8 - 23 mg/dL 16  Creatinine 0.61 - 1.24 mg/dL 1.15  Sodium 135 - 145 mmol/L 133(L)  Potassium 3.5 - 5.1 mmol/L 4.2  Chloride 98 - 111 mmol/L 102  CO2 22 - 32 mmol/L 20(L)  Calcium 8.9 - 10.3 mg/dL 9.6  Total Protein 6.5 - 8.1 g/dL 7.7  Total Bilirubin 0.3 - 1.2 mg/dL 0.7  Alkaline Phos 38 - 126 U/L 60  AST 15 - 41 U/L 22  ALT 0 - 44 U/L 25   CBC Latest Ref Rng & Units 08/14/2019  WBC 4.0 - 10.5 K/uL 9.3  Hemoglobin 13.0 - 17.0 g/dL 17.3(H)  Hematocrit 39.0 - 52.0 % 50.8  Platelets 150 - 400 K/uL 255     Assessment and plan- Patient is a 68 y.o. male with history of secondary polycythemia likely secondary to obstructive sleep apnea   1. Erythrocytosis   2. OSA (obstructive sleep apnea)    #Secondary polycythemia likely due to OSA. Labs are reviewed and discussed with patient.  Hemoglobin 17.3.  Hematocrit 50.8 Recommend to proceed with phlebotomy to keep hematocrit less than 50.  #OSA on CPAP machine.  Continue follow-up with primary care physician and pulmonology.  Orders Placed This Encounter  Procedures  . CBC with Differential/Platelet    Standing Status:   Future    Standing Expiration Date:   08/13/2020  Follow-up lab MD 3 months plus minus phlebotomy.  We spent sufficient time to discuss many aspect of care, questions were answered to patient's satisfaction. Total face to face encounter time for this patient visit was 15 min. >50% of the time was  spent in counseling and coordination of care.     Earlie Server, MD, PhD Hematology Oncology [facility] at [facility] Pager- [phone]  08/14/2019                

## 2019-09-05 ENCOUNTER — Other Ambulatory Visit: Payer: Self-pay

## 2019-09-05 ENCOUNTER — Encounter: Payer: Self-pay | Admitting: Emergency Medicine

## 2019-09-05 ENCOUNTER — Inpatient Hospital Stay
Admission: EM | Admit: 2019-09-05 | Discharge: 2019-09-06 | DRG: 872 | Disposition: A | Discharge status: Home / Self Care | Payer: Medicare HMO | Attending: Internal Medicine | Admitting: Internal Medicine

## 2019-09-05 ENCOUNTER — Emergency Department: Payer: Medicare HMO

## 2019-09-05 ENCOUNTER — Inpatient Hospital Stay: Payer: Medicare HMO

## 2019-09-05 DIAGNOSIS — G4733 Obstructive sleep apnea (adult) (pediatric): Secondary | ICD-10-CM | POA: Diagnosis present

## 2019-09-05 DIAGNOSIS — Z87440 Personal history of urinary (tract) infections: Secondary | ICD-10-CM | POA: Diagnosis not present

## 2019-09-05 DIAGNOSIS — E871 Hypo-osmolality and hyponatremia: Secondary | ICD-10-CM | POA: Diagnosis present

## 2019-09-05 DIAGNOSIS — Z888 Allergy status to other drugs, medicaments and biological substances status: Secondary | ICD-10-CM

## 2019-09-05 DIAGNOSIS — Z794 Long term (current) use of insulin: Secondary | ICD-10-CM | POA: Diagnosis not present

## 2019-09-05 DIAGNOSIS — Z20828 Contact with and (suspected) exposure to other viral communicable diseases: Secondary | ICD-10-CM | POA: Diagnosis present

## 2019-09-05 DIAGNOSIS — G4730 Sleep apnea, unspecified: Secondary | ICD-10-CM | POA: Diagnosis present

## 2019-09-05 DIAGNOSIS — Z87442 Personal history of urinary calculi: Secondary | ICD-10-CM

## 2019-09-05 DIAGNOSIS — A419 Sepsis, unspecified organism: Principal | ICD-10-CM | POA: Diagnosis present

## 2019-09-05 DIAGNOSIS — N410 Acute prostatitis: Secondary | ICD-10-CM | POA: Diagnosis present

## 2019-09-05 DIAGNOSIS — Z7982 Long term (current) use of aspirin: Secondary | ICD-10-CM | POA: Diagnosis not present

## 2019-09-05 DIAGNOSIS — N390 Urinary tract infection, site not specified: Secondary | ICD-10-CM | POA: Diagnosis present

## 2019-09-05 DIAGNOSIS — R6520 Severe sepsis without septic shock: Secondary | ICD-10-CM | POA: Diagnosis present

## 2019-09-05 DIAGNOSIS — Z79899 Other long term (current) drug therapy: Secondary | ICD-10-CM | POA: Diagnosis not present

## 2019-09-05 DIAGNOSIS — Z833 Family history of diabetes mellitus: Secondary | ICD-10-CM | POA: Diagnosis not present

## 2019-09-05 DIAGNOSIS — E119 Type 2 diabetes mellitus without complications: Secondary | ICD-10-CM | POA: Diagnosis present

## 2019-09-05 DIAGNOSIS — E860 Dehydration: Secondary | ICD-10-CM | POA: Diagnosis present

## 2019-09-05 DIAGNOSIS — F419 Anxiety disorder, unspecified: Secondary | ICD-10-CM | POA: Diagnosis present

## 2019-09-05 DIAGNOSIS — Z8249 Family history of ischemic heart disease and other diseases of the circulatory system: Secondary | ICD-10-CM

## 2019-09-05 LAB — SARS CORONAVIRUS 2 BY RT PCR (HOSPITAL ORDER, PERFORMED IN ~~LOC~~ HOSPITAL LAB): SARS Coronavirus 2: NEGATIVE

## 2019-09-05 LAB — GLUCOSE, CAPILLARY
Glucose-Capillary: 148 mg/dL — ABNORMAL HIGH (ref 70–99)
Glucose-Capillary: 149 mg/dL — ABNORMAL HIGH (ref 70–99)

## 2019-09-05 LAB — CBC WITH DIFFERENTIAL/PLATELET
Abs Immature Granulocytes: 0.2 10*3/uL — ABNORMAL HIGH (ref 0.00–0.07)
Basophils Absolute: 0.1 10*3/uL (ref 0.0–0.1)
Basophils Relative: 0 %
Eosinophils Absolute: 0 10*3/uL (ref 0.0–0.5)
Eosinophils Relative: 0 %
HCT: 50.6 % (ref 39.0–52.0)
Hemoglobin: 17.5 g/dL — ABNORMAL HIGH (ref 13.0–17.0)
Immature Granulocytes: 1 %
Lymphocytes Relative: 5 %
Lymphs Abs: 1.1 10*3/uL (ref 0.7–4.0)
MCH: 28 pg (ref 26.0–34.0)
MCHC: 34.6 g/dL (ref 30.0–36.0)
MCV: 81.1 fL (ref 80.0–100.0)
Monocytes Absolute: 1 10*3/uL (ref 0.1–1.0)
Monocytes Relative: 4 %
Neutro Abs: 22.7 10*3/uL — ABNORMAL HIGH (ref 1.7–7.7)
Neutrophils Relative %: 90 %
Platelets: 237 10*3/uL (ref 150–400)
RBC: 6.24 MIL/uL — ABNORMAL HIGH (ref 4.22–5.81)
RDW: 13.6 % (ref 11.5–15.5)
WBC: 25 10*3/uL — ABNORMAL HIGH (ref 4.0–10.5)
nRBC: 0 % (ref 0.0–0.2)

## 2019-09-05 LAB — PROTIME-INR
INR: 1.2 (ref 0.8–1.2)
Prothrombin Time: 14.8 seconds (ref 11.4–15.2)

## 2019-09-05 LAB — URINALYSIS, COMPLETE (UACMP) WITH MICROSCOPIC
Bilirubin Urine: NEGATIVE
Glucose, UA: 150 mg/dL — AB
Ketones, ur: NEGATIVE mg/dL
Nitrite: NEGATIVE
Protein, ur: NEGATIVE mg/dL
Specific Gravity, Urine: 1.011 (ref 1.005–1.030)
pH: 5 (ref 5.0–8.0)

## 2019-09-05 LAB — LACTIC ACID, PLASMA
Lactic Acid, Venous: 3.9 mmol/L (ref 0.5–1.9)
Lactic Acid, Venous: 4 mmol/L (ref 0.5–1.9)
Lactic Acid, Venous: 3.7 mmol/L (ref 0.5–1.9)

## 2019-09-05 LAB — COMPREHENSIVE METABOLIC PANEL
ALT: 20 U/L (ref 0–44)
AST: 18 U/L (ref 15–41)
Albumin: 4.3 g/dL (ref 3.5–5.0)
Alkaline Phosphatase: 58 U/L (ref 38–126)
Anion gap: 16 — ABNORMAL HIGH (ref 5–15)
BUN: 15 mg/dL (ref 8–23)
CO2: 19 mmol/L — ABNORMAL LOW (ref 22–32)
Calcium: 9.8 mg/dL (ref 8.9–10.3)
Chloride: 95 mmol/L — ABNORMAL LOW (ref 98–111)
Creatinine, Ser: 1.09 mg/dL (ref 0.61–1.24)
GFR calc Af Amer: >60 mL/min (ref >60)
GFR calc non Af Amer: >60 mL/min (ref >60)
Glucose, Bld: 262 mg/dL — ABNORMAL HIGH (ref 70–99)
Potassium: 4.3 mmol/L (ref 3.5–5.1)
Sodium: 130 mmol/L — ABNORMAL LOW (ref 135–145)
Total Bilirubin: 1.4 mg/dL — ABNORMAL HIGH (ref 0.3–1.2)
Total Protein: 8 g/dL (ref 6.5–8.1)

## 2019-09-05 MED ORDER — INSULIN NPH (HUMAN) (ISOPHANE) 100 UNIT/ML ~~LOC~~ SUSP
15.0000 [IU] | Freq: Every day | SUBCUTANEOUS | Status: DC
  Filled 2019-09-05: qty 0.15
  Filled 2019-09-05: qty 10

## 2019-09-05 MED ORDER — LORAZEPAM 1 MG PO TABS
1.0000 mg | ORAL_TABLET | Freq: Two times a day (BID) | ORAL | Status: DC
  Administered 2019-09-05 – 2019-09-06 (×2): 1 mg via ORAL
  Filled 2019-09-05 (×2): qty 1

## 2019-09-05 MED ORDER — SODIUM CHLORIDE 0.9 % IV BOLUS (SEPSIS)
1000.0000 mL | Freq: Once | INTRAVENOUS | Status: AC
  Administered 2019-09-05: 1000 mL via INTRAVENOUS

## 2019-09-05 MED ORDER — ACETAMINOPHEN 325 MG PO TABS
650.0000 mg | ORAL_TABLET | Freq: Four times a day (QID) | ORAL | Status: DC | PRN
  Administered 2019-09-06 (×2): 650 mg via ORAL
  Filled 2019-09-05 (×2): qty 2

## 2019-09-05 MED ORDER — METRONIDAZOLE IN NACL 5-0.79 MG/ML-% IV SOLN
500.0000 mg | Freq: Once | INTRAVENOUS | Status: AC
  Administered 2019-09-05: 500 mg via INTRAVENOUS
  Filled 2019-09-05: qty 100

## 2019-09-05 MED ORDER — SODIUM CHLORIDE 0.9 % IV SOLN
INTRAVENOUS | Status: DC
  Administered 2019-09-05 – 2019-09-06 (×3): via INTRAVENOUS

## 2019-09-05 MED ORDER — HYDROCODONE-ACETAMINOPHEN 5-325 MG PO TABS
1.0000 | ORAL_TABLET | Freq: Four times a day (QID) | ORAL | Status: DC | PRN
  Administered 2019-09-05: 1 via ORAL
  Filled 2019-09-05: qty 1

## 2019-09-05 MED ORDER — ACETAMINOPHEN 325 MG PO TABS
ORAL_TABLET | ORAL | Status: AC
  Administered 2019-09-05: 650 mg
  Filled 2019-09-05: qty 2

## 2019-09-05 MED ORDER — INSULIN ASPART 100 UNIT/ML ~~LOC~~ SOLN
0.0000 [IU] | Freq: Three times a day (TID) | SUBCUTANEOUS | Status: DC
  Administered 2019-09-06: 3 [IU] via SUBCUTANEOUS
  Administered 2019-09-06: 2 [IU] via SUBCUTANEOUS
  Filled 2019-09-05 (×2): qty 1

## 2019-09-05 MED ORDER — ONDANSETRON HCL 4 MG PO TABS: 4.0000 mg | ORAL_TABLET | Freq: Four times a day (QID) | ORAL | Status: DC | PRN

## 2019-09-05 MED ORDER — SODIUM CHLORIDE 0.9 % IV BOLUS
1000.0000 mL | Freq: Once | INTRAVENOUS | Status: AC
  Administered 2019-09-05: 1000 mL via INTRAVENOUS

## 2019-09-05 MED ORDER — LISINOPRIL 5 MG PO TABS
2.5000 mg | ORAL_TABLET | Freq: Every day | ORAL | Status: DC
  Filled 2019-09-05: qty 1

## 2019-09-05 MED ORDER — ONDANSETRON HCL 4 MG/2ML IJ SOLN: 4.0000 mg | Freq: Four times a day (QID) | INTRAMUSCULAR | Status: DC | PRN

## 2019-09-05 MED ORDER — POLYETHYLENE GLYCOL 3350 17 G PO PACK: 17.0000 g | PACK | Freq: Every day | ORAL | Status: DC | PRN

## 2019-09-05 MED ORDER — SODIUM CHLORIDE 0.9 % IV SOLN
2.0000 g | INTRAVENOUS | Status: DC
  Administered 2019-09-05: 2 g via INTRAVENOUS
  Filled 2019-09-05: qty 20

## 2019-09-05 MED ORDER — INSULIN ASPART 100 UNIT/ML ~~LOC~~ SOLN: 0.0000 [IU] | Freq: Every day | SUBCUTANEOUS | Status: DC

## 2019-09-05 MED ORDER — SODIUM CHLORIDE 0.9 % IV SOLN
2.0000 g | Freq: Once | INTRAVENOUS | Status: AC
  Administered 2019-09-05: 2 g via INTRAVENOUS
  Filled 2019-09-05: qty 2

## 2019-09-05 MED ORDER — SODIUM CHLORIDE 0.9 % IV BOLUS
1000.0000 mL | Freq: Once | INTRAVENOUS | Status: AC
  Administered 2019-09-05: 20:00:00 1000 mL via INTRAVENOUS

## 2019-09-05 MED ORDER — SODIUM CHLORIDE 0.9 % IV SOLN
1000.0000 mL | Freq: Once | INTRAVENOUS | Status: AC
  Administered 2019-09-05: 1000 mL via INTRAVENOUS

## 2019-09-05 MED ORDER — PRAVASTATIN SODIUM 10 MG PO TABS
10.0000 mg | ORAL_TABLET | Freq: Every day | ORAL | Status: DC
  Administered 2019-09-05: 10 mg via ORAL
  Filled 2019-09-05 (×2): qty 1

## 2019-09-05 MED ORDER — SODIUM CHLORIDE 0.9 % IV BOLUS (SEPSIS)
500.0000 mL | Freq: Once | INTRAVENOUS | Status: AC
  Administered 2019-09-05: 17:00:00 500 mL via INTRAVENOUS

## 2019-09-05 MED ORDER — ACETAMINOPHEN 500 MG PO TABS
1000.0000 mg | ORAL_TABLET | Freq: Once | ORAL | Status: AC
  Administered 2019-09-05: 1000 mg via ORAL
  Filled 2019-09-05: qty 2

## 2019-09-05 MED ORDER — ENOXAPARIN SODIUM 40 MG/0.4ML ~~LOC~~ SOLN
40.0000 mg | SUBCUTANEOUS | Status: DC
  Administered 2019-09-06: 03:00:00 40 mg via SUBCUTANEOUS
  Filled 2019-09-05: qty 0.4

## 2019-09-05 MED ORDER — PIOGLITAZONE HCL 15 MG PO TABS: 15.0000 mg | ORAL_TABLET | Freq: Every day | ORAL | Status: DC

## 2019-09-05 MED ORDER — ACETAMINOPHEN 650 MG RE SUPP: 650.0000 mg | Freq: Four times a day (QID) | RECTAL | Status: DC | PRN

## 2019-09-05 MED ORDER — VANCOMYCIN HCL IN DEXTROSE 1-5 GM/200ML-% IV SOLN
1000.0000 mg | Freq: Once | INTRAVENOUS | Status: AC
  Administered 2019-09-05: 1000 mg via INTRAVENOUS
  Filled 2019-09-05: qty 200

## 2019-09-05 MED ORDER — SODIUM CHLORIDE 0.9% FLUSH
3.0000 mL | Freq: Two times a day (BID) | INTRAVENOUS | Status: DC
  Administered 2019-09-05 – 2019-09-06 (×2): 3 mL via INTRAVENOUS

## 2019-09-05 NOTE — ED Provider Notes (Addendum)
[facility] Emergency Department Provider Note   ____________________________________________    I have reviewed the triage vital signs and the nursing notes.   HISTORY  Chief Complaint Fever     HPI Benjamin [name] is a 68 y.o. male with history of diabetes, polycythemia who presents with complaints of fever, chills, nausea.  Sent from urgent care for further evaluation.  Patient denies cough or shortness of breath.  Does report difficulty urinating which is been ongoing for about 1 to 2 days.  Has a history of hypertrophy of the prostate.  Denies back pain.  No rash.  Has not take anything for this.  Past Medical History:  Diagnosis Date  . Anxiety   . Arthritis   . Diabetes mellitus without complication ([place])   . Erythrocytosis 05/18/2015  . Erythrocytosis   . History of kidney stones   . 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 Polycythemia   . Sleep apnea    USES CPAP    Patient Active Problem List   Diagnosis Date Noted  . 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 Nephrolithiasis 12/14/2017  . Erythrocytosis 05/18/2015    Past Surgical History:  Procedure Laterality Date  . BLADDER STONE REMOVAL    . cataracts Bilateral   . COLONOSCOPY N/A 06/02/2015   Procedure: COLONOSCOPY;  Surgeon: [name], MD;  Location: [facility] ENDOSCOPY;  Service: Endoscopy;  Laterality: N/A;  . CYSTOSCOPY W/ RETROGRADES Left 11/01/2017   Procedure: CYSTOSCOPY WITH RETROGRADE PYELOGRAM;  Surgeon: [name], MD;  Location: [facility] ORS;  Service: Urology;  Laterality: Left;  . CYSTOSCOPY WITH STENT PLACEMENT Left 11/01/2017   Procedure: CYSTOSCOPY WITH STENT PLACEMENT;  Surgeon: [name], MD;  Location: [facility] ORS;  Service: Urology;  Laterality: Left;    Prior to Admission medications   Medication Sig Start Date End Date Taking? Authorizing  Provider  aspirin EC 81 MG tablet Take 1 tablet (81 mg total) by mouth daily. 04/17/19   Earlie Server, MD  glipiZIDE (GLUCOTROL) 10 MG tablet Take 10 mg by mouth 2 (two) times daily before a meal.    [provider]  HYDROcodone-acetaminophen (NORCO/VICODIN) 5-325 MG tablet Take 1 tablet by mouth 2 (two) times daily as needed for moderate pain. Reported on 06/15/2016 10/24/17   Zara Council A, PA-C  insulin NPH Human (NOVOLIN N) 100 UNIT/ML injection Inject into the skin. 20 units every evening 02/12/19   [provider]  lisinopril (PRINIVIL,ZESTRIL) 2.5 MG tablet Take 2.5 mg by mouth at bedtime.    [provider]  LORazepam (ATIVAN) 1 MG tablet Take 1 tablet (1 mg total) by mouth daily. 10/20/15   Forest Gleason, MD  metFORMIN (GLUCOPHAGE-XR) 500 MG 24 hr tablet Take 1,000 mg by mouth 2 (two) times daily.    [provider]  pioglitazone (ACTOS) 15 MG tablet TK 1 T PO ONCE D 12/28/18   [provider]  pravastatin (PRAVACHOL) 10 MG tablet Take 10 mg by mouth at bedtime.     [provider]  Semaglutide,0.25 or 0.5MG /DOS, 2 MG/1.5ML SOPN Inject into the skin. 05/15/19   [provider]  terbinafine (LAMISIL) 250 MG tablet Take 250 mg by mouth at bedtime.    [provider]     Allergies Sertraline hcl  Family History  Problem Relation Age of Onset  . Heart disease Mother   . Diabetes Father  Social History Social History   Tobacco Use  . Smoking status: Never Smoker  . Smokeless tobacco: Never Used  Substance Use Topics  . Alcohol use: No  . Drug use: No    Review of Systems  Constitutional: Positive fever Eyes: No visual changes.  ENT: No sore throat. Cardiovascular: Denies chest pain. Respiratory: Denies shortness of breath. Gastrointestinal: As above Genitourinary: Difficulty urinating. Musculoskeletal: Negative for back pain. Skin: Negative for rash. Neurological: Negative for  headache  ____________________________________________   PHYSICAL EXAM:  VITAL SIGNS: ED Triage Vitals  Enc Vitals Group     BP 09/05/19 1226 118/79     Pulse Rate 09/05/19 1226 (!) 134     Resp 09/05/19 1226 (!) 22     Temp 09/05/19 1226 (!) 102.6 F (39.2 C)     Temp src --      SpO2 09/05/19 1226 99 %     Weight 09/05/19 1227 108.9 kg (240 lb)     Height 09/05/19 1227 1.702 m (5\' 7" )     Head Circumference --      Peak Flow --      Pain Score 09/05/19 1442 0     Pain Loc --      Pain Edu? --      Excl. in [place]? --     Constitutional: Alert and oriented.  Eyes: Conjunctivae are normal.   Nose: No congestion/rhinnorhea. Mouth/Throat: Mucous membranes are moist.    Cardiovascular: Normal rate, regular rhythm.   Good peripheral circulation. Respiratory: Normal respiratory effort.  No retractions. Lungs CTAB. Gastrointestinal: Soft and nontender. No distention.  No CVA tenderness.  Musculoskeletal: .  Warm and well perfused Neurologic:  Normal speech and language. No gross focal neurologic deficits are appreciated.  Skin:  Skin is warm, dry and intact. No rash noted. Psychiatric: Mood and affect are normal. Speech and behavior are normal.  ____________________________________________   LABS (all labs ordered are listed, but only abnormal results are displayed)  Labs Reviewed  COMPREHENSIVE METABOLIC PANEL - Abnormal; Notable for the following components:      Result Value   Sodium 130 (*)    Chloride 95 (*)    CO2 19 (*)    Glucose, Bld 262 (*)    Total Bilirubin 1.4 (*)    Anion gap 16 (*)    All other components within normal limits  LACTIC ACID, PLASMA - Abnormal; Notable for the following components:   Lactic Acid, Venous 3.9 (*)    All other components within normal limits  LACTIC ACID, PLASMA - Abnormal; Notable for the following components:   Lactic Acid, Venous 4.0 (*)    All other components within normal limits  CBC WITH DIFFERENTIAL/PLATELET -  Abnormal; Notable for the following components:   WBC 25.0 (*)    RBC 6.24 (*)    Hemoglobin 17.5 (*)    Neutro Abs 22.7 (*)    Abs Immature Granulocytes 0.20 (*)    All other components within normal limits  URINALYSIS, COMPLETE (UACMP) WITH MICROSCOPIC - Abnormal; Notable for the following components:   Color, Urine YELLOW (*)    APPearance CLEAR (*)    Glucose, UA 150 (*)    Hgb urine dipstick SMALL (*)    Leukocytes,Ua SMALL (*)    Bacteria, UA RARE (*)    All other components within normal limits  SARS CORONAVIRUS 2 (HOSPITAL ORDER, [place] [facility])  CULTURE, BLOOD (ROUTINE X 2)  CULTURE, BLOOD (ROUTINE X  2)  URINE CULTURE  PROTIME-INR   ____________________________________________  EKG  ED ECG REPORT I, Lavonia Drafts, the attending physician, personally viewed and interpreted this ECG.  Date: 09/05/2019  Rhythm: Sinus tachycardia QRS Axis: normal Intervals: Abnormal ST/T Wave abnormalities: Nonspecific changes Narrative Interpretation: no evidence of acute ischemia  ____________________________________________  RADIOLOGY  Chest x-ray unremarkable ____________________________________________   PROCEDURES  Procedure(s) performed: No  Procedures   Critical Care performed: yes  CRITICAL CARE Performed by: Lavonia Drafts   Total critical care time: 30 minutes  Critical care time was exclusive of separately billable procedures and treating other patients.  Critical care was necessary to treat or prevent imminent or life-threatening deterioration.  Critical care was time spent personally by me on the following activities: development of treatment plan with patient and/or surrogate as well as nursing, discussions with consultants, evaluation of patient's response to treatment, examination of patient, obtaining history from patient or surrogate, ordering and performing treatments and interventions, ordering and review of laboratory  studies, ordering and review of radiographic studies, pulse oximetry and re-evaluation of patient's condition.  ____________________________________________   INITIAL IMPRESSION / ASSESSMENT AND PLAN / ED COURSE  Pertinent labs & imaging results that were available during my care of the patient were reviewed by me and considered in my medical decision making (see chart for details).  Patient presents with fever, tachycardia, concerning for sepsis.  Given his description of difficulty urinating this may be the source.  Will start IV fluids, pending labs.  Lab work is significant for elevated lactic acid of 3.9 as well as white blood cell count of 25, consistent with sepsis/severe sepsis.  30 mils per kilogram of IV fluids ordered, broad-spectrum antibiotics ordered IV vancomycin and IV cefepime  Coronavirus swab sent, chest x-ray is clear, strongly suspect urine is the source given his description of difficulty urinating, history of prostatic hypertrophy  Sepsis - Repeat Assessment  Performed at:    5pm  Vitals     Blood pressure 103/86, pulse (!) 118, temperature 99.4 F (37.4 C), resp. rate (!) 23, height 1.702 m (5\' 7" ), weight 111.1 kg, SpO2 96 %.  Heart:     Regular rate and rhythm and Tachycardic  Lungs:    CTA  Capillary Refill:   <2 sec  Peripheral Pulse:   Radial pulse palpable  Skin:     Normal Color       ____________________________________________   FINAL CLINICAL IMPRESSION(S) / ED DIAGNOSES  Final diagnoses:  Severe sepsis ([place])        Note:  This document was prepared using Dragon voice recognition software and may include unintentional dictation errors.   Lavonia Drafts, MD 09/05/19 1719    Lavonia Drafts, MD 09/05/19 [phone]

## 2019-09-05 NOTE — ED Notes (Signed)
Pt cleaned up and repositioned in the bed. Pt resting comfortably at this time

## 2019-09-05 NOTE — ED Notes (Signed)
Pt's eyes red with green discharge. PT's eyes cleaned with cold washcloth. Pt states he has allergies and forgot his eye drops. Will request with pharmacy.

## 2019-09-05 NOTE — ED Notes (Signed)
Patient transported to CT 

## 2019-09-05 NOTE — ED Notes (Signed)
Pt has had shaking fits throughout the day. Pt states he started shaking yesterday. Pt BS checked. BS WNL. Pt eating at this time. Pt denies any pain.

## 2019-09-05 NOTE — Sepsis Progress Note (Signed)
Notified bedside nurse of need to draw repeat lactic acid after fluid resusitation is completed. 

## 2019-09-05 NOTE — ED Notes (Signed)
Benjamin Lose RN from 1C called concerned with pt's MEWS score. RN Charge informed, Vitals rechecked and repeat EKG performed. Admitting MD informed of concerns from floor and states will change to 2A floor. Pt informed of bed status.

## 2019-09-05 NOTE — ED Notes (Signed)
RN entered room. Pt asleep at this time. Respirations even and unlabored.

## 2019-09-05 NOTE — ED Notes (Signed)
ED TO INPATIENT HANDOFF REPORT  ED Nurse Name and Phone #: Mac [phone]  S Name/Age/Gender Benjamin [name] 68 y.o. male Room/Bed: ED14A/ED14A  Code Status   Code Status: Full Code  Home/SNF/Other Home Patient oriented to: self, place, time and situation Is this baseline? Yes   Triage Complete: Triage complete  Chief Complaint sent by doctor  Triage Note Pt sent from [facility] for fever, elevated WBC and tachycardia without source.   Allergies Allergies  Allergen Reactions  . Sertraline Hcl Other (See Comments)    Vivid dreams    Level of Care/Admitting Diagnosis ED Disposition    ED Disposition Condition [place] [facility]: [place] [[id number]]  Level of Care: Med-Surg [16]  Covid Evaluation: Asymptomatic Screening Protocol (No Symptoms)  Diagnosis: UTI (urinary tract infection) [875643]  Admitting Physician: Hillary Bow [[id number]]  Attending Physician: Hillary Bow [[id number]]  Estimated length of stay: past midnight tomorrow  Certification:: I certify this patient will need inpatient services for at least 2 midnights  PT Class (Do Not Modify): Inpatient [101]  PT Acc Code (Do Not Modify): Private [1]       B Medical/Surgery History Past Medical History:  Diagnosis Date  . Anxiety   . Arthritis   . Diabetes mellitus without complication ([facility])   . Erythrocytosis 05/18/2015  . Erythrocytosis   . History of kidney stones   . 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 Polycythemia   . Sleep apnea    USES CPAP   Past Surgical History:  Procedure Laterality Date  . BLADDER STONE REMOVAL    . cataracts Bilateral   . COLONOSCOPY N/A 06/02/2015   Procedure: COLONOSCOPY;  Surgeon: [name], MD;  Location: [facility] ENDOSCOPY;  Service: Endoscopy;  Laterality: N/A;  . CYSTOSCOPY W/ RETROGRADES Left 11/01/2017   Procedure: CYSTOSCOPY WITH RETROGRADE  PYELOGRAM;  Surgeon: [name], MD;  Location: [facility] ORS;  Service: Urology;  Laterality: Left;  . CYSTOSCOPY WITH STENT PLACEMENT Left 11/01/2017   Procedure: CYSTOSCOPY WITH STENT PLACEMENT;  Surgeon: [name], MD;  Location: [facility] ORS;  Service: Urology;  Laterality: Left;     A IV Location/Drains/Wounds Patient Lines/Drains/Airways Status   Active Line/Drains/Airways    Name:   Placement date:   Placement time:   Site:   Days:   Peripheral IV 09/05/19 Left Antecubital   09/05/19    1240    Antecubital   less than 1   Peripheral IV 09/05/19 Right Forearm   09/05/19    1454    Forearm   less than 1   Ureteral Drain/Stent Left ureter 6 Fr.   11/01/17    1101    Left ureter   673   Incision (Closed) 11/01/17 Penis Other (Comment)   11/01/17    1057     673          Intake/Output Last 24 hours  Intake/Output Summary (Last 24 hours) at 09/05/2019 1936 Last data filed at 09/05/2019 1845 Gross per 24 hour  Intake 3600 ml  Output 1000 ml  Net 2600 ml    Labs/Imaging Results for orders placed or performed during the hospital encounter of 09/05/19 (from the past 48 hour(s))  Urinalysis, Complete w Microscopic     Status: Abnormal   Collection Time: 09/05/19 12:29 PM  Result Value Ref Range   Color, Urine YELLOW (A) YELLOW   APPearance  CLEAR (A) CLEAR   Specific Gravity, Urine 1.011 1.005 - 1.030   pH 5.0 5.0 - 8.0   Glucose, UA 150 (A) NEGATIVE mg/dL   Hgb urine dipstick SMALL (A) NEGATIVE   Bilirubin Urine NEGATIVE NEGATIVE   Ketones, ur NEGATIVE NEGATIVE mg/dL   Protein, ur NEGATIVE NEGATIVE mg/dL   Nitrite NEGATIVE NEGATIVE   Leukocytes,Ua SMALL (A) NEGATIVE   RBC / HPF 0-5 0 - 5 RBC/hpf   WBC, UA 6-10 0 - 5 WBC/hpf   Bacteria, UA RARE (A) NONE SEEN   Squamous Epithelial / LPF 0-5 0 - 5   Mucus PRESENT     Comment: Performed at [facility], [address] [place], [place] [address]  Comprehensive metabolic panel     Status: Abnormal   Collection  Time: 09/05/19 12:46 PM  Result Value Ref Range   Sodium 130 (L) 135 - 145 mmol/L   Potassium 4.3 3.5 - 5.1 mmol/L   Chloride 95 (L) 98 - 111 mmol/L   CO2 19 (L) 22 - 32 mmol/L   Glucose, Bld 262 (H) 70 - 99 mg/dL   BUN 15 8 - 23 mg/dL   Creatinine, Ser 1.09 0.61 - 1.24 mg/dL   Calcium 9.8 8.9 - 10.3 mg/dL   Total Protein 8.0 6.5 - 8.1 g/dL   Albumin 4.3 3.5 - 5.0 g/dL   AST 18 15 - 41 U/L   ALT 20 0 - 44 U/L   Alkaline Phosphatase 58 38 - 126 U/L   Total Bilirubin 1.4 (H) 0.3 - 1.2 mg/dL   GFR calc non Af Amer >60 >60 mL/min   GFR calc Af Amer >60 >60 mL/min   Anion gap 16 (H) 5 - 15    Comment: Performed at [facility], [address] [place], [place] [address]  Lactic acid, plasma     Status: Abnormal   Collection Time: 09/05/19 12:46 PM  Result Value Ref Range   Lactic Acid, Venous 3.9 (HH) 0.5 - 1.9 mmol/L    Comment: CRITICAL RESULT CALLED TO, READ BACK BY AND VERIFIED WITH STEPHANIE RUDD 09/05/19 1339 KLW Performed at [facility], [address] [place], [place] [address]   CBC with Differential     Status: Abnormal   Collection Time: 09/05/19 12:46 PM  Result Value Ref Range   WBC 25.0 (H) 4.0 - 10.5 K/uL   RBC 6.24 (H) 4.22 - 5.81 MIL/uL   Hemoglobin 17.5 (H) 13.0 - 17.0 g/dL   HCT 50.6 39.0 - 52.0 %   MCV 81.1 80.0 - 100.0 fL   MCH 28.0 26.0 - 34.0 pg   MCHC 34.6 30.0 - 36.0 g/dL   RDW 13.6 11.5 - 15.5 %   Platelets 237 150 - 400 K/uL   nRBC 0.0 0.0 - 0.2 %   Neutrophils Relative % 90 %   Neutro Abs 22.7 (H) 1.7 - 7.7 K/uL   Lymphocytes Relative 5 %   Lymphs Abs 1.1 0.7 - 4.0 K/uL   Monocytes Relative 4 %   Monocytes Absolute 1.0 0.1 - 1.0 K/uL   Eosinophils Relative 0 %   Eosinophils Absolute 0.0 0.0 - 0.5 K/uL   Basophils Relative 0 %   Basophils Absolute 0.1 0.0 - 0.1 K/uL   Immature Granulocytes 1 %   Abs Immature Granulocytes 0.20 (H) 0.00 - 0.07 K/uL    Comment: Performed at [facility], [address],  [place], [place] [address]  Protime-INR  Status: None   Collection Time: 09/05/19 12:46 PM  Result Value Ref Range   Prothrombin Time 14.8 11.4 - 15.2 seconds   INR 1.2 0.8 - 1.2    Comment: (NOTE) INR goal varies based on device and disease states. Performed at [facility], [address] [place], [place] [address]   Lactic acid, plasma     Status: Abnormal   Collection Time: 09/05/19  2:34 PM  Result Value Ref Range   Lactic Acid, Venous 4.0 (HH) 0.5 - 1.9 mmol/L    Comment: CRITICAL VALUE NOTED. VALUE IS CONSISTENT WITH PREVIOUSLY REPORTED/CALLED VALUE KLW Performed at [facility], [address] [place], [place] [address]   SARS Coronavirus 2 [facility] order, Performed in [facility] hospital lab) Nasopharyngeal Nasopharyngeal Swab     Status: None   Collection Time: 09/05/19  2:34 PM   Specimen: Nasopharyngeal Swab  Result Value Ref Range   SARS Coronavirus 2 NEGATIVE NEGATIVE    Comment: (NOTE) If result is NEGATIVE SARS-CoV-2 target nucleic acids are NOT DETECTED. The SARS-CoV-2 RNA is generally detectable in upper and lower  respiratory specimens during the acute phase of infection. The lowest  concentration of SARS-CoV-2 viral copies this assay can detect is 250  copies / mL. A negative result does not preclude SARS-CoV-2 infection  and should not be used as the sole basis for treatment or other  patient management decisions.  A negative result may occur with  improper specimen collection / handling, submission of specimen other  than nasopharyngeal swab, presence of viral mutation(s) within the  areas targeted by this assay, and inadequate number of viral copies  (<250 copies / mL). A negative result must be combined with clinical  observations, patient history, and epidemiological information. If result is POSITIVE SARS-CoV-2 target nucleic acids are DETECTED. The SARS-CoV-2 RNA is generally detectable in upper and lower  respiratory specimens  dur ing the acute phase of infection.  Positive  results are indicative of active infection with SARS-CoV-2.  Clinical  correlation with patient history and other diagnostic information is  necessary to determine patient infection status.  Positive results do  not rule out bacterial infection or co-infection with other viruses. If result is PRESUMPTIVE POSTIVE SARS-CoV-2 nucleic acids MAY BE PRESENT.   A presumptive positive result was obtained on the submitted specimen  and confirmed on repeat testing.  While 2019 novel coronavirus  (SARS-CoV-2) nucleic acids may be present in the submitted sample  additional confirmatory testing may be necessary for epidemiological  and / or clinical management purposes  to differentiate between  SARS-CoV-2 and other Sarbecovirus currently known to infect humans.  If clinically indicated additional testing with an alternate test  methodology [phone]) is advised. The SARS-CoV-2 RNA is generally  detectable in upper and lower respiratory sp ecimens during the acute  phase of infection. The expected result is Negative. Fact Sheet for Patients:  StrictlyIdeas.no Fact Sheet for Healthcare Providers: BankingDealers.co.za This test is not yet approved or cleared by the [place] FDA and has been authorized for detection and/or diagnosis of SARS-CoV-2 by FDA under an Emergency Use Authorization (EUA).  This EUA will remain in effect (meaning this test can be used) for the duration of the COVID-19 declaration under Section 564(b)(1) of the Act, 21 U.S.C. section 360bbb-3(b)(1), unless the authorization is terminated or revoked sooner. Performed at [facility], [address] [place], [place] [address]   Glucose, capillary     Status: Abnormal  Collection Time: 09/05/19  5:34 PM  Result Value Ref Range   Glucose-Capillary 148 (H) 70 - 99 mg/dL  Lactic acid, plasma     Status: Abnormal    Collection Time: 09/05/19  6:47 PM  Result Value Ref Range   Lactic Acid, Venous 3.7 (HH) 0.5 - 1.9 mmol/L    Comment: CRITICAL VALUE NOTED. VALUE IS CONSISTENT WITH PREVIOUSLY REPORTED/CALLED VALUE KLW Performed at [facility], [address] [place], [place] [address]    Dg Chest 2 View  Result Date: 09/05/2019 CLINICAL DATA:  Elevated white count with tachycardia EXAM: CHEST - 2 VIEW COMPARISON:  None. FINDINGS: The heart size and mediastinal contours are within normal limits. Both lungs are clear. The visualized skeletal structures are unremarkable. IMPRESSION: No active cardiopulmonary disease. Electronically Signed   By: Donavan Foil M.D.   On: 09/05/2019 13:42   Ct Renal Stone Study  Result Date: 09/05/2019 CLINICAL DATA:  Fever, leukocytosis, history of recurrent UTIs EXAM: CT ABDOMEN AND PELVIS WITHOUT CONTRAST TECHNIQUE: Multidetector CT imaging of the abdomen and pelvis was performed following the standard protocol without IV contrast. COMPARISON:  10/18/2017 FINDINGS: Lower chest: Scarring/atelectasis at the lung bases. Hepatobiliary: Hepatic steatosis. Gallbladder is unremarkable. No intrahepatic or extrahepatic ductal dilatation. Pancreas: Within normal limits. Spleen: Within normal limits. Adrenals/Urinary Tract: Adrenal glands are within normal limits. Two left lower pole renal cysts, including a dominant 6.0 cm cyst in the medial left lower kidney with peripheral rim calcification (series 2/image 46), benign (Bosniak II). 7 mm nonobstructing left lower pole renal calculus. Right kidney is within normal limits. No ureteral or bladder calculi.  No hydronephrosis. Bladder is mildly thick-walled posteriorly but underdistended. Stomach/Bowel: Stomach is within normal limits. No evidence of bowel obstruction. Normal appendix (series 2/image 43). Vascular/Lymphatic: No evidence of abdominal aortic aneurysm. Atherosclerotic calcifications of the abdominal aorta and branch vessels. No  suspicious abdominopelvic lymphadenopathy. Reproductive: Prostatomegaly, with mild enlargement of the central gland. Mild inflammatory changes along the prostate and seminal vesicles (series 2/image 80), nonspecific. This appearance may be related to the patient's history of recurrent UTIs, although prostatitis is not excluded. No evidence of prostatic abscess on CT. Other: No abdominopelvic ascites. Small fat containing periumbilical hernia (series 2/image 63). Musculoskeletal: Visualized osseous structures are within normal limits. IMPRESSION: 7 mm nonobstructing left lower pole renal calculus. No hydronephrosis. Prostatomegaly, suggesting BPH. Mild inflammatory changes along the prostate and seminal vesicles at least raises the possibility of prostatitis. No evidence of prostatic abscess on CT. Mildly thick-walled bladder, although underdistended. 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 Additional ancillary findings as above. Electronically Signed   By: Julian Hy M.D.   On: 09/05/2019 19:09    Pending Labs Unresulted Labs (From admission, onward)    Start     Ordered   09/12/19 0500  Creatinine, serum  (enoxaparin (LOVENOX)    CrCl >/= 30 ml/min)  Weekly,   STAT    Comments: while on enoxaparin therapy    09/05/19 1835   09/06/19 3009  Basic metabolic panel  Tomorrow morning,   STAT     09/05/19 1835   09/06/19 0500  CBC  Tomorrow morning,   STAT     09/05/19 1835   09/05/19 1850  Lactic acid, plasma  Now then every 2 hours,   STAT     09/05/19 1849   09/05/19 1835  HIV antibody (Routine Testing)  Add-on,   AD     09/05/19 1835   09/05/19 1835  Hemoglobin A1c  Add-on,   AD    Comments: To assess prior glycemic control    09/05/19 1835   09/05/19 1441  Urine culture  ONCE - STAT,   STAT     09/05/19 1441   09/05/19 1229  Culture, blood (Routine x 2)  BLOOD CULTURE X 2,    STAT     09/05/19 1229          Vitals/Pain Today's Vitals   09/05/19 1800 09/05/19 1830 09/05/19 1901 09/05/19 1930  BP: 113/81 (!) 116/59 120/66 121/76  Pulse: (!) 119 (!) 119 (!) 104 (!) 124  Resp: (!) 21 (!) 30 20 (!) 37  Temp:      TempSrc:      SpO2: 96% 96% 96% 96%  Weight:      Height:      PainSc:  0-No pain      Isolation Precautions No active isolations  Medications Medications  sodium chloride 0.9 % bolus 1,000 mL (has no administration in time range)  enoxaparin (LOVENOX) injection 40 mg (has no administration in time range)  sodium chloride flush (NS) 0.9 % injection 3 mL (has no administration in time range)  acetaminophen (TYLENOL) tablet 650 mg (has no administration in time range)    Or  acetaminophen (TYLENOL) suppository 650 mg (has no administration in time range)  polyethylene glycol (MIRALAX / GLYCOLAX) packet 17 g (has no administration in time range)  ondansetron (ZOFRAN) tablet 4 mg (has no administration in time range)    Or  ondansetron (ZOFRAN) injection 4 mg (has no administration in time range)  insulin aspart (novoLOG) injection 0-15 Units (has no administration in time range)  insulin aspart (novoLOG) injection 0-5 Units (has no administration in time range)  cefTRIAXone (ROCEPHIN) 2 g in sodium chloride 0.9 % 100 mL IVPB (has no administration in time range)  sodium chloride 0.9 % bolus 1,000 mL (0 mLs Intravenous Stopped 09/05/19 1500)  acetaminophen (TYLENOL) tablet 1,000 mg (1,000 mg Oral Given 09/05/19 1239)  ceFEPIme (MAXIPIME) 2 g in sodium chloride 0.9 % 100 mL IVPB (0 g Intravenous Stopped 09/05/19 1556)  metroNIDAZOLE (FLAGYL) IVPB 500 mg (0 mg Intravenous Stopped 09/05/19 1717)  vancomycin (VANCOCIN) IVPB 1000 mg/200 mL premix (0 mg Intravenous Stopped 09/05/19 1602)  0.9 %  sodium chloride infusion (0 mLs Intravenous Stopped 09/05/19 1724)  sodium chloride 0.9 % bolus 1,000 mL (0 mLs Intravenous Stopped 09/05/19 1844)    And  sodium  chloride 0.9 % bolus 500 mL (0 mLs Intravenous Stopped 09/05/19 1844)  acetaminophen (TYLENOL) 325 MG tablet (650 mg  Given 09/05/19 1724)    Mobility walks Low fall risk   Focused Assessments Sepsis   R Recommendations: See Admitting Provider Note  Report given to:   Additional Notes:

## 2019-09-05 NOTE — ED Triage Notes (Signed)
Pt sent from [facility] for fever, elevated WBC and tachycardia without source.

## 2019-09-05 NOTE — Progress Notes (Signed)
Advance care planning  Purpose of Encounter Sepsis, UTI  Parties in Attendance Patient  Patients Decisional capacity Alert and oriented.  Able to make medical decisions.  His healthcare power of attorney is son Sports coach.  Discussed in detail regarding sepsis, UTI.  Treatment plan , prognosis discussed.  All questions answered  CODE STATUS discussed and patient wishes for aggressive medical care with CPR/defibrillation/intubation if needed  Orders entered and CODE STATUS changed  FULL CODE  Time spent - 17 minutes

## 2019-09-05 NOTE — ED Notes (Addendum)
Pt's vital rechecked at this time. Pt had just finished using the urinal. Pt hooked back up to monitor. Current vitals  BP-118/61 HR-124 RR-22 O2-95% RA

## 2019-09-05 NOTE — ED Notes (Signed)
Pt given food tray.

## 2019-09-05 NOTE — ED Notes (Signed)
Called pharmacy. They state would gather medications and send up soon.

## 2019-09-05 NOTE — H&P (Addendum)
[place] [facility] [place] NAME: Benjamin [name]    MR#:  [id number]  DATE OF BIRTH:  10/14/1951  DATE OF ADMISSION:  09/05/2019  PRIMARY CARE PHYSICIAN: Maryland Pink, MD   REQUESTING/REFERRING PHYSICIAN: Dr. Corky Downs  CHIEF COMPLAINT:   Chief Complaint  Patient presents with  . Fever    HISTORY OF PRESENT ILLNESS:  Benjamin [name]  is a 68 y.o. male with a known history of diabetes mellitus, history of UPJ obstruction with hydronephrosis, polycythemia, anxiety presents to the emergency room complaining of difficulty urination, burning and recurrent fevers over the last day.  Patient has felt extremely fatigued.  Had nausea and vomiting.  No abdominal pain or back pain.  No recent antibiotic use.  He has had recurrent UTIs in the past and UPJ obstruction and had surgery for it at [facility].  Here he had urine analysis which shows rare bacteria and 6-10 WBC.  Tachycardia, leukocytosis and sepsis. Elevated lactic acid.  PAST MEDICAL HISTORY:   Past Medical History:  Diagnosis Date  . Anxiety   . Arthritis   . Diabetes mellitus without complication ([place])   . Erythrocytosis 05/18/2015  . Erythrocytosis   . History of kidney stones   . 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 Polycythemia   . Sleep apnea    USES CPAP    PAST SURGICAL HISTORY:   Past Surgical History:  Procedure Laterality Date  . BLADDER STONE REMOVAL    . cataracts Bilateral   . COLONOSCOPY N/A 06/02/2015   Procedure: COLONOSCOPY;  Surgeon: [name], MD;  Location: [facility] ENDOSCOPY;  Service: Endoscopy;  Laterality: N/A;  . CYSTOSCOPY W/ RETROGRADES Left 11/01/2017   Procedure: CYSTOSCOPY WITH RETROGRADE PYELOGRAM;  Surgeon: [name], MD;  Location: [facility] ORS;  Service: Urology;  Laterality: Left;  . CYSTOSCOPY WITH STENT PLACEMENT Left 11/01/2017   Procedure: CYSTOSCOPY WITH STENT  PLACEMENT;  Surgeon: [name], MD;  Location: [facility] ORS;  Service: Urology;  Laterality: Left;    SOCIAL HISTORY:   Social History   Tobacco Use  . Smoking status: Never Smoker  . Smokeless tobacco: Never Used  Substance Use Topics  . Alcohol use: No    FAMILY HISTORY:   Family History  Problem Relation Age of Onset  . Heart disease Mother   . Diabetes Father     DRUG ALLERGIES:   Allergies  Allergen Reactions  . Sertraline Hcl Other (See Comments)    Vivid dreams    REVIEW OF SYSTEMS:   Review of Systems  Constitutional: Positive for malaise/fatigue. Negative for chills and fever.  HENT: Negative for sore throat.   Eyes: Negative for blurred vision, double vision and pain.  Respiratory: Negative for cough, hemoptysis, shortness of breath and wheezing.   Cardiovascular: Negative for chest pain, palpitations, orthopnea and leg swelling.  Gastrointestinal: Positive for nausea. Negative for abdominal pain, constipation, diarrhea, heartburn and vomiting.  Genitourinary: Positive for dysuria and frequency. Negative for hematuria.  Musculoskeletal: Negative for back pain and joint pain.  Skin: Negative for rash.  Neurological: Negative for sensory change, speech change, focal weakness and headaches.  Endo/Heme/Allergies: Does not bruise/bleed easily.  Psychiatric/Behavioral: Negative for depression. The patient is not nervous/anxious.     MEDICATIONS AT HOME:   Prior to Admission medications   Medication Sig Start Date End Date Taking? Authorizing Provider  glipiZIDE (GLUCOTROL XL) 10 MG 24  hr tablet Take 10 mg by mouth 2 (two) times daily.   Yes [provider]  HYDROcodone-acetaminophen (NORCO/VICODIN) 5-325 MG tablet Take 1 tablet by mouth 2 (two) times daily as needed for moderate pain. Reported on 06/15/2016 Patient taking differently: Take 1 tablet by mouth every 6 (six) hours as needed for moderate pain.  10/24/17  Yes McGowan, Larene Beach A, PA-C   insulin NPH Human (NOVOLIN N) 100 UNIT/ML injection Inject into the skin. 20 units every evening 02/12/19  Yes [provider]  lisinopril (PRINIVIL,ZESTRIL) 2.5 MG tablet Take 2.5 mg by mouth at bedtime.   Yes [provider]  LORazepam (ATIVAN) 1 MG tablet Take 1 tablet (1 mg total) by mouth daily. Patient taking differently: Take 1 mg by mouth 2 (two) times daily.  10/20/15  Yes Choksi, Delorise Shiner, MD  metFORMIN (GLUCOPHAGE-XR) 500 MG 24 hr tablet Take 1,000 mg by mouth 2 (two) times daily.   Yes [provider]  pioglitazone (ACTOS) 15 MG tablet TK 1 T PO ONCE D 12/28/18  Yes [provider]  pravastatin (PRAVACHOL) 10 MG tablet Take 10 mg by mouth at bedtime.    Yes [provider]  Semaglutide,0.25 or 0.5MG /DOS, 2 MG/1.5ML SOPN Inject 0.25 mg into the skin every 7 (seven) days.  05/15/19  Yes [provider]  terbinafine (LAMISIL) 250 MG tablet Take 250 mg by mouth at bedtime.   Yes [provider]     VITAL SIGNS:  Blood pressure (!) 116/59, pulse (!) 119, temperature 98.5 F (36.9 C), temperature source Oral, resp. rate (!) 30, height 5\' 7"  (1.702 m), weight 111.1 kg, SpO2 96 %.  PHYSICAL EXAMINATION:  Physical Exam  GENERAL:  68 y.o.-year-old patient lying in the bed with no acute distress.  EYES: Pupils equal, round, reactive to light and accommodation. No scleral icterus. Extraocular muscles intact.  HEENT: Head atraumatic, normocephalic. Oropharynx and nasopharynx clear. No oropharyngeal erythema, moist oral mucosa  NECK:  Supple, no jugular venous distention. No thyroid enlargement, no tenderness.  LUNGS: Normal breath sounds bilaterally, no wheezing, rales, rhonchi. No use of accessory muscles of respiration.  CARDIOVASCULAR: S1, S2 . Tachycardia ABDOMEN: Soft, nontender, nondistended. Bowel sounds present. No organomegaly or mass.  EXTREMITIES: No pedal edema, cyanosis, or clubbing. + 2 pedal & radial pulses b/l.    NEUROLOGIC: Cranial nerves II through XII are intact. No focal Motor or sensory deficits appreciated b/l PSYCHIATRIC: The patient is alert and oriented x 3. Good affect.  SKIN: No obvious rash, lesion, or ulcer.   LABORATORY PANEL:   CBC Recent Labs  Lab 09/05/19 1246  WBC 25.0*  HGB 17.5*  HCT 50.6  PLT 237   ------------------------------------------------------------------------------------------------------------------  Chemistries  Recent Labs  Lab 09/05/19 1246  NA 130*  K 4.3  CL 95*  CO2 19*  GLUCOSE 262*  BUN 15  CREATININE 1.09  CALCIUM 9.8  AST 18  ALT 20  ALKPHOS 58  BILITOT 1.4*   ------------------------------------------------------------------------------------------------------------------  Cardiac Enzymes No results for input(s): TROPONINI in the last 168 hours. ------------------------------------------------------------------------------------------------------------------  RADIOLOGY:  Dg Chest 2 View  Result Date: 09/05/2019 CLINICAL DATA:  Elevated white count with tachycardia EXAM: CHEST - 2 VIEW COMPARISON:  None. FINDINGS: The heart size and mediastinal contours are within normal limits. Both lungs are clear. The visualized skeletal structures are unremarkable. IMPRESSION: No active cardiopulmonary disease. Electronically Signed   By: Donavan Foil M.D.   On: 09/05/2019 13:42     IMPRESSION AND PLAN:   *  Acute prostatitis/UTI with sepsis Start IV ceftriaxone. Urine and blood cultures have been sent and pending I have ordered a stat CT stone protocol which shows no obstruction.  Does show inflammation of the prostate. Repeat lactic acid after fluid resuscitation.  *Hyponatremia secondary to dehydration.  Started IV fluids.  *Diabetes mellitus.  Sliding scale insulin ordered.  DVT prophylaxis with Lovenox   All the records are reviewed and case discussed with ED provider. Management plans discussed with the patient, family and  they are in agreement.  CODE STATUS: FULL CODE  TOTAL TIME TAKING CARE OF THIS PATIENT: 40 minutes.   Leia Alf Marwa Fuhrman M.D on 09/05/2019 at 6:36 PM  Between 7am to 6pm - Pager - [phone]  After 6pm go to [email or web address] - password [email or web address] [facility]  Office  [phone]  CC: Primary care physician; Maryland Pink, MD  Note: This dictation was prepared with Dragon dictation along with smaller phrase technology. Any transcriptional errors that result from this process are unintentional.

## 2019-09-05 NOTE — Progress Notes (Signed)
CODE SEPSIS - PHARMACY COMMUNICATION  **Broad Spectrum Antibiotics should be administered within 1 hour of Sepsis diagnosis**  Time Code Sepsis Called/Page Received: 09/05/2019 1446  Antibiotics Ordered: Cefepime, Vanc  Time of 1st antibiotic administration: [id number]  Additional action taken by pharmacy:    If necessary, Name of Provider/Nurse Contacted:      Noralee Space ,PharmD Clinical Pharmacist  09/05/2019  3:18 PM

## 2019-09-05 NOTE — ED Notes (Signed)
EKG performed, MD Corky Downs signed

## 2019-09-05 NOTE — Progress Notes (Signed)
PHARMACY -  BRIEF ANTIBIOTIC NOTE   Pharmacy has received consult(s) for Vancomycin, Cefepime from an ED provider.  The patient's profile has been reviewed for ht/wt/allergies/indication/available labs.    One time order(s) placed for Vancomycin and Cefepime by ED MD  Further antibiotics/pharmacy consults should be ordered by admitting physician if indicated.                       Thank you, Lisia Westbay A 09/05/2019  3:20 PM

## 2019-09-06 ENCOUNTER — Encounter: Payer: Self-pay | Admitting: *Deleted

## 2019-09-06 LAB — BASIC METABOLIC PANEL
Anion gap: 8 (ref 5–15)
BUN: 13 mg/dL (ref 8–23)
CO2: 19 mmol/L — ABNORMAL LOW (ref 22–32)
Calcium: 7.9 mg/dL — ABNORMAL LOW (ref 8.9–10.3)
Chloride: 104 mmol/L (ref 98–111)
Creatinine, Ser: 0.9 mg/dL (ref 0.61–1.24)
GFR calc Af Amer: >60 mL/min (ref >60)
GFR calc non Af Amer: >60 mL/min (ref >60)
Glucose, Bld: 163 mg/dL — ABNORMAL HIGH (ref 70–99)
Potassium: 3.4 mmol/L — ABNORMAL LOW (ref 3.5–5.1)
Sodium: 131 mmol/L — ABNORMAL LOW (ref 135–145)

## 2019-09-06 LAB — CBC
HCT: 41.3 % (ref 39.0–52.0)
Hemoglobin: 14.2 g/dL (ref 13.0–17.0)
MCH: 27.8 pg (ref 26.0–34.0)
MCHC: 34.4 g/dL (ref 30.0–36.0)
MCV: 81 fL (ref 80.0–100.0)
Platelets: 183 10*3/uL (ref 150–400)
RBC: 5.1 MIL/uL (ref 4.22–5.81)
RDW: 13.6 % (ref 11.5–15.5)
WBC: 14.4 10*3/uL — ABNORMAL HIGH (ref 4.0–10.5)
nRBC: 0 % (ref 0.0–0.2)

## 2019-09-06 LAB — GLUCOSE, CAPILLARY
Glucose-Capillary: 173 mg/dL — ABNORMAL HIGH (ref 70–99)
Glucose-Capillary: 129 mg/dL — ABNORMAL HIGH (ref 70–99)

## 2019-09-06 LAB — LACTIC ACID, PLASMA: Lactic Acid, Venous: 2.5 mmol/L (ref 0.5–1.9)

## 2019-09-06 MED ORDER — SULFAMETHOXAZOLE-TRIMETHOPRIM 800-160 MG PO TABS: 1.0000 | ORAL_TABLET | Freq: Two times a day (BID) | ORAL | 0 refills | Status: AC

## 2019-09-06 MED ORDER — SULFAMETHOXAZOLE-TRIMETHOPRIM 800-160 MG PO TABS
1.0000 | ORAL_TABLET | Freq: Two times a day (BID) | ORAL | Status: DC
  Administered 2019-09-06: 09:00:00 1 via ORAL
  Filled 2019-09-06 (×2): qty 1

## 2019-09-06 MED ORDER — INSULIN DETEMIR 100 UNIT/ML ~~LOC~~ SOLN
15.0000 [IU] | Freq: Every day | SUBCUTANEOUS | Status: DC
  Filled 2019-09-06: qty 0.15

## 2019-09-06 NOTE — Progress Notes (Signed)
PHARMACIST - PHYSICIAN ORDER COMMUNICATION  CONCERNING: NPH Insulin- Therapeutic Substitution    DESCRIPTION:  Per [facility] policy Detemir (Levemir) should be therapeutically substituted for NPH (Novolin N) Insulin 1:1.  Patient was taking NPH 20u QHS PTA. NPH 15 u QHS ordered inpatient.    ACTION TAKEN: Per [facility] policy- NPH insulin has been changed to Detemir 15units QHS.   Pernell Dupre, PharmD, BCPS Clinical Pharmacist 09/06/2019 7:57 AM

## 2019-09-06 NOTE — Progress Notes (Signed)
Patient discharged home per MD order. All discharge instructions given and all questions answered. 

## 2019-09-06 NOTE — ED Notes (Signed)
MD contacted about getting an order for c-pap since patient wears one at home at night and is asking for one. Contacted RT for c-pap and order placed.

## 2019-09-06 NOTE — Discharge Summary (Signed)
Benjamin [name] NAME: Benjamin [name]    MR#:  [id number]  DATE OF BIRTH:  1951/10/24  DATE OF ADMISSION:  09/05/2019   ADMITTING PHYSICIAN: Hillary Bow, MD  DATE OF DISCHARGE:  09/06/19  PRIMARY CARE PHYSICIAN: Maryland Pink, MD   ADMISSION DIAGNOSIS:   Severe sepsis ([place]) [A41.9, R65.20] UTI (urinary tract infection) [N39.0]  DISCHARGE DIAGNOSIS:   Active Problems:   UTI (urinary tract infection)   SECONDARY DIAGNOSIS:   Past Medical History:  Diagnosis Date  . Anxiety   . Arthritis   . Diabetes mellitus without complication ([place])   . Erythrocytosis 05/18/2015  . Erythrocytosis   . History of kidney stones   . 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 Polycythemia   . Sleep apnea    USES CPAP    HOSPITAL COURSE:   68 year old male with prior history of UPJ obstruction with hydronephrosis, diabetes, arthritis, sleep apnea presents to hospital secondary to fevers and dysuria.  1.  Acute prostatitis-could cause fevers, dysuria, if left untreated can cause bacteremia. -Started on Rocephin in the hospital.  Cultures are pending at this time. -CT of the abdomen does not show any obstructing stone. -Received IV fluids.  Being discharged on Bactrim for 4 weeks -Since will be discharged on Bactrim, will need to follow-up blood work in 10 days to 14 days, with PCP or urology -Outpatient urology follow-up  2.  Hyponatremia-stable.  Received fluids in the hospital.  3.-Diabetes mellitus-on metformin, NPH insulin and semaglutide.  Advised to hold glipizide and Actos given low sugars in the hospital.  4.  Obstructive sleep apnea-continue CPAP at bedtime  Patient has been up and ambulatory.  Will be discharged home today  DISCHARGE CONDITIONS:   Guarded  CONSULTS OBTAINED:   None  DRUG ALLERGIES:   Allergies  Allergen Reactions  .  Sertraline Hcl Other (See Comments)    Vivid dreams   DISCHARGE MEDICATIONS:   Allergies as of 09/06/2019      Reactions   Sertraline Hcl Other (See Comments)   Vivid dreams      Medication List    STOP taking these medications   glipiZIDE 10 MG 24 hr tablet Commonly known as: GLUCOTROL XL   pioglitazone 15 MG tablet Commonly known as: ACTOS     TAKE these medications   HYDROcodone-acetaminophen 5-325 MG tablet Commonly known as: NORCO/VICODIN Take 1 tablet by mouth 2 (two) times daily as needed for moderate pain. Reported on 06/15/2016 What changed:   when to take this  additional instructions   insulin NPH Human 100 UNIT/ML injection Commonly known as: NOVOLIN N Inject into the skin. 20 units every evening   lisinopril 2.5 MG tablet Commonly known as: ZESTRIL Take 2.5 mg by mouth at bedtime.   LORazepam 1 MG tablet Commonly known as: ATIVAN Take 1 tablet (1 mg total) by mouth daily. What changed: when to take this   metFORMIN 500 MG 24 hr tablet Commonly known as: GLUCOPHAGE-XR Take 1,000 mg by mouth 2 (two) times daily.   pravastatin 10 MG tablet Commonly known as: PRAVACHOL Take 10 mg by mouth at bedtime.   Semaglutide(0.25 or 0.5MG /DOS) 2 MG/1.5ML Sopn Inject 0.25 mg into the skin every 7 (seven) days.   sulfamethoxazole-trimethoprim 800-160 MG tablet Commonly known as: BACTRIM DS Take 1 tablet by mouth every 12 (twelve) hours for 28 days.   terbinafine  250 MG tablet Commonly known as: LAMISIL Take 250 mg by mouth at bedtime.        DISCHARGE INSTRUCTIONS:   1. PCP f/u in 1-2weeks 2. Urology f/u in 2 weeks  DIET:   Cardiac diet  ACTIVITY:   Activity as tolerated  OXYGEN:   Home Oxygen: No.  Oxygen Delivery: room air  DISCHARGE LOCATION:   home   If you experience worsening of your admission symptoms, develop shortness of breath, life threatening emergency, suicidal or homicidal thoughts you must seek medical attention  immediately by calling 911 or calling your MD immediately  if symptoms less severe.  You Must read complete instructions/literature along with all the possible adverse reactions/side effects for all the Medicines you take and that have been prescribed to you. Take any new Medicines after you have completely understood and accpet all the possible adverse reactions/side effects.   Please note  You were cared for by a hospitalist during your hospital stay. If you have any questions about your discharge medications or the care you received while you were in the hospital after you are discharged, you can call the unit and asked to speak with the hospitalist on call if the hospitalist that took care of you is not available. Once you are discharged, your primary care physician will handle any further medical issues. Please note that NO REFILLS for any discharge medications will be authorized once you are discharged, as it is imperative that you return to your primary care physician (or establish a relationship with a primary care physician if you do not have one) for your aftercare needs so that they can reassess your need for medications and monitor your lab values.    On the day of Discharge:  VITAL SIGNS:   Blood pressure (!) 125/53, pulse (!) 107, temperature 98.7 F (37.1 C), temperature source Oral, resp. rate 18, height 5\' 7"  (1.702 m), weight 111.1 kg, SpO2 97 %.  PHYSICAL EXAMINATION:    GENERAL:  68 y.o.-year-old obese patient lying in the bed with no acute distress.  EYES: Pupils equal, round, reactive to light and accommodation. No scleral icterus. Extraocular muscles intact.  HEENT: Head atraumatic, normocephalic. Oropharynx and nasopharynx clear.  NECK:  Supple, no jugular venous distention. No thyroid enlargement, no tenderness.  LUNGS: Normal breath sounds bilaterally, no wheezing, rales,rhonchi or crepitation. No use of accessory muscles of respiration. Decreased bibasilar breath  sounds CARDIOVASCULAR: S1, S2 normal. No  rubs, or gallops. 2/6 systolic murmur present ABDOMEN: Soft, non-tender, non-distended. Bowel sounds present. No organomegaly or mass.  EXTREMITIES: No pedal edema, cyanosis, or clubbing.  NEUROLOGIC: Cranial nerves II through XII are intact. Muscle strength 5/5 in all extremities. Sensation intact. Gait not checked.  PSYCHIATRIC: The patient is alert and oriented x 3.  SKIN: No obvious rash, lesion, or ulcer.   DATA REVIEW:   CBC Recent Labs  Lab 09/06/19 0615  WBC 14.4*  HGB 14.2  HCT 41.3  PLT 183    Chemistries  Recent Labs  Lab 09/05/19 1246 09/06/19 0615  NA 130* 131*  K 4.3 3.4*  CL 95* 104  CO2 19* 19*  GLUCOSE 262* 163*  BUN 15 13  CREATININE 1.09 0.90  CALCIUM 9.8 7.9*  AST 18  --   ALT 20  --   ALKPHOS 58  --   BILITOT 1.4*  --      Microbiology Results  Results for orders placed or performed during the hospital encounter of 09/05/19  Culture,  blood (Routine x 2)     Status: None (Preliminary result)   Collection Time: 09/05/19 12:34 PM   Specimen: BLOOD  Result Value Ref Range Status   Specimen Description BLOOD LEFT ANTECUBITAL  Final   Special Requests   Final    BOTTLES DRAWN AEROBIC AND ANAEROBIC Blood Culture adequate volume   Culture   Final    NO GROWTH < 24 HOURS Performed at [facility], [address] [place], [place] [address]    Report Status PENDING  Incomplete  SARS Coronavirus 2 [facility] order, Performed in [place] hospital lab) Nasopharyngeal Nasopharyngeal Swab     Status: None   Collection Time: 09/05/19  2:34 PM   Specimen: Nasopharyngeal Swab  Result Value Ref Range Status   SARS Coronavirus 2 NEGATIVE NEGATIVE Final    Comment: (NOTE) If result is NEGATIVE SARS-CoV-2 target nucleic acids are NOT DETECTED. The SARS-CoV-2 RNA is generally detectable in upper and lower  respiratory specimens during the acute phase of infection. The lowest  concentration of  SARS-CoV-2 viral copies this assay can detect is 250  copies / mL. A negative result does not preclude SARS-CoV-2 infection  and should not be used as the sole basis for treatment or other  patient management decisions.  A negative result may occur with  improper specimen collection / handling, submission of specimen other  than nasopharyngeal swab, presence of viral mutation(s) within the  areas targeted by this assay, and inadequate number of viral copies  (<250 copies / mL). A negative result must be combined with clinical  observations, patient history, and epidemiological information. If result is POSITIVE SARS-CoV-2 target nucleic acids are DETECTED. The SARS-CoV-2 RNA is generally detectable in upper and lower  respiratory specimens dur ing the acute phase of infection.  Positive  results are indicative of active infection with SARS-CoV-2.  Clinical  correlation with patient history and other diagnostic information is  necessary to determine patient infection status.  Positive results do  not rule out bacterial infection or co-infection with other viruses. If result is PRESUMPTIVE POSTIVE SARS-CoV-2 nucleic acids MAY BE PRESENT.   A presumptive positive result was obtained on the submitted specimen  and confirmed on repeat testing.  While 2019 novel coronavirus  (SARS-CoV-2) nucleic acids may be present in the submitted sample  additional confirmatory testing may be necessary for epidemiological  and / or clinical management purposes  to differentiate between  SARS-CoV-2 and other Sarbecovirus currently known to infect humans.  If clinically indicated additional testing with an alternate test  methodology [id number]) is advised. The SARS-CoV-2 RNA is generally  detectable in upper and lower respiratory sp ecimens during the acute  phase of infection. The expected result is Negative. Fact Sheet for Patients:  StrictlyIdeas.no Fact Sheet for Healthcare  Providers: BankingDealers.co.za This test is not yet approved or cleared by the [place] FDA and has been authorized for detection and/or diagnosis of SARS-CoV-2 by FDA under an Emergency Use Authorization (EUA).  This EUA will remain in effect (meaning this test can be used) for the duration of the COVID-19 declaration under Section 564(b)(1) of the Act, 21 U.S.C. section 360bbb-3(b)(1), unless the authorization is terminated or revoked sooner. Performed at [facility], [address] [place], [place] [address]     RADIOLOGY:  Ct Renal Stone Study  Result Date: 09/05/2019 CLINICAL DATA:  Fever, leukocytosis, history of recurrent UTIs EXAM: CT ABDOMEN AND PELVIS WITHOUT CONTRAST TECHNIQUE: Multidetector CT imaging of the abdomen  and pelvis was performed following the standard protocol without IV contrast. COMPARISON:  10/18/2017 FINDINGS: Lower chest: Scarring/atelectasis at the lung bases. Hepatobiliary: Hepatic steatosis. Gallbladder is unremarkable. No intrahepatic or extrahepatic ductal dilatation. Pancreas: Within normal limits. Spleen: Within normal limits. Adrenals/Urinary Tract: Adrenal glands are within normal limits. Two left lower pole renal cysts, including a dominant 6.0 cm cyst in the medial left lower kidney with peripheral rim calcification (series 2/image 46), benign (Bosniak II). 7 mm nonobstructing left lower pole renal calculus. Right kidney is within normal limits. No ureteral or bladder calculi.  No hydronephrosis. Bladder is mildly thick-walled posteriorly but underdistended. Stomach/Bowel: Stomach is within normal limits. No evidence of bowel obstruction. Normal appendix (series 2/image 43). Vascular/Lymphatic: No evidence of abdominal aortic aneurysm. Atherosclerotic calcifications of the abdominal aorta and branch vessels. No suspicious abdominopelvic lymphadenopathy. Reproductive: Prostatomegaly, with mild enlargement of the central  gland. Mild inflammatory changes along the prostate and seminal vesicles (series 2/image 80), nonspecific. This appearance may be related to the patient's history of recurrent UTIs, although prostatitis is not excluded. No evidence of prostatic abscess on CT. Other: No abdominopelvic ascites. Small fat containing periumbilical hernia (series 2/image 63). Musculoskeletal: Visualized osseous structures are within normal limits. IMPRESSION: 7 mm nonobstructing left lower pole renal calculus. No hydronephrosis. Prostatomegaly, suggesting BPH. Mild inflammatory changes along the prostate and seminal vesicles at least raises the possibility of prostatitis. No evidence of prostatic abscess on CT. Mildly thick-walled bladder, although underdistended. 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 Additional ancillary findings as above. Electronically Signed   By: Julian Hy M.D.   On: 09/05/2019 19:09     Management plans discussed with the patient, family and they are in agreement.  CODE STATUS:     Code Status Orders  (From admission, onward)         Start     Ordered   09/05/19 1834  Full code  Continuous     09/05/19 1835        Code Status History    This patient has a current code status but no historical code status.   Advance Care Planning Activity      TOTAL TIME TAKING CARE OF THIS PATIENT: 38 minutes.    Gladstone Lighter M.D on 09/06/2019 at 4:00 PM  Between 7am to 6pm - Pager - [phone]  After 6pm go to [email or web address] - Proofreader  [facility]  Office  [phone]  CC: Primary care physician; Maryland Pink, MD   Note: This dictation was prepared with Dragon dictation along with smaller phrase technology. Any transcriptional errors that result from this process are unintentional.

## 2019-09-07 LAB — HIV ANTIBODY (ROUTINE TESTING W REFLEX): HIV Screen 4th Generation wRfx: NONREACTIVE

## 2019-09-07 LAB — HEMOGLOBIN A1C
Hgb A1c MFr Bld: 6.8 % — ABNORMAL HIGH (ref 4.8–5.6)
Mean Plasma Glucose: 148 mg/dL

## 2019-09-07 LAB — URINE CULTURE

## 2019-09-10 LAB — CULTURE, BLOOD (ROUTINE X 2)
Culture: NO GROWTH
Special Requests: ADEQUATE

## 2019-10-03 ENCOUNTER — Ambulatory Visit: Payer: Medicare HMO | Admitting: Urology

## 2019-10-03 ENCOUNTER — Other Ambulatory Visit: Payer: Self-pay

## 2019-10-03 ENCOUNTER — Encounter: Payer: Self-pay | Admitting: Urology

## 2019-10-03 VITALS — BP 141/83 | HR 111 | Ht 67.0 in | Wt 246.1 lb

## 2019-10-03 DIAGNOSIS — Z87438 Personal history of other diseases of male genital organs: Secondary | ICD-10-CM

## 2019-10-03 DIAGNOSIS — N401 Enlarged prostate with lower urinary tract symptoms: Secondary | ICD-10-CM

## 2019-10-03 LAB — MICROSCOPIC EXAMINATION: Bacteria, UA: NONE SEEN

## 2019-10-03 LAB — URINALYSIS, COMPLETE
Bilirubin, UA: NEGATIVE
Glucose, UA: NEGATIVE
Ketones, UA: NEGATIVE
Nitrite, UA: NEGATIVE
Protein,UA: NEGATIVE
Specific Gravity, UA: >1.03 — ABNORMAL HIGH (ref 1.005–1.030)
Urobilinogen, Ur: 0.2 mg/dL (ref 0.2–1.0)
pH, UA: 5.5 (ref 5.0–7.5)

## 2019-10-03 NOTE — Progress Notes (Signed)
10/03/2019 1:05 PM   Benjamin [name] [birth year] [id number]  Referring provider: Maryland Pink, MD [address] [facility] [place],  [place] [address]  Chief Complaint  Patient presents with  . Urinary Frequency    HPI: 68 y.o. male presents for recent hospital follow-up.  He presented to the ED on 9/9 with acute onset of fever, chills, nausea, dysuria and urinary hesitancy.  He was admitted for acute prostatitis.  Urinalysis showed mild pyuria and urine culture grew multiple species.  His blood cultures were negative.  CT showed a nonobstructing left renal calculus.  There was no hydronephrosis or bladder distention.  He was discharged on a 4-week course of Bactrim which he completes tomorrow.  His symptoms have resolved.  He has moderate lower urinary tract symptoms presently on no medication.  He also complains of ED and states Dr. Kary Kos had discussed low-dose tadalafil which he is interested in trying.  He also had a left UPJ obstruction and requested referral to [facility] for definitive management.  He was treated with PCN placement and dilation of the UPJ with nephroureteral stent placement by IR at [facility].  He had extended nephrostomy tube drainage and it was subsequently moved August 2019.  Follow-up Lasix renal scan showed no obstruction.  Recent CT showed resolution of his left hydronephrosis.   PMH: Past Medical History:  Diagnosis Date  . Anxiety   . Arthritis   . Diabetes mellitus without complication ([place])   . Erythrocytosis 05/18/2015  . Erythrocytosis   . History of kidney stones   . 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 Polycythemia   . Sleep apnea    USES CPAP    Surgical History: Past Surgical History:  Procedure Laterality Date  . BLADDER STONE REMOVAL    . cataracts Bilateral   . COLONOSCOPY N/A 06/02/2015   Procedure: COLONOSCOPY;  Surgeon: [name],  MD;  Location: [facility] ENDOSCOPY;  Service: Endoscopy;  Laterality: N/A;  . CYSTOSCOPY W/ RETROGRADES Left 11/01/2017   Procedure: CYSTOSCOPY WITH RETROGRADE PYELOGRAM;  Surgeon: [name], MD;  Location: [facility] ORS;  Service: Urology;  Laterality: Left;  . CYSTOSCOPY WITH STENT PLACEMENT Left 11/01/2017   Procedure: CYSTOSCOPY WITH STENT PLACEMENT;  Surgeon: [name], MD;  Location: [facility] ORS;  Service: Urology;  Laterality: Left;    Home Medications:  Allergies as of 10/03/2019      Reactions   Sertraline Hcl Other (See Comments)   Vivid dreams      Medication List       Accurate as of October 03, 2019  1:05 PM. If you have any questions, ask your nurse or doctor.        HYDROcodone-acetaminophen 5-325 MG tablet Commonly known as: NORCO/VICODIN Take 1 tablet by mouth 2 (two) times daily as needed for moderate pain. Reported on 06/15/2016 What changed:   when to take this  additional instructions   insulin NPH Human 100 UNIT/ML injection Commonly known as: NOVOLIN N Inject into the skin. 20 units every evening   lisinopril 2.5 MG tablet Commonly known as: ZESTRIL Take 2.5 mg by mouth at bedtime.   LORazepam 1 MG tablet Commonly known as: ATIVAN Take 1 tablet (1 mg total) by mouth daily. What changed: when to take this   metFORMIN 500 MG 24 hr tablet Commonly known as: GLUCOPHAGE-XR Take 1,000 mg by mouth 2 (two) times daily.   pravastatin  10 MG tablet Commonly known as: PRAVACHOL Take 10 mg by mouth at bedtime.   Semaglutide(0.25 or 0.5MG /DOS) 2 MG/1.5ML Sopn Inject 0.25 mg into the skin every 7 (seven) days.   sulfamethoxazole-trimethoprim 800-160 MG tablet Commonly known as: BACTRIM DS Take 1 tablet by mouth every 12 (twelve) hours for 28 days.   terbinafine 250 MG tablet Commonly known as: LAMISIL Take 250 mg by mouth at bedtime.       Allergies:  Allergies  Allergen Reactions  . Sertraline Hcl Other (See Comments)    Vivid dreams     Family History: Family History  Problem Relation Age of Onset  . Heart disease Mother   . Diabetes Father     Social History:  reports that he has never smoked. He has never used smokeless tobacco. He reports that he does not drink alcohol or use drugs.  ROS: UROLOGY Frequent Urination?: Yes Hard to postpone urination?: No Burning/pain with urination?: No Get up at night to urinate?: Yes Leakage of urine?: No Urine stream starts and stops?: Yes Trouble starting stream?: No Do you have to strain to urinate?: Yes Blood in urine?: No Urinary tract infection?: Yes Sexually transmitted disease?: No Injury to kidneys or bladder?: No Painful intercourse?: No Weak stream?: Yes Erection problems?: Yes Penile pain?: No  Gastrointestinal Nausea?: No Vomiting?: No Indigestion/heartburn?: No Diarrhea?: No Constipation?: No  Constitutional Fever: No Night sweats?: No Weight loss?: No Fatigue?: Yes  Skin Skin rash/lesions?: No Itching?: No  Eyes Blurred vision?: No Double vision?: No  Ears/Nose/Throat Sore throat?: No Sinus problems?: No  Hematologic/Lymphatic Swollen glands?: No Easy bruising?: No  Cardiovascular Leg swelling?: No Chest pain?: No  Respiratory Cough?: No Shortness of breath?: No  Endocrine Excessive thirst?: No  Musculoskeletal Back pain?: No Joint pain?: Yes  Neurological Headaches?: No Dizziness?: No  Psychologic Depression?: No Anxiety?: Yes  Physical Exam: BP (!) 141/83 (BP Location: Left Arm, Patient Position: Sitting, Cuff Size: Normal)   Pulse (!) 111   Ht 5\' 7"  (1.702 m)   Wt 246 lb 1.6 oz (111.6 kg)   BMI 38.54 kg/m   Constitutional:  Alert and oriented, No acute distress. HEENT: [place] AT, moist mucus membranes.  Trachea midline, no masses. Cardiovascular: No clubbing, cyanosis, or edema. Respiratory: Normal respiratory effort, no increased work of breathing. GI: Abdomen is soft, nontender, nondistended, no abdominal  masses GU: No CVA tenderness Lymph: No cervical or inguinal lymphadenopathy. Skin: No rashes, bruises or suspicious lesions. Neurologic: Grossly intact, no focal deficits, moving all 4 extremities. Psychiatric: Normal mood and affect.  Laboratory Data:  Urinalysis: Pending   Assessment & Plan:    - Recent hospitalization Clinically consistent with acute prostatitis.  Urine cultures grew multiple species.  He is presently asymptomatic.  Urinalysis today is pending.  - BPH with lower urinary tract symptoms He requested a trial of generic tadalafil.  90-day Rx was sent.  - History left UPJ obstruction Resolution with dilation by IR at [facility]  Follow-up annually or as needed for any change in symptoms.   [name], [place] [address] [place],  [address] [phone]

## 2019-10-04 ENCOUNTER — Telehealth: Payer: Self-pay | Admitting: Urology

## 2019-10-04 ENCOUNTER — Telehealth: Payer: Self-pay

## 2019-10-04 MED ORDER — TADALAFIL 5 MG PO TABS: 5.0000 mg | ORAL_TABLET | Freq: Every day | ORAL | 0 refills | Status: DC

## 2019-10-04 NOTE — Telephone Encounter (Signed)
-----   Message from [name], MD sent at 10/04/2019  7:16 AM EDT ----- Please let patient know his urinalysis did show white blood cells.  A urine culture was ordered.

## 2019-10-04 NOTE — Telephone Encounter (Signed)
RX sent to [facility] today.

## 2019-10-04 NOTE — Telephone Encounter (Signed)
Pt would like refill for Cialis sent to [facility] since it's cheaper.  I think it got sent to [facility].

## 2019-10-04 NOTE — Telephone Encounter (Signed)
Called pt informed him of the information below. Pt gave verbal understanding.  

## 2019-10-06 LAB — CULTURE, URINE COMPREHENSIVE

## 2019-10-08 ENCOUNTER — Telehealth: Payer: Self-pay

## 2019-10-08 NOTE — Telephone Encounter (Signed)
Called pt informed him of the information below. Pt gave verbal understanding.  

## 2019-10-08 NOTE — Telephone Encounter (Signed)
-----   Message from [name], MD sent at 10/08/2019  9:03 AM EDT ----- Urine culture was negative for infection

## 2019-11-12 ENCOUNTER — Other Ambulatory Visit: Payer: Self-pay

## 2019-11-12 ENCOUNTER — Inpatient Hospital Stay (HOSPITAL_BASED_OUTPATIENT_CLINIC_OR_DEPARTMENT_OTHER): Payer: Medicare HMO | Admitting: Oncology

## 2019-11-12 ENCOUNTER — Inpatient Hospital Stay: Payer: Medicare HMO

## 2019-11-12 ENCOUNTER — Encounter: Payer: Self-pay | Admitting: Oncology

## 2019-11-12 ENCOUNTER — Inpatient Hospital Stay: Payer: Medicare HMO | Attending: Oncology

## 2019-11-12 VITALS — BP 138/81 | HR 101 | Temp 96.8°F | Resp 18 | Wt 247.2 lb

## 2019-11-12 DIAGNOSIS — D751 Secondary polycythemia: Secondary | ICD-10-CM | POA: Diagnosis not present

## 2019-11-12 DIAGNOSIS — N281 Cyst of kidney, acquired: Secondary | ICD-10-CM | POA: Diagnosis not present

## 2019-11-12 DIAGNOSIS — Z7984 Long term (current) use of oral hypoglycemic drugs: Secondary | ICD-10-CM | POA: Diagnosis not present

## 2019-11-12 DIAGNOSIS — E119 Type 2 diabetes mellitus without complications: Secondary | ICD-10-CM | POA: Diagnosis not present

## 2019-11-12 DIAGNOSIS — K760 Fatty (change of) liver, not elsewhere classified: Secondary | ICD-10-CM | POA: Diagnosis not present

## 2019-11-12 DIAGNOSIS — F419 Anxiety disorder, unspecified: Secondary | ICD-10-CM | POA: Diagnosis not present

## 2019-11-12 DIAGNOSIS — G4733 Obstructive sleep apnea (adult) (pediatric): Secondary | ICD-10-CM

## 2019-11-12 DIAGNOSIS — I700 Atherosclerosis of aorta: Secondary | ICD-10-CM | POA: Insufficient documentation

## 2019-11-12 DIAGNOSIS — M129 Arthropathy, unspecified: Secondary | ICD-10-CM | POA: Diagnosis not present

## 2019-11-12 DIAGNOSIS — Z87442 Personal history of urinary calculi: Secondary | ICD-10-CM | POA: Insufficient documentation

## 2019-11-12 LAB — CBC WITH DIFFERENTIAL/PLATELET
Abs Immature Granulocytes: 0.03 10*3/uL (ref 0.00–0.07)
Basophils Absolute: 0.1 10*3/uL (ref 0.0–0.1)
Basophils Relative: 1 %
Eosinophils Absolute: 0.2 10*3/uL (ref 0.0–0.5)
Eosinophils Relative: 2 %
HCT: 51.9 % (ref 39.0–52.0)
Hemoglobin: 17.5 g/dL — ABNORMAL HIGH (ref 13.0–17.0)
Immature Granulocytes: 0 %
Lymphocytes Relative: 30 %
Lymphs Abs: 3.1 10*3/uL (ref 0.7–4.0)
MCH: 27.9 pg (ref 26.0–34.0)
MCHC: 33.7 g/dL (ref 30.0–36.0)
MCV: 82.8 fL (ref 80.0–100.0)
Monocytes Absolute: 0.9 10*3/uL (ref 0.1–1.0)
Monocytes Relative: 8 %
Neutro Abs: 6 10*3/uL (ref 1.7–7.7)
Neutrophils Relative %: 59 %
Platelets: 269 10*3/uL (ref 150–400)
RBC: 6.27 MIL/uL — ABNORMAL HIGH (ref 4.22–5.81)
RDW: 13.6 % (ref 11.5–15.5)
WBC: 10.2 10*3/uL (ref 4.0–10.5)
nRBC: 0 % (ref 0.0–0.2)

## 2019-11-12 NOTE — Progress Notes (Signed)
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  BCR able and Jak 2 mutation testing was negative in 2011.  He does have a history of obstructive sleep apnea and uses CPAP.  He is [name] patient.  He was initially getting phlebotomy every 2 weeks but now gets it every 2 months if need be.  His threshold for phlebotomy is a hematocrit of greater than 47.  Patient does report improvement in his fatigue for a few days after he gets a phlebotomy   patient previously follows up with [name]. He switches care to me on 01/15/2019.  06/15/2016 JAK2 [id number] negative, Exons 12-15 mutation negative.  06/15/2017 Erythropoietin 12.8.  05/15/2019 further testing including MPL, carl mutation which both are negative  Interval history-  68 y.o. male with a history of erythrocytosis present to follow-up. Patient reports being compliant using CPAP machine. Denies any new complaints today. No pain, fever, chills, shortness of breath, nausea, vomiting, unintentional weight loss. Chronic fatigue is at baseline.  Not worse.  Patient was recently admitted due to sepsis secondary to UTI/acute prostatitis.  Was treated with supportive care/antibiotics.  Symptoms improved.  Today no urinary tract symptoms. He had a CT renal stone study done on 09/05/2019.  Review of systems- Review of Systems  Constitutional: Positive for malaise/fatigue. Negative for chills, fever and weight loss.  HENT: Negative for congestion, ear discharge, nosebleeds and sore throat.    Eyes: Negative for blurred vision and redness.  Respiratory: Negative for cough, hemoptysis, sputum production, shortness of breath and wheezing.   Cardiovascular: Negative for chest pain, palpitations, orthopnea, claudication and leg swelling.  Gastrointestinal: Negative for abdominal pain, blood in stool, constipation, diarrhea, heartburn, melena, nausea and vomiting.  Genitourinary: Negative for dysuria, flank pain, frequency, hematuria and urgency.  Musculoskeletal: Negative for back pain, joint pain and myalgias.  Skin: Negative for rash.  Neurological: Negative for dizziness, tingling, tremors, focal weakness, seizures, weakness and headaches.  Endo/Heme/Allergies: Does not bruise/bleed easily.  Psychiatric/Behavioral: Negative for depression, hallucinations and suicidal ideas. The patient does not have insomnia.        Allergies  Allergen Reactions  . Sertraline Hcl Other (See Comments)    Vivid dreams     Past Medical History:  Diagnosis Date  . Anxiety   . Arthritis   . Diabetes mellitus without complication ([place])   . Erythrocytosis 05/18/2015  . Erythrocytosis   . History of kidney stones   . 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 Polycythemia   . Sleep apnea    USES CPAP     Past Surgical History:  Procedure Laterality Date  . BLADDER STONE REMOVAL    . cataracts Bilateral   . COLONOSCOPY N/A 06/02/2015   Procedure: COLONOSCOPY;  Surgeon: [name], MD;  Location: [facility] ENDOSCOPY;  Service: Endoscopy;  Laterality: N/A;  . CYSTOSCOPY W/ RETROGRADES Left 11/01/2017   Procedure: CYSTOSCOPY WITH RETROGRADE PYELOGRAM;  Surgeon: [name], MD;  Location: [facility] ORS;  Service: Urology;  Laterality: Left;  . CYSTOSCOPY WITH STENT PLACEMENT Left 11/01/2017   Procedure: CYSTOSCOPY WITH STENT PLACEMENT;  Surgeon: [name], MD;  Location: [facility] ORS;  Service: Urology;   Laterality: Left;    Social History   Socioeconomic History  . Marital status: Married    Spouse name: Not on file  . Number of children: Not on file  . Years of education: Not on file  . Highest education level: Not on file  Occupational History  . Not on file  Social Needs  . Financial resource strain: Not on file  . Food insecurity    Worry: Not on file    Inability: Not on file  . Transportation needs    Medical: Not on file    Non-medical: Not on file  Tobacco Use  . Smoking status: Never Smoker  . Smokeless tobacco: Never Used  Substance and Sexual Activity  . Alcohol use: No  . Drug use: No  . Sexual activity: Not on file  Lifestyle  . Physical activity    Days per week: Not on file    Minutes per session: Not on file  . Stress: Not on file  Relationships  . Social Herbalist on phone: Not on file    Gets together: Not on file    Attends religious service: Not on file    Active member of club or organization: Not on file    Attends meetings of clubs or organizations: Not on file    Relationship status: Not on file  . Intimate partner violence    Fear of current or ex partner: Not on file    Emotionally abused: Not on file    Physically abused: Not on file    Forced sexual activity: Not on file  Other Topics Concern  . Not on file  Social History Narrative  . Not on file    Family History  Problem Relation Age of Onset  . Heart disease Mother   . Diabetes Father      Current Outpatient Medications:  .  HYDROcodone-acetaminophen (NORCO/VICODIN) 5-325 MG tablet, Take 1 tablet by mouth 2 (two) times daily as needed for moderate pain. Reported on 06/15/2016 (Patient taking differently: Take 1 tablet by mouth every 6 (six) hours as needed for moderate pain. ), Disp: 30 tablet, Rfl: 0 .  insulin NPH Human (NOVOLIN N) 100 UNIT/ML injection, Inject into the skin. 20 units every evening, Disp: , Rfl:  .  lisinopril (PRINIVIL,ZESTRIL) 2.5 MG tablet,  Take 2.5 mg by mouth at bedtime., Disp: , Rfl:  .  LORazepam (ATIVAN) 1 MG tablet, Take 1 tablet (1 mg total) by mouth daily. (Patient taking differently: Take 1 mg by mouth 2 (two) times daily. ), Disp: 15 tablet, Rfl: 0 .  metFORMIN (GLUCOPHAGE-XR) 500 MG 24 hr tablet, Take 1,000 mg by mouth 2 (two) times daily., Disp: , Rfl:  .  pravastatin (PRAVACHOL) 10 MG tablet, Take 10 mg by mouth at bedtime. , Disp: , Rfl:  .  Semaglutide,0.25 or 0.5MG /DOS, 2 MG/1.5ML SOPN, Inject 0.25 mg into the skin every 7 (seven) days. , Disp: , Rfl:  .  tadalafil (CIALIS) 5 MG tablet, Take 1 tablet (5 mg total) by mouth daily., Disp: 90 tablet, Rfl: 0 .  terbinafine (LAMISIL) 250 MG tablet, Take 250 mg by mouth at bedtime., Disp: , Rfl:   Current Facility-Administered Medications:  .  lidocaine (XYLOCAINE) 2 % jelly 1 application, 1 application, Urethral, Once, Stoioff,  Ronda Fairly, MD  Physical exam: ECOG 1 Vitals:   11/12/19 1325  BP: 138/81  Pulse: (!) 101  Resp: 18  Temp: (!) 96.8 F (36 C)  Weight: 247 lb 3.2 oz (112.1 kg)   Physical Exam Constitutional:      General: He is not in acute distress.    Appearance: Normal appearance. He is obese.  HENT:     Head: Normocephalic and atraumatic.  Eyes:     General: No scleral icterus.    Pupils: Pupils are equal, round, and reactive to light.  Neck:     Musculoskeletal: Normal range of motion and neck supple.  Cardiovascular:     Rate and Rhythm: Normal rate and regular rhythm.     Heart sounds: Normal heart sounds.  Pulmonary:     Effort: Pulmonary effort is normal. No respiratory distress.     Breath sounds: Normal breath sounds. No wheezing.  Abdominal:     General: Bowel sounds are normal. There is no distension.     Palpations: Abdomen is soft. There is no mass.     Tenderness: There is no abdominal tenderness.  Musculoskeletal: Normal range of motion.        General: No deformity.  Skin:    General: Skin is warm and dry.     Findings: No  erythema or rash.  Neurological:     Mental Status: He is alert and oriented to person, place, and time.     Cranial Nerves: No cranial nerve deficit.     Coordination: Coordination normal.  Psychiatric:        Behavior: Behavior normal.        Thought Content: Thought content normal.      CMP Latest Ref Rng & Units 09/06/2019  Glucose 70 - 99 mg/dL 163(H)  BUN 8 - 23 mg/dL 13  Creatinine 0.61 - 1.24 mg/dL 0.90  Sodium 135 - 145 mmol/L 131(L)  Potassium 3.5 - 5.1 mmol/L 3.4(L)  Chloride 98 - 111 mmol/L 104  CO2 22 - 32 mmol/L 19(L)  Calcium 8.9 - 10.3 mg/dL 7.9(L)  Total Protein 6.5 - 8.1 g/dL -  Total Bilirubin 0.3 - 1.2 mg/dL -  Alkaline Phos 38 - 126 U/L -  AST 15 - 41 U/L -  ALT 0 - 44 U/L -   CBC Latest Ref Rng & Units 11/12/2019  WBC 4.0 - 10.5 K/uL 10.2  Hemoglobin 13.0 - 17.0 g/dL 17.5(H)  Hematocrit 39.0 - 52.0 % 51.9  Platelets 150 - 400 K/uL 269   RADIOGRAPHIC STUDIES: I have personally reviewed the radiological images as listed and agreed with the findings in the report. Dg Chest 2 View  Result Date: 09/05/2019 CLINICAL DATA:  Elevated white count with tachycardia EXAM: CHEST - 2 VIEW COMPARISON:  None. FINDINGS: The heart size and mediastinal contours are within normal limits. Both lungs are clear. The visualized skeletal structures are unremarkable. IMPRESSION: No active cardiopulmonary disease. Electronically Signed   By: Donavan Foil M.D.   On: 09/05/2019 13:42   Ct Renal Stone Study  Result Date: 09/05/2019 CLINICAL DATA:  Fever, leukocytosis, history of recurrent UTIs EXAM: CT ABDOMEN AND PELVIS WITHOUT CONTRAST TECHNIQUE: Multidetector CT imaging of the abdomen and pelvis was performed following the standard protocol without IV contrast. COMPARISON:  10/18/2017 FINDINGS: Lower chest: Scarring/atelectasis at the lung bases. Hepatobiliary: Hepatic steatosis. Gallbladder is unremarkable. No intrahepatic or extrahepatic ductal dilatation. Pancreas: Within normal  limits. Spleen: Within normal limits. Adrenals/Urinary Tract: Adrenal glands are  within normal limits. Two left lower pole renal cysts, including a dominant 6.0 cm cyst in the medial left lower kidney with peripheral rim calcification (series 2/image 46), benign (Bosniak II). 7 mm nonobstructing left lower pole renal calculus. Right kidney is within normal limits. No ureteral or bladder calculi.  No hydronephrosis. Bladder is mildly thick-walled posteriorly but underdistended. Stomach/Bowel: Stomach is within normal limits. No evidence of bowel obstruction. Normal appendix (series 2/image 43). Vascular/Lymphatic: No evidence of abdominal aortic aneurysm. Atherosclerotic calcifications of the abdominal aorta and branch vessels. No suspicious abdominopelvic lymphadenopathy. Reproductive: Prostatomegaly, with mild enlargement of the central gland. Mild inflammatory changes along the prostate and seminal vesicles (series 2/image 80), nonspecific. This appearance may be related to the patient's history of recurrent UTIs, although prostatitis is not excluded. No evidence of prostatic abscess on CT. Other: No abdominopelvic ascites. Small fat containing periumbilical hernia (series 2/image 63). Musculoskeletal: Visualized osseous structures are within normal limits. IMPRESSION: 7 mm nonobstructing left lower pole renal calculus. No hydronephrosis. Prostatomegaly, suggesting BPH. Mild inflammatory changes along the prostate and seminal vesicles at least raises the possibility of prostatitis. No evidence of prostatic abscess on CT. Mildly thick-walled bladder, although underdistended. 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 Additional ancillary findings as above. Electronically Signed   By: Julian Hy M.D.   On: 09/05/2019 19:09     Assessment and plan- Patient is a 68 y.o. male with history of  secondary polycythemia likely secondary to obstructive sleep apnea   1. Polycythemia, secondary   2. OSA (obstructive sleep apnea)    #Secondary polycythemia likely due to OSA. Labs reviewed and discussed with patient. Hemoglobin 17.5, hematocrit 51.9. I recommend patient to proceed with phlebotomy as hematocrit is above 50. He agrees with the plan.  He also has some questions about polycythemia and I answered all his questions.  OSA on CPAP machine.  I recommend patient continue follow-up with primary care physician and pulmonology . Orders Placed This Encounter  Procedures  . CBC with Differential    Standing Status:   Future    Standing Expiration Date:   11/11/2020  . Comprehensive metabolic panel    Standing Status:   Future    Standing Expiration Date:   11/11/2020  Follow-up lab MD 3 months plus minus phlebotomy.  We spent sufficient time to discuss many aspect of care, questions were answered to patient's satisfaction. Total face to face encounter time for this patient visit was 15 min. >50% of the time was  spent in counseling and coordination of care.     Earlie Server, MD, PhD Hematology Oncology [facility] Pager- [phone] 11/12/2019

## 2019-11-12 NOTE — Progress Notes (Signed)
8 weeks ago patient been treated recently for bacterial UTI with a recent hospitalization and is feeling fine now.

## 2019-12-29 ENCOUNTER — Other Ambulatory Visit: Payer: Self-pay | Admitting: Urology

## 2019-12-29 DIAGNOSIS — N401 Enlarged prostate with lower urinary tract symptoms: Secondary | ICD-10-CM

## 2020-01-01 ENCOUNTER — Telehealth: Payer: Self-pay | Admitting: Urology

## 2020-01-01 NOTE — Telephone Encounter (Signed)
Pt requests a refill for Tadalafil 5mg , sent to [facility].

## 2020-01-01 NOTE — Telephone Encounter (Signed)
RX sent

## 2020-02-12 ENCOUNTER — Inpatient Hospital Stay: Payer: Medicare Other

## 2020-02-12 ENCOUNTER — Other Ambulatory Visit: Payer: Medicare HMO

## 2020-02-12 ENCOUNTER — Other Ambulatory Visit: Payer: Self-pay

## 2020-02-12 ENCOUNTER — Inpatient Hospital Stay: Payer: Medicare Other | Attending: Oncology | Admitting: Oncology

## 2020-02-12 ENCOUNTER — Encounter: Payer: Self-pay | Admitting: Oncology

## 2020-02-12 ENCOUNTER — Inpatient Hospital Stay: Payer: Medicare Other | Attending: Oncology

## 2020-02-12 VITALS — BP 129/82 | HR 103 | Temp 96.9°F | Resp 18 | Wt 249.1 lb

## 2020-02-12 VITALS — BP 125/84 | HR 108

## 2020-02-12 DIAGNOSIS — Z7984 Long term (current) use of oral hypoglycemic drugs: Secondary | ICD-10-CM | POA: Diagnosis not present

## 2020-02-12 DIAGNOSIS — D751 Secondary polycythemia: Secondary | ICD-10-CM

## 2020-02-12 DIAGNOSIS — G4730 Sleep apnea, unspecified: Secondary | ICD-10-CM | POA: Insufficient documentation

## 2020-02-12 DIAGNOSIS — Z87442 Personal history of urinary calculi: Secondary | ICD-10-CM | POA: Diagnosis not present

## 2020-02-12 DIAGNOSIS — Z79899 Other long term (current) drug therapy: Secondary | ICD-10-CM | POA: Diagnosis not present

## 2020-02-12 DIAGNOSIS — E119 Type 2 diabetes mellitus without complications: Secondary | ICD-10-CM | POA: Insufficient documentation

## 2020-02-12 DIAGNOSIS — M129 Arthropathy, unspecified: Secondary | ICD-10-CM | POA: Insufficient documentation

## 2020-02-12 DIAGNOSIS — G4733 Obstructive sleep apnea (adult) (pediatric): Secondary | ICD-10-CM

## 2020-02-12 DIAGNOSIS — R5383 Other fatigue: Secondary | ICD-10-CM | POA: Diagnosis not present

## 2020-02-12 DIAGNOSIS — F419 Anxiety disorder, unspecified: Secondary | ICD-10-CM | POA: Diagnosis not present

## 2020-02-12 LAB — CBC WITH DIFFERENTIAL/PLATELET
Abs Immature Granulocytes: 0.04 10*3/uL (ref 0.00–0.07)
Basophils Absolute: 0.1 10*3/uL (ref 0.0–0.1)
Basophils Relative: 1 %
Eosinophils Absolute: 0.2 10*3/uL (ref 0.0–0.5)
Eosinophils Relative: 2 %
HCT: 56 % — ABNORMAL HIGH (ref 39.0–52.0)
Hemoglobin: 18.4 g/dL — ABNORMAL HIGH (ref 13.0–17.0)
Immature Granulocytes: 0 %
Lymphocytes Relative: 31 %
Lymphs Abs: 3.1 10*3/uL (ref 0.7–4.0)
MCH: 27.3 pg (ref 26.0–34.0)
MCHC: 32.9 g/dL (ref 30.0–36.0)
MCV: 83 fL (ref 80.0–100.0)
Monocytes Absolute: 0.9 10*3/uL (ref 0.1–1.0)
Monocytes Relative: 9 %
Neutro Abs: 5.8 10*3/uL (ref 1.7–7.7)
Neutrophils Relative %: 57 %
Platelets: 263 10*3/uL (ref 150–400)
RBC: 6.75 MIL/uL — ABNORMAL HIGH (ref 4.22–5.81)
RDW: 14.3 % (ref 11.5–15.5)
WBC: 10 10*3/uL (ref 4.0–10.5)
nRBC: 0 % (ref 0.0–0.2)

## 2020-02-12 LAB — COMPREHENSIVE METABOLIC PANEL
ALT: 22 U/L (ref 0–44)
AST: 19 U/L (ref 15–41)
Albumin: 4.2 g/dL (ref 3.5–5.0)
Alkaline Phosphatase: 65 U/L (ref 38–126)
Anion gap: 12 (ref 5–15)
BUN: 15 mg/dL (ref 8–23)
CO2: 24 mmol/L (ref 22–32)
Calcium: 9.6 mg/dL (ref 8.9–10.3)
Chloride: 100 mmol/L (ref 98–111)
Creatinine, Ser: 1.11 mg/dL (ref 0.61–1.24)
GFR calc Af Amer: >60 mL/min (ref >60)
GFR calc non Af Amer: >60 mL/min (ref >60)
Glucose, Bld: 194 mg/dL — ABNORMAL HIGH (ref 70–99)
Potassium: 4.2 mmol/L (ref 3.5–5.1)
Sodium: 136 mmol/L (ref 135–145)
Total Bilirubin: 0.8 mg/dL (ref 0.3–1.2)
Total Protein: 7.5 g/dL (ref 6.5–8.1)

## 2020-02-12 NOTE — Progress Notes (Signed)
HCT: 56.  Therapeutic phlebotomy performed per MD order removing 500cc using 20 gauge PIV in rt AC. Pt tolerated procedure well. Drink provided, pt declines snack. Pt denies any concerns at this time. Pt and VS stable at discharge.

## 2020-02-12 NOTE — Progress Notes (Signed)
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  BCR able and Jak 2 mutation testing was negative in 2011.  He does have a history of obstructive sleep apnea and uses CPAP.  He is [name] patient.  He was initially getting phlebotomy every 2 weeks but now gets it every 2 months if need be.  His threshold for phlebotomy is a hematocrit of greater than 47.  Patient does report improvement in his fatigue for a few days after he gets a phlebotomy   patient previously follows up with [name]. He switches care to me on 01/15/2019.  06/15/2016 JAK2 V617F negative, Exons 12-15 mutation negative.  06/15/2017 Erythropoietin 12.8.  05/15/2019 further testing including MPL, carl mutation which both are negative  Interval history-  69 y.o. male with a history of erythrocytosis present to follow-up. Patient reports no new complaints. He has been compliant with his CPAP machine. Chronic fatigue is baseline.   Review of systems- Review of Systems  Constitutional: Positive for weight loss. Negative for chills, fever and malaise/fatigue.  HENT: Negative for sore throat.   Eyes: Negative for redness.  Respiratory: Negative for cough, shortness of breath and wheezing.   Cardiovascular: Negative for chest pain, palpitations and leg swelling.  Gastrointestinal: Negative for abdominal pain, blood in stool, nausea and vomiting.  Genitourinary: Negative for dysuria.  Musculoskeletal: Negative for myalgias.  Skin: Negative for rash.  Neurological:  Negative for dizziness, tingling and tremors.  Endo/Heme/Allergies: Does not bruise/bleed easily.  Psychiatric/Behavioral: Negative for hallucinations.       Allergies  Allergen Reactions  . Sertraline Hcl Other (See Comments)    Vivid dreams     Past Medical History:  Diagnosis Date  . Anxiety   . Arthritis   . Diabetes mellitus without complication ([place])   . Erythrocytosis 05/18/2015  . Erythrocytosis   . History of kidney stones   . 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 Polycythemia   . Sleep apnea    USES CPAP     Past Surgical History:  Procedure Laterality Date  . BLADDER STONE REMOVAL    . cataracts Bilateral   . COLONOSCOPY N/A 06/02/2015   Procedure: COLONOSCOPY;  Surgeon: [name], MD;  Location: [facility] ENDOSCOPY;  Service: Endoscopy;  Laterality: N/A;  . CYSTOSCOPY W/ RETROGRADES Left 11/01/2017   Procedure: CYSTOSCOPY WITH RETROGRADE PYELOGRAM;  Surgeon: [name], MD;  Location: [facility] ORS;  Service: Urology;  Laterality: Left;  . CYSTOSCOPY WITH STENT PLACEMENT Left 11/01/2017   Procedure: CYSTOSCOPY WITH STENT PLACEMENT;  Surgeon: [name], MD;  Location: [facility] ORS;  Service: Urology;  Laterality: Left;    Social History   Socioeconomic History  . Marital status: Married    Spouse name: Not on file  . Number of children: Not on file  . Years of education: Not on file  . Highest education level: Not on file  Occupational History  . Not on file  Tobacco Use  . Smoking status: Never  Smoker  . Smokeless tobacco: Never Used  Substance and Sexual Activity  . Alcohol use: No  . Drug use: No  . Sexual activity: Not on file  Other Topics Concern  . Not on file  Social History Narrative  . Not on file   Social Determinants of Health   Financial Resource Strain:   . Difficulty of Paying Living Expenses: Not on file  Food Insecurity:   . Worried About  Charity fundraiser in the Last Year: Not on file  . Ran Out of Food in the Last Year: Not on file  Transportation Needs:   . Lack of Transportation (Medical): Not on file  . Lack of Transportation (Non-Medical): Not on file  Physical Activity:   . Days of Exercise per Week: Not on file  . Minutes of Exercise per Session: Not on file  Stress:   . Feeling of Stress : Not on file  Social Connections:   . Frequency of Communication with Friends and Family: Not on file  . Frequency of Social Gatherings with Friends and Family: Not on file  . Attends Religious Services: Not on file  . Active Member of Clubs or Organizations: Not on file  . Attends Archivist Meetings: Not on file  . Marital Status: Not on file  Intimate Partner Violence:   . Fear of Current or Ex-Partner: Not on file  . Emotionally Abused: Not on file  . Physically Abused: Not on file  . Sexually Abused: Not on file    Family History  Problem Relation Age of Onset  . Heart disease Mother   . Diabetes Father      Current Outpatient Medications:  .  HYDROcodone-acetaminophen (NORCO/VICODIN) 5-325 MG tablet, Take 1 tablet by mouth 2 (two) times daily as needed for moderate pain. Reported on 06/15/2016 (Patient taking differently: Take 1 tablet by mouth every 6 (six) hours as needed for moderate pain. ), Disp: 30 tablet, Rfl: 0 .  insulin NPH Human (NOVOLIN N) 100 UNIT/ML injection, Inject into the skin. 20 units every evening, Disp: , Rfl:  .  lisinopril (PRINIVIL,ZESTRIL) 2.5 MG tablet, Take 2.5 mg by mouth at bedtime., Disp: , Rfl:  .  LORazepam (ATIVAN) 1 MG tablet, Take 1 tablet (1 mg total) by mouth daily. (Patient taking differently: Take 1 mg by mouth 2 (two) times daily. ), Disp: 15 tablet, Rfl: 0 .  metFORMIN (GLUCOPHAGE-XR) 500 MG 24 hr tablet, Take 1,000 mg by mouth 2 (two) times daily., Disp: , Rfl:  .  pravastatin (PRAVACHOL) 10 MG tablet, Take 10 mg by mouth at bedtime. , Disp: , Rfl:  .   Semaglutide,0.25 or 0.5MG /DOS, 2 MG/1.5ML SOPN, Inject 0.25 mg into the skin every 7 (seven) days. , Disp: , Rfl:  .  tadalafil (CIALIS) 5 MG tablet, TAKE ONE TABLET BY MOUTH DAILY, Disp: 90 tablet, Rfl: 0 .  terbinafine (LAMISIL) 250 MG tablet, Take 250 mg by mouth at bedtime., Disp: , Rfl:   Current Facility-Administered Medications:  .  lidocaine (XYLOCAINE) 2 % jelly 1 application, 1 application, Urethral, Once, Stoioff, Scott C, MD  Physical exam: ECOG 1 Vitals:   02/12/20 1347  BP: 129/82  Pulse: (!) 103  Resp: 18  Temp: (!) 96.9 F (36.1 C)  Weight: 249 lb 1.6 oz (113 kg)   Physical Exam Constitutional:      General: He is not in acute distress.    Appearance: Normal appearance. He is obese.  HENT:  Head: Normocephalic and atraumatic.  Eyes:     General: No scleral icterus.    Pupils: Pupils are equal, round, and reactive to light.  Cardiovascular:     Rate and Rhythm: Normal rate and regular rhythm.     Heart sounds: Normal heart sounds.  Pulmonary:     Effort: Pulmonary effort is normal. No respiratory distress.     Breath sounds: Normal breath sounds. No wheezing.  Abdominal:     General: Bowel sounds are normal. There is no distension.     Palpations: Abdomen is soft. There is no mass.     Tenderness: There is no abdominal tenderness.  Musculoskeletal:        General: No deformity. Normal range of motion.     Cervical back: Normal range of motion and neck supple.  Skin:    General: Skin is warm and dry.     Findings: No erythema or rash.  Neurological:     Mental Status: He is alert and oriented to person, place, and time. Mental status is at baseline.     Cranial Nerves: No cranial nerve deficit.     Coordination: Coordination normal.  Psychiatric:        Mood and Affect: Mood normal.        Behavior: Behavior normal.        Thought Content: Thought content normal.      CMP Latest Ref Rng & Units 02/12/2020  Glucose 70 - 99 mg/dL 194(H)  BUN 8 - 23  mg/dL 15  Creatinine 0.61 - 1.24 mg/dL 1.11  Sodium 135 - 145 mmol/L 136  Potassium 3.5 - 5.1 mmol/L 4.2  Chloride 98 - 111 mmol/L 100  CO2 22 - 32 mmol/L 24  Calcium 8.9 - 10.3 mg/dL 9.6  Total Protein 6.5 - 8.1 g/dL 7.5  Total Bilirubin 0.3 - 1.2 mg/dL 0.8  Alkaline Phos 38 - 126 U/L 65  AST 15 - 41 U/L 19  ALT 0 - 44 U/L 22   CBC Latest Ref Rng & Units 02/12/2020  WBC 4.0 - 10.5 K/uL 10.0  Hemoglobin 13.0 - 17.0 g/dL 18.4(H)  Hematocrit 39.0 - 52.0 % 56.0(H)  Platelets 150 - 400 K/uL 263   RADIOGRAPHIC STUDIES: I have personally reviewed the radiological images as listed and agreed with the findings in the report. No results found.   Assessment and plan- Patient is a 69 y.o. male with history of secondary polycythemia likely secondary to obstructive sleep apnea   1. Erythrocytosis   2. OSA (obstructive sleep apnea)    #Secondary polycythemia likely due to OSA. Labs reviewed and discussed with patient. Hemoglobin has further increased to 18.4, hematocrit 56. Discussed with patient about therapeutic phlebotomy if hematocrit is above 50. He agrees with the plan. Plan phlebotomy 500 cc x 1 today. Repeat H&H in 2 weeks in 4 weeks +/-phlebotomy.  I advised patient to discuss with primary care provider/pulmonology to see if his CPAP machine can be evaluated and adjusted. Worsening of secondary erythrocytosis possible secondary to less optimization of CPAP parameters or mask fitting.   . Orders Placed This Encounter  Procedures  . Hemoglobin and Hematocrit, Blood    Standing Status:   Standing    Number of Occurrences:   2    Standing Expiration Date:   02/11/2021  . CBC with Differential/Platelet    Standing Status:   Future    Standing Expiration Date:   02/11/2021  Follow-up lab MD 3 months plus minus phlebotomy.  We spent sufficient time to discuss many aspect of care, questions were answered to patient's satisfaction.     Earlie Server, MD, PhD Hematology  Oncology [facility] Pager- [phone] 02/12/2020

## 2020-02-12 NOTE — Progress Notes (Signed)
Patient does not offer any problems today.  

## 2020-02-25 ENCOUNTER — Other Ambulatory Visit: Payer: Self-pay

## 2020-02-26 ENCOUNTER — Inpatient Hospital Stay: Payer: Medicare Other | Attending: Oncology

## 2020-02-26 ENCOUNTER — Other Ambulatory Visit: Payer: Self-pay

## 2020-02-26 ENCOUNTER — Inpatient Hospital Stay: Payer: Medicare Other

## 2020-02-26 VITALS — BP 116/73 | HR 90 | Temp 98.0°F | Resp 18

## 2020-02-26 DIAGNOSIS — D751 Secondary polycythemia: Secondary | ICD-10-CM

## 2020-02-26 DIAGNOSIS — Z79899 Other long term (current) drug therapy: Secondary | ICD-10-CM | POA: Insufficient documentation

## 2020-02-26 DIAGNOSIS — D473 Essential (hemorrhagic) thrombocythemia: Secondary | ICD-10-CM | POA: Diagnosis not present

## 2020-02-26 LAB — HEMOGLOBIN AND HEMATOCRIT, BLOOD
HCT: 50.3 % (ref 39.0–52.0)
Hemoglobin: 16.5 g/dL (ref 13.0–17.0)

## 2020-02-27 ENCOUNTER — Other Ambulatory Visit: Payer: Medicare Other

## 2020-03-11 ENCOUNTER — Inpatient Hospital Stay: Payer: Medicare Other

## 2020-03-11 DIAGNOSIS — D473 Essential (hemorrhagic) thrombocythemia: Secondary | ICD-10-CM | POA: Diagnosis not present

## 2020-03-11 DIAGNOSIS — D751 Secondary polycythemia: Secondary | ICD-10-CM

## 2020-03-11 LAB — HEMOGLOBIN AND HEMATOCRIT, BLOOD
HCT: 47.7 % (ref 39.0–52.0)
Hemoglobin: 16.2 g/dL (ref 13.0–17.0)

## 2020-03-12 ENCOUNTER — Other Ambulatory Visit: Payer: Medicare Other

## 2020-03-29 ENCOUNTER — Other Ambulatory Visit: Payer: Self-pay | Admitting: Urology

## 2020-03-29 DIAGNOSIS — N401 Enlarged prostate with lower urinary tract symptoms: Secondary | ICD-10-CM

## 2020-04-07 ENCOUNTER — Telehealth: Payer: Self-pay | Admitting: Urology

## 2020-04-07 NOTE — Telephone Encounter (Signed)
Pt is calling he needs a refill on rx tabalfil 5 mg send to [facility] he thinks it may need prior auth. Pt would like a call regarding his refill

## 2020-04-07 NOTE — Telephone Encounter (Signed)
If the medication needs a prior auth . Notified patient that we have not had one come in yet

## 2020-05-13 ENCOUNTER — Inpatient Hospital Stay: Payer: Medicare Other | Attending: Oncology

## 2020-05-13 ENCOUNTER — Encounter: Payer: Self-pay | Admitting: Oncology

## 2020-05-13 ENCOUNTER — Other Ambulatory Visit: Payer: Self-pay

## 2020-05-13 ENCOUNTER — Inpatient Hospital Stay (HOSPITAL_BASED_OUTPATIENT_CLINIC_OR_DEPARTMENT_OTHER): Payer: Medicare Other | Admitting: Oncology

## 2020-05-13 ENCOUNTER — Inpatient Hospital Stay: Payer: Medicare Other

## 2020-05-13 VITALS — BP 120/79 | HR 105 | Temp 98.1°F | Resp 18 | Wt 245.0 lb

## 2020-05-13 VITALS — BP 126/82 | HR 94 | Resp 20

## 2020-05-13 DIAGNOSIS — J984 Other disorders of lung: Secondary | ICD-10-CM | POA: Diagnosis not present

## 2020-05-13 DIAGNOSIS — D751 Secondary polycythemia: Secondary | ICD-10-CM

## 2020-05-13 DIAGNOSIS — G4733 Obstructive sleep apnea (adult) (pediatric): Secondary | ICD-10-CM | POA: Diagnosis not present

## 2020-05-13 LAB — CBC WITH DIFFERENTIAL/PLATELET
Abs Immature Granulocytes: 0.02 10*3/uL (ref 0.00–0.07)
Basophils Absolute: 0.1 10*3/uL (ref 0.0–0.1)
Basophils Relative: 1 %
Eosinophils Absolute: 0.2 10*3/uL (ref 0.0–0.5)
Eosinophils Relative: 2 %
HCT: 51.7 % (ref 39.0–52.0)
Hemoglobin: 17.3 g/dL — ABNORMAL HIGH (ref 13.0–17.0)
Immature Granulocytes: 0 %
Lymphocytes Relative: 32 %
Lymphs Abs: 3 10*3/uL (ref 0.7–4.0)
MCH: 26.9 pg (ref 26.0–34.0)
MCHC: 33.5 g/dL (ref 30.0–36.0)
MCV: 80.4 fL (ref 80.0–100.0)
Monocytes Absolute: 0.8 10*3/uL (ref 0.1–1.0)
Monocytes Relative: 9 %
Neutro Abs: 5.4 10*3/uL (ref 1.7–7.7)
Neutrophils Relative %: 56 %
Platelets: 252 10*3/uL (ref 150–400)
RBC: 6.43 MIL/uL — ABNORMAL HIGH (ref 4.22–5.81)
RDW: 14.3 % (ref 11.5–15.5)
WBC: 9.5 10*3/uL (ref 4.0–10.5)
nRBC: 0 % (ref 0.0–0.2)

## 2020-05-13 NOTE — Progress Notes (Signed)
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  BCR able and Jak 2 mutation testing was negative in 2011.  He does have a history of obstructive sleep apnea and uses CPAP.  He is [name] patient.  He was initially getting phlebotomy every 2 weeks but now gets it every 2 months if need be.  His threshold for phlebotomy is a hematocrit of greater than 47.  Patient does report improvement in his fatigue for a few days after he gets a phlebotomy   patient previously follows up with [name]. He switches care to me on 01/15/2019.  06/15/2016 JAK2 V617F negative, Exons 12-15 mutation negative.  06/15/2017 Erythropoietin 12.8.  05/15/2019 further testing including MPL, carl mutation which both are negative  Interval history-  69 y.o. male with a history of erythrocytosis present to follow-up.  Patient reports no new complaints. He reports that he is receiving some new equipment for CPAP. Denies any night sweating, fever, chills. He lost 4 pounds since last visit. Patient was started on Ozempic and he attributes his weight loss to the start of new diabetic medication.   Review of systems- Review of Systems  Constitutional: Positive for weight loss. Negative for chills, fever and malaise/fatigue.  HENT: Negative for sore throat.   Eyes: Negative for redness.  Respiratory: Negative for cough, shortness of breath and wheezing.   Cardiovascular: Negative for chest pain, palpitations and leg swelling.  Gastrointestinal: Negative for  abdominal pain, blood in stool, nausea and vomiting.  Genitourinary: Negative for dysuria.  Musculoskeletal: Negative for myalgias.  Skin: Negative for rash.  Neurological: Negative for dizziness, tingling and tremors.  Endo/Heme/Allergies: Does not bruise/bleed easily.  Psychiatric/Behavioral: Negative for hallucinations.       Allergies  Allergen Reactions  . Sertraline Hcl Other (See Comments)    Vivid dreams     Past Medical History:  Diagnosis Date  . Anxiety   . Arthritis   . Diabetes mellitus without complication ([place])   . Erythrocytosis 05/18/2015  . Erythrocytosis   . History of kidney stones   . 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 Polycythemia   . Sleep apnea    USES CPAP     Past Surgical History:  Procedure Laterality Date  . BLADDER STONE REMOVAL    . cataracts Bilateral   . COLONOSCOPY N/A 06/02/2015   Procedure: COLONOSCOPY;  Surgeon: [name], MD;  Location: [facility] ENDOSCOPY;  Service: Endoscopy;  Laterality: N/A;  . CYSTOSCOPY W/ RETROGRADES Left 11/01/2017   Procedure: CYSTOSCOPY WITH RETROGRADE PYELOGRAM;  Surgeon: [name], MD;  Location: [facility] ORS;  Service: Urology;  Laterality: Left;  . CYSTOSCOPY WITH STENT PLACEMENT Left 11/01/2017   Procedure: CYSTOSCOPY WITH STENT PLACEMENT;  Surgeon: [name], MD;  Location: [facility] ORS;  Service: Urology;  Laterality: Left;    Social History   Socioeconomic History  . Marital status: Married    Spouse name: Not on file  . Number of children: Not on file  .  Years of education: Not on file  . Highest education level: Not on file  Occupational History  . Not on file  Tobacco Use  . Smoking status: Never Smoker  . Smokeless tobacco: Never Used  Substance and Sexual Activity  . Alcohol use: No  . Drug use: No  . Sexual activity: Not on file  Other Topics Concern  . Not on file  Social History  Narrative  . Not on file   Social Determinants of Health   Financial Resource Strain:   . Difficulty of Paying Living Expenses:   Food Insecurity:   . Worried About Charity fundraiser in the Last Year:   . Arboriculturist in the Last Year:   Transportation Needs:   . Film/video editor (Medical):   Marland Kitchen Lack of Transportation (Non-Medical):   Physical Activity:   . Days of Exercise per Week:   . Minutes of Exercise per Session:   Stress:   . Feeling of Stress :   Social Connections:   . Frequency of Communication with Friends and Family:   . Frequency of Social Gatherings with Friends and Family:   . Attends Religious Services:   . Active Member of Clubs or Organizations:   . Attends Archivist Meetings:   Marland Kitchen Marital Status:   Intimate Partner Violence:   . Fear of Current or Ex-Partner:   . Emotionally Abused:   Marland Kitchen Physically Abused:   . Sexually Abused:     Family History  Problem Relation Age of Onset  . Heart disease Mother   . Diabetes Father      Current Outpatient Medications:  .  HYDROcodone-acetaminophen (NORCO/VICODIN) 5-325 MG tablet, Take 1 tablet by mouth 2 (two) times daily as needed for moderate pain. Reported on 06/15/2016 (Patient taking differently: Take 1 tablet by mouth every 6 (six) hours as needed for moderate pain. ), Disp: 30 tablet, Rfl: 0 .  insulin NPH Human (NOVOLIN N) 100 UNIT/ML injection, Inject 25 Units into the skin at bedtime. 25 units every evening, Disp: , Rfl:  .  lisinopril (PRINIVIL,ZESTRIL) 2.5 MG tablet, Take 2.5 mg by mouth at bedtime., Disp: , Rfl:  .  LORazepam (ATIVAN) 1 MG tablet, Take 1 tablet (1 mg total) by mouth daily. (Patient taking differently: Take 1 mg by mouth 2 (two) times daily. ), Disp: 15 tablet, Rfl: 0 .  metFORMIN (GLUCOPHAGE-XR) 500 MG 24 hr tablet, Take 1,000 mg by mouth 2 (two) times daily., Disp: , Rfl:  .  pravastatin (PRAVACHOL) 10 MG tablet, Take 10 mg by mouth at bedtime. , Disp: , Rfl:  .   Semaglutide,0.25 or 0.5MG /DOS, 2 MG/1.5ML SOPN, Inject 0.25 mg into the skin every 7 (seven) days. , Disp: , Rfl:  .  tadalafil (CIALIS) 5 MG tablet, TAKE ONE TABLET BY MOUTH DAILY, Disp: 90 tablet, Rfl: 0 .  terbinafine (LAMISIL) 250 MG tablet, Take 250 mg by mouth at bedtime., Disp: , Rfl:   Current Facility-Administered Medications:  .  lidocaine (XYLOCAINE) 2 % jelly 1 application, 1 application, Urethral, Once, Stoioff, Scott C, MD  Physical exam: ECOG 1 Vitals:   05/13/20 1317  BP: 120/79  Pulse: (!) 105  Resp: 18  Temp: 98.1 F (36.7 C)  Weight: 245 lb (111.1 kg)   Physical Exam Constitutional:      General: He is not in acute distress.    Appearance: Normal appearance. He is obese.  HENT:     Head: Normocephalic and  atraumatic.  Eyes:     General: No scleral icterus.    Pupils: Pupils are equal, round, and reactive to light.  Cardiovascular:     Rate and Rhythm: Normal rate and regular rhythm.     Heart sounds: Normal heart sounds.  Pulmonary:     Effort: Pulmonary effort is normal. No respiratory distress.     Breath sounds: Normal breath sounds. No wheezing.  Abdominal:     General: Bowel sounds are normal. There is no distension.     Palpations: Abdomen is soft. There is no mass.     Tenderness: There is no abdominal tenderness.  Musculoskeletal:        General: No deformity. Normal range of motion.     Cervical back: Normal range of motion and neck supple.  Skin:    General: Skin is warm and dry.     Findings: No erythema or rash.  Neurological:     Mental Status: He is alert and oriented to person, place, and time. Mental status is at baseline.     Cranial Nerves: No cranial nerve deficit.     Coordination: Coordination normal.  Psychiatric:        Mood and Affect: Mood normal.      CMP Latest Ref Rng & Units 02/12/2020  Glucose 70 - 99 mg/dL 194(H)  BUN 8 - 23 mg/dL 15  Creatinine 0.61 - 1.24 mg/dL 1.11  Sodium 135 - 145 mmol/L 136  Potassium 3.5 -  5.1 mmol/L 4.2  Chloride 98 - 111 mmol/L 100  CO2 22 - 32 mmol/L 24  Calcium 8.9 - 10.3 mg/dL 9.6  Total Protein 6.5 - 8.1 g/dL 7.5  Total Bilirubin 0.3 - 1.2 mg/dL 0.8  Alkaline Phos 38 - 126 U/L 65  AST 15 - 41 U/L 19  ALT 0 - 44 U/L 22   CBC Latest Ref Rng & Units 05/13/2020  WBC 4.0 - 10.5 K/uL 9.5  Hemoglobin 13.0 - 17.0 g/dL 17.3(H)  Hematocrit 39.0 - 52.0 % 51.7  Platelets 150 - 400 K/uL 252   RADIOGRAPHIC STUDIES: I have personally reviewed the radiological images as listed and agreed with the findings in the report. No results found.   Assessment and plan- Patient is a 69 y.o. male with history of secondary polycythemia likely secondary to obstructive sleep apnea   1. OSA (obstructive sleep apnea)   2. Secondary erythrocytosis    #Secondary polycythemia likely due to OSA. Labs reviewed and discussed with patient. Hematocrit is above 50 today. Plan to proceed with phlebotomy 500 cc x 1 today. Repeat blood work in 6 weeks +/-phlebotomy.  Follow-up in 12 weeks for reevaluation. Encourage patient to follow-up with primary care provider and pulmonologist for optimization of CPAP and paramediastinal mask fitting.  Other etiologies : Chronic lung disease/oxygen exchange problems. Recommend patient to obtain chest x-ray.  He declined.  Follow-up lab MD 3 months plus minus phlebotomy.  We spent sufficient time to discuss many aspect of care, questions were answered to patient's satisfaction.     Earlie Server, MD, PhD Hematology Oncology [facility] Pager- [phone] 05/13/2020

## 2020-05-13 NOTE — Progress Notes (Signed)
Patient here for follow up. No new concerns voiced.  °

## 2020-06-16 ENCOUNTER — Other Ambulatory Visit: Payer: Self-pay

## 2020-06-16 DIAGNOSIS — D751 Secondary polycythemia: Secondary | ICD-10-CM

## 2020-06-17 ENCOUNTER — Other Ambulatory Visit: Payer: Self-pay

## 2020-06-17 ENCOUNTER — Inpatient Hospital Stay: Payer: Medicare Other | Attending: Oncology

## 2020-06-17 DIAGNOSIS — D473 Essential (hemorrhagic) thrombocythemia: Secondary | ICD-10-CM | POA: Diagnosis present

## 2020-06-17 DIAGNOSIS — D751 Secondary polycythemia: Secondary | ICD-10-CM

## 2020-06-17 LAB — CBC WITH DIFFERENTIAL/PLATELET
Abs Immature Granulocytes: 0.05 10*3/uL (ref 0.00–0.07)
Basophils Absolute: 0.1 10*3/uL (ref 0.0–0.1)
Basophils Relative: 1 %
Eosinophils Absolute: 0.2 10*3/uL (ref 0.0–0.5)
Eosinophils Relative: 2 %
HCT: 50.2 % (ref 39.0–52.0)
Hemoglobin: 16.8 g/dL (ref 13.0–17.0)
Immature Granulocytes: 1 %
Lymphocytes Relative: 29 %
Lymphs Abs: 3.1 10*3/uL (ref 0.7–4.0)
MCH: 26.6 pg (ref 26.0–34.0)
MCHC: 33.5 g/dL (ref 30.0–36.0)
MCV: 79.6 fL — ABNORMAL LOW (ref 80.0–100.0)
Monocytes Absolute: 0.9 10*3/uL (ref 0.1–1.0)
Monocytes Relative: 8 %
Neutro Abs: 6.6 10*3/uL (ref 1.7–7.7)
Neutrophils Relative %: 59 %
Platelets: 296 10*3/uL (ref 150–400)
RBC: 6.31 MIL/uL — ABNORMAL HIGH (ref 4.22–5.81)
RDW: 14.6 % (ref 11.5–15.5)
WBC: 10.9 10*3/uL — ABNORMAL HIGH (ref 4.0–10.5)
nRBC: 0 % (ref 0.0–0.2)

## 2020-06-24 ENCOUNTER — Other Ambulatory Visit: Payer: Medicare Other

## 2020-06-24 ENCOUNTER — Inpatient Hospital Stay: Payer: Medicare Other

## 2020-06-24 ENCOUNTER — Other Ambulatory Visit: Payer: Self-pay

## 2020-06-24 VITALS — BP 117/76 | HR 98 | Temp 98.4°F | Resp 20

## 2020-06-24 DIAGNOSIS — D751 Secondary polycythemia: Secondary | ICD-10-CM

## 2020-06-24 DIAGNOSIS — D473 Essential (hemorrhagic) thrombocythemia: Secondary | ICD-10-CM | POA: Diagnosis not present

## 2020-07-02 ENCOUNTER — Other Ambulatory Visit: Payer: Self-pay | Admitting: Urology

## 2020-07-02 DIAGNOSIS — N401 Enlarged prostate with lower urinary tract symptoms: Secondary | ICD-10-CM

## 2020-07-18 ENCOUNTER — Telehealth: Payer: Self-pay | Admitting: Urology

## 2020-07-18 NOTE — Telephone Encounter (Signed)
Pt called asking for a refill of tadalafil 5mg  to be sent to [facility]. Please advise.

## 2020-07-18 NOTE — Telephone Encounter (Signed)
rx sent on 07/02/2020

## 2020-08-05 ENCOUNTER — Inpatient Hospital Stay: Payer: Medicare Other | Attending: Oncology

## 2020-08-05 ENCOUNTER — Inpatient Hospital Stay (HOSPITAL_BASED_OUTPATIENT_CLINIC_OR_DEPARTMENT_OTHER): Payer: Medicare Other | Admitting: Oncology

## 2020-08-05 ENCOUNTER — Inpatient Hospital Stay: Payer: Medicare Other

## 2020-08-05 ENCOUNTER — Encounter: Payer: Self-pay | Admitting: Oncology

## 2020-08-05 ENCOUNTER — Other Ambulatory Visit: Payer: Self-pay

## 2020-08-05 VITALS — BP 117/74 | HR 96 | Temp 98.5°F | Resp 16 | Wt 249.3 lb

## 2020-08-05 DIAGNOSIS — D751 Secondary polycythemia: Secondary | ICD-10-CM | POA: Diagnosis not present

## 2020-08-05 DIAGNOSIS — F419 Anxiety disorder, unspecified: Secondary | ICD-10-CM | POA: Insufficient documentation

## 2020-08-05 DIAGNOSIS — M129 Arthropathy, unspecified: Secondary | ICD-10-CM | POA: Insufficient documentation

## 2020-08-05 DIAGNOSIS — G4730 Sleep apnea, unspecified: Secondary | ICD-10-CM | POA: Insufficient documentation

## 2020-08-05 DIAGNOSIS — Z87442 Personal history of urinary calculi: Secondary | ICD-10-CM | POA: Insufficient documentation

## 2020-08-05 DIAGNOSIS — G4733 Obstructive sleep apnea (adult) (pediatric): Secondary | ICD-10-CM | POA: Insufficient documentation

## 2020-08-05 DIAGNOSIS — Z79899 Other long term (current) drug therapy: Secondary | ICD-10-CM | POA: Insufficient documentation

## 2020-08-05 DIAGNOSIS — Z7984 Long term (current) use of oral hypoglycemic drugs: Secondary | ICD-10-CM | POA: Diagnosis not present

## 2020-08-05 DIAGNOSIS — N1330 Unspecified hydronephrosis: Secondary | ICD-10-CM | POA: Insufficient documentation

## 2020-08-05 DIAGNOSIS — E119 Type 2 diabetes mellitus without complications: Secondary | ICD-10-CM | POA: Insufficient documentation

## 2020-08-05 LAB — CBC WITH DIFFERENTIAL/PLATELET
Abs Immature Granulocytes: 0.02 10*3/uL (ref 0.00–0.07)
Basophils Absolute: 0.1 10*3/uL (ref 0.0–0.1)
Basophils Relative: 1 %
Eosinophils Absolute: 0.5 10*3/uL (ref 0.0–0.5)
Eosinophils Relative: 5 %
HCT: 46.3 % (ref 39.0–52.0)
Hemoglobin: 15.6 g/dL (ref 13.0–17.0)
Immature Granulocytes: 0 %
Lymphocytes Relative: 26 %
Lymphs Abs: 2.6 10*3/uL (ref 0.7–4.0)
MCH: 26.6 pg (ref 26.0–34.0)
MCHC: 33.7 g/dL (ref 30.0–36.0)
MCV: 78.9 fL — ABNORMAL LOW (ref 80.0–100.0)
Monocytes Absolute: 0.7 10*3/uL (ref 0.1–1.0)
Monocytes Relative: 7 %
Neutro Abs: 6.4 10*3/uL (ref 1.7–7.7)
Neutrophils Relative %: 61 %
Platelets: 265 10*3/uL (ref 150–400)
RBC: 5.87 MIL/uL — ABNORMAL HIGH (ref 4.22–5.81)
RDW: 14.3 % (ref 11.5–15.5)
WBC: 10.4 10*3/uL (ref 4.0–10.5)
nRBC: 0 % (ref 0.0–0.2)

## 2020-08-05 NOTE — Progress Notes (Signed)
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  BCR able and Jak 2 mutation testing was negative in 2011.  He does have a history of obstructive sleep apnea and uses CPAP.  He is [name] patient.  He was initially getting phlebotomy every 2 weeks but now gets it every 2 months if need be.  His threshold for phlebotomy is a hematocrit of greater than 47.  Patient does report improvement in his fatigue for a few days after he gets a phlebotomy   patient previously follows up with [name]. He switches care to me on 01/15/2019.  06/15/2016 JAK2 V617F negative, Exons 12-15 mutation negative.  06/15/2017 Erythropoietin 12.8.  05/15/2019 further testing including MPL, carl mutation which both are negative  Interval history-  69 y.o. male with a history of erythrocytosis present to follow-up.  Patient reports no new complaints. Patient reports feeling well.  He has received new parts of CPAP machine and has been compliant using it every night. He has no new complaints  Review of systems- Review of Systems  Constitutional: Negative for chills, fever, malaise/fatigue and weight loss.  HENT: Negative for sore throat.   Eyes: Negative for redness.  Respiratory: Negative for cough, shortness of breath and wheezing.   Cardiovascular: Negative for chest pain, palpitations and leg swelling.  Gastrointestinal: Negative for abdominal pain, blood in stool, nausea and vomiting.  Genitourinary: Negative for dysuria.  Musculoskeletal: Negative  for myalgias.  Skin: Negative for rash.  Neurological: Negative for dizziness, tingling and tremors.  Endo/Heme/Allergies: Does not bruise/bleed easily.  Psychiatric/Behavioral: Negative for hallucinations.       Allergies  Allergen Reactions  . Sertraline Hcl Other (See Comments)    Vivid dreams     Past Medical History:  Diagnosis Date  . Anxiety   . Arthritis   . Diabetes mellitus without complication ([place])   . Erythrocytosis 05/18/2015  . Erythrocytosis   . History of kidney stones   . 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 Polycythemia   . Sleep apnea    USES CPAP     Past Surgical History:  Procedure Laterality Date  . BLADDER STONE REMOVAL    . cataracts Bilateral   . COLONOSCOPY N/A 06/02/2015   Procedure: COLONOSCOPY;  Surgeon: [name], MD;  Location: [facility] ENDOSCOPY;  Service: Endoscopy;  Laterality: N/A;  . CYSTOSCOPY W/ RETROGRADES Left 11/01/2017   Procedure: CYSTOSCOPY WITH RETROGRADE PYELOGRAM;  Surgeon: [name], MD;  Location: [facility] ORS;  Service: Urology;  Laterality: Left;  . CYSTOSCOPY WITH STENT PLACEMENT Left 11/01/2017   Procedure: CYSTOSCOPY WITH STENT PLACEMENT;  Surgeon: [name], MD;  Location: [facility] ORS;  Service: Urology;  Laterality: Left;    Social History   Socioeconomic History  . Marital status: Married    Spouse name: Not on file  . Number of children: Not on file  . Years of education: Not on file  . Highest education level: Not on file  Occupational History  .  Not on file  Tobacco Use  . Smoking status: Never Smoker  . Smokeless tobacco: Never Used  Vaping Use  . Vaping Use: Never used  Substance and Sexual Activity  . Alcohol use: No  . Drug use: No  . Sexual activity: Not on file  Other Topics Concern  . Not on file  Social History Narrative  . Not on file   Social Determinants of Health   Financial Resource  Strain:   . Difficulty of Paying Living Expenses:   Food Insecurity:   . Worried About Charity fundraiser in the Last Year:   . Arboriculturist in the Last Year:   Transportation Needs:   . Film/video editor (Medical):   Marland Kitchen Lack of Transportation (Non-Medical):   Physical Activity:   . Days of Exercise per Week:   . Minutes of Exercise per Session:   Stress:   . Feeling of Stress :   Social Connections:   . Frequency of Communication with Friends and Family:   . Frequency of Social Gatherings with Friends and Family:   . Attends Religious Services:   . Active Member of Clubs or Organizations:   . Attends Archivist Meetings:   Marland Kitchen Marital Status:   Intimate Partner Violence:   . Fear of Current or Ex-Partner:   . Emotionally Abused:   Marland Kitchen Physically Abused:   . Sexually Abused:     Family History  Problem Relation Age of Onset  . Heart disease Mother   . Diabetes Father      Current Outpatient Medications:  .  HYDROcodone-acetaminophen (NORCO/VICODIN) 5-325 MG tablet, Take 1 tablet by mouth 2 (two) times daily as needed for moderate pain. Reported on 06/15/2016 (Patient taking differently: Take 1 tablet by mouth every 6 (six) hours as needed for moderate pain. ), Disp: 30 tablet, Rfl: 0 .  insulin NPH Human (NOVOLIN N) 100 UNIT/ML injection, Inject 25 Units into the skin at bedtime. 25 units every evening, Disp: , Rfl:  .  lisinopril (PRINIVIL,ZESTRIL) 2.5 MG tablet, Take 2.5 mg by mouth at bedtime., Disp: , Rfl:  .  LORazepam (ATIVAN) 1 MG tablet, Take 1 tablet (1 mg total) by mouth daily. (Patient taking differently: Take 1 mg by mouth 2 (two) times daily. ), Disp: 15 tablet, Rfl: 0 .  metFORMIN (GLUCOPHAGE-XR) 500 MG 24 hr tablet, Take 1,000 mg by mouth 2 (two) times daily., Disp: , Rfl:  .  pravastatin (PRAVACHOL) 10 MG tablet, Take 10 mg by mouth at bedtime. , Disp: , Rfl:  .  Semaglutide,0.25 or 0.5MG /DOS, 2 MG/1.5ML SOPN, Inject 0.25 mg into the skin every 7  (seven) days. , Disp: , Rfl:  .  tadalafil (CIALIS) 5 MG tablet, TAKE ONE TABLET BY MOUTH DAILY, Disp: 90 tablet, Rfl: 0 .  terbinafine (LAMISIL) 250 MG tablet, Take 250 mg by mouth at bedtime. (Patient not taking: Reported on 08/05/2020), Disp: , Rfl:   Current Facility-Administered Medications:  .  lidocaine (XYLOCAINE) 2 % jelly 1 application, 1 application, Urethral, Once, Stoioff, Scott C, MD  Physical exam: ECOG 1 Vitals:   08/05/20 1322  BP: 117/74  Pulse: 96  Resp: 16  Temp: 98.5 F (36.9 C)  Weight: 249 lb 4.8 oz (113.1 kg)   Physical Exam Constitutional:      General: He is not in acute distress.    Appearance: Normal appearance. He is obese.  HENT:     Head: Normocephalic and atraumatic.  Eyes:  General: No scleral icterus.    Pupils: Pupils are equal, round, and reactive to light.  Cardiovascular:     Rate and Rhythm: Normal rate and regular rhythm.     Heart sounds: Normal heart sounds.  Pulmonary:     Effort: Pulmonary effort is normal. No respiratory distress.     Breath sounds: Normal breath sounds. No wheezing.  Abdominal:     General: Bowel sounds are normal. There is no distension.     Palpations: Abdomen is soft. There is no mass.     Tenderness: There is no abdominal tenderness.  Musculoskeletal:        General: No deformity. Normal range of motion.     Cervical back: Normal range of motion and neck supple.  Skin:    General: Skin is warm and dry.     Findings: No erythema or rash.  Neurological:     Mental Status: He is alert and oriented to person, place, and time. Mental status is at baseline.     Cranial Nerves: No cranial nerve deficit.     Coordination: Coordination normal.  Psychiatric:        Mood and Affect: Mood normal.      CMP Latest Ref Rng & Units 02/12/2020  Glucose 70 - 99 mg/dL 194(H)  BUN 8 - 23 mg/dL 15  Creatinine 0.61 - 1.24 mg/dL 1.11  Sodium 135 - 145 mmol/L 136  Potassium 3.5 - 5.1 mmol/L 4.2  Chloride 98 - 111  mmol/L 100  CO2 22 - 32 mmol/L 24  Calcium 8.9 - 10.3 mg/dL 9.6  Total Protein 6.5 - 8.1 g/dL 7.5  Total Bilirubin 0.3 - 1.2 mg/dL 0.8  Alkaline Phos 38 - 126 U/L 65  AST 15 - 41 U/L 19  ALT 0 - 44 U/L 22   CBC Latest Ref Rng & Units 08/05/2020  WBC 4.0 - 10.5 K/uL 10.4  Hemoglobin 13.0 - 17.0 g/dL 15.6  Hematocrit 39 - 52 % 46.3  Platelets 150 - 400 K/uL 265   RADIOGRAPHIC STUDIES: I have personally reviewed the radiological images as listed and agreed with the findings in the report. No results found.   Assessment and plan- Patient is a 69 y.o. male with history of secondary polycythemia likely secondary to obstructive sleep apnea   1. Erythrocytosis   2. OSA (obstructive sleep apnea)    #Secondary polycythemia likely due to OSA. Labs reviewed and discussed with patient Hemoglobin appears to be stable.  Further decreased to 15.6, most likely secondary to the optimization of his CPAP machine. Encouraged patient's efforts of using CPAP. Since his hemoglobin has been stable, I will have patient come back in 6 months for another evaluation for the need of phlebotomy. Follow-up lab MD 3 months plus minus phlebotomy.  We spent sufficient time to discuss many aspect of care, questions were answered to patient's satisfaction.     Earlie Server, MD, PhD Hematology Oncology [facility] Pager- [phone] 08/05/2020

## 2020-08-05 NOTE — Progress Notes (Signed)
Patient denies new problems/concerns today.   °

## 2020-09-19 ENCOUNTER — Telehealth: Payer: Self-pay | Admitting: *Deleted

## 2020-09-19 NOTE — Telephone Encounter (Signed)
Please arrange him to do cbc and +/- phlebotomy if HCT >50- parameter has been discussed with him in the past.

## 2020-09-19 NOTE — Telephone Encounter (Signed)
Done... lab/+/- Phlebotomy has been scheduled on 09/29/20 date per pt request.

## 2020-09-19 NOTE — Telephone Encounter (Signed)
Patient last seen in August and set up for 6 month follow up at that time. He went to see his PCP this week and his HCT was 51. He states that he usually gets phlebotomy st 45 and he does not have appointment to see you until January. He is asking if he needs to come in for phlebotomy next week. He said that Wed, Thurs, and Friday are not good for him and only Tuesday if early morning appointment. He also said that his PCP was going to send notes over to you. He is concerned if he does not get phlebotomy done he will end up in hospital. He states he is going to [place] in December and would like this taken care of before then. Please advise

## 2020-09-26 ENCOUNTER — Other Ambulatory Visit: Payer: Self-pay

## 2020-09-26 DIAGNOSIS — D751 Secondary polycythemia: Secondary | ICD-10-CM

## 2020-09-29 ENCOUNTER — Inpatient Hospital Stay: Payer: Medicare Other

## 2020-09-29 ENCOUNTER — Other Ambulatory Visit: Payer: Self-pay

## 2020-09-29 ENCOUNTER — Inpatient Hospital Stay: Payer: Medicare Other | Attending: Oncology

## 2020-09-29 DIAGNOSIS — D751 Secondary polycythemia: Secondary | ICD-10-CM | POA: Insufficient documentation

## 2020-09-29 LAB — CBC WITH DIFFERENTIAL/PLATELET
Abs Immature Granulocytes: 0.04 10*3/uL (ref 0.00–0.07)
Basophils Absolute: 0.1 10*3/uL (ref 0.0–0.1)
Basophils Relative: 1 %
Eosinophils Absolute: 0.3 10*3/uL (ref 0.0–0.5)
Eosinophils Relative: 3 %
HCT: 48.6 % (ref 39.0–52.0)
Hemoglobin: 16.4 g/dL (ref 13.0–17.0)
Immature Granulocytes: 0 %
Lymphocytes Relative: 28 %
Lymphs Abs: 2.6 10*3/uL (ref 0.7–4.0)
MCH: 26.6 pg (ref 26.0–34.0)
MCHC: 33.7 g/dL (ref 30.0–36.0)
MCV: 78.9 fL — ABNORMAL LOW (ref 80.0–100.0)
Monocytes Absolute: 0.9 10*3/uL (ref 0.1–1.0)
Monocytes Relative: 9 %
Neutro Abs: 5.6 10*3/uL (ref 1.7–7.7)
Neutrophils Relative %: 59 %
Platelets: 274 10*3/uL (ref 150–400)
RBC: 6.16 MIL/uL — ABNORMAL HIGH (ref 4.22–5.81)
RDW: 15 % (ref 11.5–15.5)
WBC: 9.5 10*3/uL (ref 4.0–10.5)
nRBC: 0 % (ref 0.0–0.2)

## 2020-10-03 ENCOUNTER — Telehealth: Payer: Self-pay

## 2020-10-03 DIAGNOSIS — D751 Secondary polycythemia: Secondary | ICD-10-CM

## 2020-10-03 NOTE — Telephone Encounter (Signed)
Patient notified.  He is traveling to [place] for 5 days in December and is concerned about having issues while out of state.  He would like to have his labs checked again before traveling.

## 2020-10-03 NOTE — Telephone Encounter (Signed)
-----   Message from Earlie Server, MD sent at 10/02/2020  9:53 PM EDT ----- Let him now that his Hct is 48.6 and is within target level, and he does not need phlebotomy. Keep current follow up plan.

## 2020-10-03 NOTE — Telephone Encounter (Signed)
[place] lab encounter to check cbc

## 2020-10-03 NOTE — Telephone Encounter (Signed)
Please schedule a lab appt on 11/24/20 and inform patient of appt detail.

## 2020-10-03 NOTE — Addendum Note (Signed)
Addended by: Vanice Sarah on: 10/03/2020 03:28 PM   Modules accepted: Orders

## 2020-10-06 ENCOUNTER — Ambulatory Visit: Payer: Medicare HMO | Admitting: Urology

## 2020-10-08 ENCOUNTER — Ambulatory Visit: Payer: Self-pay | Admitting: Urology

## 2020-10-17 ENCOUNTER — Other Ambulatory Visit: Payer: Self-pay | Admitting: Urology

## 2020-10-17 DIAGNOSIS — N401 Enlarged prostate with lower urinary tract symptoms: Secondary | ICD-10-CM

## 2020-11-24 ENCOUNTER — Inpatient Hospital Stay: Payer: Medicare Other | Attending: Oncology

## 2020-11-24 ENCOUNTER — Other Ambulatory Visit: Payer: Self-pay

## 2020-11-24 DIAGNOSIS — D751 Secondary polycythemia: Secondary | ICD-10-CM | POA: Insufficient documentation

## 2020-11-24 LAB — CBC WITH DIFFERENTIAL/PLATELET
Abs Immature Granulocytes: 0.02 10*3/uL (ref 0.00–0.07)
Basophils Absolute: 0.1 10*3/uL (ref 0.0–0.1)
Basophils Relative: 1 %
Eosinophils Absolute: 0.4 10*3/uL (ref 0.0–0.5)
Eosinophils Relative: 4 %
HCT: 51.8 % (ref 39.0–52.0)
Hemoglobin: 17.7 g/dL — ABNORMAL HIGH (ref 13.0–17.0)
Immature Granulocytes: 0 %
Lymphocytes Relative: 28 %
Lymphs Abs: 3 10*3/uL (ref 0.7–4.0)
MCH: 27.6 pg (ref 26.0–34.0)
MCHC: 34.2 g/dL (ref 30.0–36.0)
MCV: 80.8 fL (ref 80.0–100.0)
Monocytes Absolute: 0.9 10*3/uL (ref 0.1–1.0)
Monocytes Relative: 8 %
Neutro Abs: 6.3 10*3/uL (ref 1.7–7.7)
Neutrophils Relative %: 59 %
Platelets: 272 10*3/uL (ref 150–400)
RBC: 6.41 MIL/uL — ABNORMAL HIGH (ref 4.22–5.81)
RDW: 15.9 % — ABNORMAL HIGH (ref 11.5–15.5)
WBC: 10.6 10*3/uL — ABNORMAL HIGH (ref 4.0–10.5)
nRBC: 0 % (ref 0.0–0.2)

## 2020-11-25 ENCOUNTER — Inpatient Hospital Stay: Payer: Medicare Other

## 2020-11-25 VITALS — BP 124/81 | HR 103 | Temp 98.1°F | Resp 20

## 2020-11-25 DIAGNOSIS — D751 Secondary polycythemia: Secondary | ICD-10-CM

## 2020-11-25 NOTE — Progress Notes (Signed)
Phlebotomy performed per MD order. Pt tolerated procedure well. Pt and VS stable at discharge.

## 2021-02-04 ENCOUNTER — Encounter: Payer: Self-pay | Admitting: Oncology

## 2021-02-04 ENCOUNTER — Other Ambulatory Visit: Payer: Self-pay

## 2021-02-04 ENCOUNTER — Inpatient Hospital Stay: Payer: Medicare Other

## 2021-02-04 ENCOUNTER — Inpatient Hospital Stay: Payer: Medicare Other | Attending: Oncology

## 2021-02-04 ENCOUNTER — Inpatient Hospital Stay (HOSPITAL_BASED_OUTPATIENT_CLINIC_OR_DEPARTMENT_OTHER): Payer: Medicare Other | Admitting: Oncology

## 2021-02-04 VITALS — BP 127/89 | HR 103 | Temp 98.2°F | Resp 18 | Wt 246.8 lb

## 2021-02-04 VITALS — BP 114/65 | HR 100 | Temp 97.3°F

## 2021-02-04 DIAGNOSIS — F419 Anxiety disorder, unspecified: Secondary | ICD-10-CM | POA: Diagnosis not present

## 2021-02-04 DIAGNOSIS — Z7984 Long term (current) use of oral hypoglycemic drugs: Secondary | ICD-10-CM | POA: Diagnosis not present

## 2021-02-04 DIAGNOSIS — G4700 Insomnia, unspecified: Secondary | ICD-10-CM | POA: Diagnosis not present

## 2021-02-04 DIAGNOSIS — E119 Type 2 diabetes mellitus without complications: Secondary | ICD-10-CM | POA: Diagnosis not present

## 2021-02-04 DIAGNOSIS — G4730 Sleep apnea, unspecified: Secondary | ICD-10-CM | POA: Insufficient documentation

## 2021-02-04 DIAGNOSIS — D751 Secondary polycythemia: Secondary | ICD-10-CM

## 2021-02-04 DIAGNOSIS — M129 Arthropathy, unspecified: Secondary | ICD-10-CM | POA: Insufficient documentation

## 2021-02-04 DIAGNOSIS — Z87442 Personal history of urinary calculi: Secondary | ICD-10-CM | POA: Insufficient documentation

## 2021-02-04 DIAGNOSIS — Z79899 Other long term (current) drug therapy: Secondary | ICD-10-CM | POA: Diagnosis not present

## 2021-02-04 LAB — CBC WITH DIFFERENTIAL/PLATELET
Abs Immature Granulocytes: 0.03 10*3/uL (ref 0.00–0.07)
Basophils Absolute: 0.1 10*3/uL (ref 0.0–0.1)
Basophils Relative: 1 %
Eosinophils Absolute: 0.4 10*3/uL (ref 0.0–0.5)
Eosinophils Relative: 4 %
HCT: 52.1 % — ABNORMAL HIGH (ref 39.0–52.0)
Hemoglobin: 18 g/dL — ABNORMAL HIGH (ref 13.0–17.0)
Immature Granulocytes: 0 %
Lymphocytes Relative: 31 %
Lymphs Abs: 3.6 10*3/uL (ref 0.7–4.0)
MCH: 28.3 pg (ref 26.0–34.0)
MCHC: 34.5 g/dL (ref 30.0–36.0)
MCV: 81.9 fL (ref 80.0–100.0)
Monocytes Absolute: 1 10*3/uL (ref 0.1–1.0)
Monocytes Relative: 9 %
Neutro Abs: 6.4 10*3/uL (ref 1.7–7.7)
Neutrophils Relative %: 55 %
Platelets: 256 10*3/uL (ref 150–400)
RBC: 6.36 MIL/uL — ABNORMAL HIGH (ref 4.22–5.81)
RDW: 14.3 % (ref 11.5–15.5)
WBC: 11.5 10*3/uL — ABNORMAL HIGH (ref 4.0–10.5)
nRBC: 0 % (ref 0.0–0.2)

## 2021-02-04 NOTE — Progress Notes (Signed)
Therapeutic phlebotomy with 555ml performed per Dr Tasia Catchings orders. Pt tolerated procedure well. VSS. No complaints at time of discharge.

## 2021-02-04 NOTE — Progress Notes (Signed)
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  BCR able and Jak 2 mutation testing was negative in 2011.  He does have a history of obstructive sleep apnea and uses CPAP.  He is [name] patient.  He was initially getting phlebotomy every 2 weeks but now gets it every 2 months if need be.  His threshold for phlebotomy is a hematocrit of greater than 47.  Patient does report improvement in his fatigue for a few days after he gets a phlebotomy   patient previously follows up with [name]. He switches care to me on 01/15/2019.  06/15/2016 JAK2 V617F negative, Exons 12-15 mutation negative.  06/15/2017 Erythropoietin 12.8.  05/15/2019 further testing including MPL, carl mutation which both are negative  Interval history-  70 y.o. male with a history of erythrocytosis present to follow-up.  Patient reports no new complaints. He reports being compliant with CPAP machine. Denies any shortness of breath or chest pain  Review of systems- Review of Systems  Constitutional: Negative for chills, fever, malaise/fatigue and weight loss.  HENT: Negative for sore throat.   Eyes: Negative for redness.  Respiratory: Negative for cough, shortness of breath and wheezing.   Cardiovascular: Negative for chest pain, palpitations and leg swelling.  Gastrointestinal: Negative for abdominal pain, blood in stool, nausea and vomiting.  Genitourinary: Negative for dysuria.  Musculoskeletal: Negative for myalgias.  Skin: Negative for rash.   Neurological: Negative for dizziness, tingling and tremors.  Endo/Heme/Allergies: Does not bruise/bleed easily.  Psychiatric/Behavioral: Negative for hallucinations.       Allergies  Allergen Reactions  . Sertraline Hcl Other (See Comments)    Vivid dreams     Past Medical History:  Diagnosis Date  . Anxiety   . Arthritis   . Diabetes mellitus without complication ([place])   . Erythrocytosis 05/18/2015  . Erythrocytosis   . History of kidney stones   . 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 Polycythemia   . Sleep apnea    USES CPAP     Past Surgical History:  Procedure Laterality Date  . BLADDER STONE REMOVAL    . cataracts Bilateral   . COLONOSCOPY N/A 06/02/2015   Procedure: COLONOSCOPY;  Surgeon: [name], MD;  Location: [facility] ENDOSCOPY;  Service: Endoscopy;  Laterality: N/A;  . CYSTOSCOPY W/ RETROGRADES Left 11/01/2017   Procedure: CYSTOSCOPY WITH RETROGRADE PYELOGRAM;  Surgeon: [name], MD;  Location: [facility] ORS;  Service: Urology;  Laterality: Left;  . CYSTOSCOPY WITH STENT PLACEMENT Left 11/01/2017   Procedure: CYSTOSCOPY WITH STENT PLACEMENT;  Surgeon: [name], MD;  Location: [facility] ORS;  Service: Urology;  Laterality: Left;    Social History   Socioeconomic History  . Marital status: Married    Spouse name: Not on file  . Number of children: Not on file  . Years of education: Not on file  . Highest education level: Not on file  Occupational History  . Not on file  Tobacco Use  . Smoking status:  Never Smoker  . Smokeless tobacco: Never Used  Vaping Use  . Vaping Use: Never used  Substance and Sexual Activity  . Alcohol use: No  . Drug use: No  . Sexual activity: Not on file  Other Topics Concern  . Not on file  Social History Narrative  . Not on file   Social Determinants of Health   Financial Resource Strain: Not on file  Food Insecurity: Not on  file  Transportation Needs: Not on file  Physical Activity: Not on file  Stress: Not on file  Social Connections: Not on file  Intimate Partner Violence: Not on file    Family History  Problem Relation Age of Onset  . Heart disease Mother   . Diabetes Father      Current Outpatient Medications:  .  glipiZIDE (GLUCOTROL XL) 10 MG 24 hr tablet, Take 1 tablet by mouth 2 (two) times daily., Disp: , Rfl:  .  HYDROcodone-acetaminophen (NORCO/VICODIN) 5-325 MG tablet, Take 1 tablet by mouth 2 (two) times daily as needed for moderate pain. Reported on 06/15/2016 (Patient taking differently: Take 1 tablet by mouth every 6 (six) hours as needed for moderate pain.), Disp: 30 tablet, Rfl: 0 .  insulin NPH Human (NOVOLIN N) 100 UNIT/ML injection, Inject 25 Units into the skin at bedtime. 25 units every evening, Disp: , Rfl:  .  lisinopril (PRINIVIL,ZESTRIL) 2.5 MG tablet, Take 2.5 mg by mouth at bedtime., Disp: , Rfl:  .  LORazepam (ATIVAN) 1 MG tablet, Take 1 tablet (1 mg total) by mouth daily. (Patient taking differently: Take 1 mg by mouth 2 (two) times daily.), Disp: 15 tablet, Rfl: 0 .  metFORMIN (GLUCOPHAGE-XR) 500 MG 24 hr tablet, Take 1,000 mg by mouth 2 (two) times daily., Disp: , Rfl:  .  pioglitazone (ACTOS) 15 MG tablet, Take 15 mg by mouth daily., Disp: , Rfl:  .  pravastatin (PRAVACHOL) 10 MG tablet, Take 10 mg by mouth at bedtime. , Disp: , Rfl:  .  Semaglutide,0.25 or 0.5MG /DOS, 2 MG/1.5ML SOPN, Inject 0.25 mg into the skin every 7 (seven) days. , Disp: , Rfl:  .  tadalafil (CIALIS) 5 MG tablet, TAKE ONE TABLET BY MOUTH DAILY, Disp: 90 tablet, Rfl: 0 .  terbinafine (LAMISIL) 250 MG tablet, Take 250 mg by mouth at bedtime., Disp: , Rfl:   Current Facility-Administered Medications:  .  lidocaine (XYLOCAINE) 2 % jelly 1 application, 1 application, Urethral, Once, Stoioff, Scott C, MD  Physical exam: ECOG 1 Vitals:   02/04/21 1313  BP: 127/89  Pulse: (!) 103  Resp: 18  Temp: 98.2  F (36.8 C)  Weight: 246 lb 12.8 oz (111.9 kg)   Physical Exam Constitutional:      General: He is not in acute distress.    Appearance: Normal appearance. He is obese.  HENT:     Head: Normocephalic and atraumatic.  Eyes:     General: No scleral icterus.    Pupils: Pupils are equal, round, and reactive to light.  Cardiovascular:     Rate and Rhythm: Normal rate and regular rhythm.     Heart sounds: Normal heart sounds.  Pulmonary:     Effort: Pulmonary effort is normal. No respiratory distress.     Breath sounds: Normal breath sounds. No wheezing.  Abdominal:     General: Bowel sounds are normal. There is no distension.     Palpations: Abdomen is soft. There is no mass.     Tenderness: There is no abdominal  tenderness.  Musculoskeletal:        General: No deformity. Normal range of motion.     Cervical back: Normal range of motion and neck supple.  Skin:    General: Skin is warm and dry.     Findings: No erythema or rash.  Neurological:     Mental Status: He is alert and oriented to person, place, and time. Mental status is at baseline.     Cranial Nerves: No cranial nerve deficit.     Coordination: Coordination normal.  Psychiatric:        Mood and Affect: Mood normal.      CMP Latest Ref Rng & Units 02/12/2020  Glucose 70 - 99 mg/dL 194(H)  BUN 8 - 23 mg/dL 15  Creatinine 0.61 - 1.24 mg/dL 1.11  Sodium 135 - 145 mmol/L 136  Potassium 3.5 - 5.1 mmol/L 4.2  Chloride 98 - 111 mmol/L 100  CO2 22 - 32 mmol/L 24  Calcium 8.9 - 10.3 mg/dL 9.6  Total Protein 6.5 - 8.1 g/dL 7.5  Total Bilirubin 0.3 - 1.2 mg/dL 0.8  Alkaline Phos 38 - 126 U/L 65  AST 15 - 41 U/L 19  ALT 0 - 44 U/L 22   CBC Latest Ref Rng & Units 02/04/2021  WBC 4.0 - 10.5 K/uL 11.5(H)  Hemoglobin 13.0 - 17.0 g/dL 18.0(H)  Hematocrit 39.0 - 52.0 % 52.1(H)  Platelets 150 - 400 K/uL 256   RADIOGRAPHIC STUDIES: I have personally reviewed the radiological images as listed and agreed with the findings in  the report. No results found.   Assessment and plan- Patient is a 70 y.o. male with history of secondary polycythemia likely secondary to obstructive sleep apnea   1. Erythrocytosis    #Secondary polycythemia likely due to OSA. Labs reviewed and discussed with patient. Hemoglobin 18, hematocrit 52.  I recommend patient to proceed with phlebotomy 500 cc x 1 today. Continue use of CPAP machine.   Follow-up lab MD 3 months plus minus phlebotomy.  We spent sufficient time to discuss many aspect of care, questions were answered to patient's satisfaction.     Earlie Server, MD, PhD Hematology Oncology [facility] Pager- [phone] 02/04/2021

## 2021-02-04 NOTE — Progress Notes (Signed)
Pt here for follow up. No new concerns voiced.   

## 2021-02-04 NOTE — Patient Instructions (Signed)

## 2021-02-16 ENCOUNTER — Other Ambulatory Visit: Payer: Self-pay | Admitting: Urology

## 2021-02-16 DIAGNOSIS — N401 Enlarged prostate with lower urinary tract symptoms: Secondary | ICD-10-CM

## 2021-05-05 ENCOUNTER — Inpatient Hospital Stay (HOSPITAL_BASED_OUTPATIENT_CLINIC_OR_DEPARTMENT_OTHER): Payer: Medicare Other | Admitting: Oncology

## 2021-05-05 ENCOUNTER — Encounter: Payer: Self-pay | Admitting: Oncology

## 2021-05-05 ENCOUNTER — Inpatient Hospital Stay: Payer: Medicare Other

## 2021-05-05 ENCOUNTER — Other Ambulatory Visit: Payer: Self-pay

## 2021-05-05 ENCOUNTER — Inpatient Hospital Stay: Payer: Medicare Other | Attending: Oncology

## 2021-05-05 VITALS — BP 119/78 | Temp 108.0°F | Resp 16

## 2021-05-05 VITALS — BP 129/77 | HR 102 | Temp 98.4°F | Resp 18 | Wt 249.0 lb

## 2021-05-05 DIAGNOSIS — E119 Type 2 diabetes mellitus without complications: Secondary | ICD-10-CM | POA: Diagnosis not present

## 2021-05-05 DIAGNOSIS — Z79899 Other long term (current) drug therapy: Secondary | ICD-10-CM | POA: Insufficient documentation

## 2021-05-05 DIAGNOSIS — G4730 Sleep apnea, unspecified: Secondary | ICD-10-CM | POA: Insufficient documentation

## 2021-05-05 DIAGNOSIS — D72829 Elevated white blood cell count, unspecified: Secondary | ICD-10-CM | POA: Insufficient documentation

## 2021-05-05 DIAGNOSIS — Z7984 Long term (current) use of oral hypoglycemic drugs: Secondary | ICD-10-CM | POA: Insufficient documentation

## 2021-05-05 DIAGNOSIS — Z87442 Personal history of urinary calculi: Secondary | ICD-10-CM | POA: Diagnosis not present

## 2021-05-05 DIAGNOSIS — D751 Secondary polycythemia: Secondary | ICD-10-CM

## 2021-05-05 DIAGNOSIS — G4733 Obstructive sleep apnea (adult) (pediatric): Secondary | ICD-10-CM

## 2021-05-05 LAB — CBC WITH DIFFERENTIAL/PLATELET
Abs Immature Granulocytes: 0.04 10*3/uL (ref 0.00–0.07)
Basophils Absolute: 0.1 10*3/uL (ref 0.0–0.1)
Basophils Relative: 1 %
Eosinophils Absolute: 0.3 10*3/uL (ref 0.0–0.5)
Eosinophils Relative: 3 %
HCT: 52.1 % — ABNORMAL HIGH (ref 39.0–52.0)
Hemoglobin: 17.5 g/dL — ABNORMAL HIGH (ref 13.0–17.0)
Immature Granulocytes: 0 %
Lymphocytes Relative: 21 %
Lymphs Abs: 2.5 10*3/uL (ref 0.7–4.0)
MCH: 27.5 pg (ref 26.0–34.0)
MCHC: 33.6 g/dL (ref 30.0–36.0)
MCV: 81.8 fL (ref 80.0–100.0)
Monocytes Absolute: 0.9 10*3/uL (ref 0.1–1.0)
Monocytes Relative: 8 %
Neutro Abs: 8 10*3/uL — ABNORMAL HIGH (ref 1.7–7.7)
Neutrophils Relative %: 67 %
Platelets: 264 10*3/uL (ref 150–400)
RBC: 6.37 MIL/uL — ABNORMAL HIGH (ref 4.22–5.81)
RDW: 14.5 % (ref 11.5–15.5)
WBC: 11.8 10*3/uL — ABNORMAL HIGH (ref 4.0–10.5)
nRBC: 0 % (ref 0.0–0.2)

## 2021-05-05 NOTE — Progress Notes (Signed)
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  BCR able and Jak 2 mutation testing was negative in 2011.  He does have a history of obstructive sleep apnea and uses CPAP.  He used to follow up with [name].  He was initially getting phlebotomy every 2 weeks but now gets it every 2 months if need be.  His threshold for phlebotomy was  a hematocrit of greater than 47.  patient previously follows up with [name]. He switches care to me on 01/15/2019.  06/15/2016 JAK2 V617F negative, Exons 12-15 mutation negative.  06/15/2017 Erythropoietin 12.8.  05/15/2019 further testing including MPL, carl mutation which both are negative  On Phlebotomy PRN if Hct >50 now.   Interval history-  70 y.o. male with a history of erythrocytosis present to follow-up.  Patient has no new complaints today. He reports being compliant with CPAP machine. Denies any shortness of breath or chest pain  Review of systems- Review of Systems  Constitutional: Negative for chills, fever, malaise/fatigue and weight loss.  HENT: Negative for sore throat.   Eyes: Negative for redness.  Respiratory: Negative for cough, shortness of breath and wheezing.   Cardiovascular: Negative for chest pain, palpitations and leg swelling.  Gastrointestinal: Negative for abdominal pain, blood in stool, nausea and vomiting.  Genitourinary: Negative for dysuria.  Musculoskeletal: Negative for myalgias.  Skin: Negative for rash.  Neurological: Negative for dizziness, tingling and  tremors.  Endo/Heme/Allergies: Does not bruise/bleed easily.  Psychiatric/Behavioral: Negative for hallucinations.       Allergies  Allergen Reactions  . Sertraline Hcl Other (See Comments)    Vivid dreams     Past Medical History:  Diagnosis Date  . Anxiety   . Arthritis   . Diabetes mellitus without complication ([place])   . Erythrocytosis 05/18/2015  . Erythrocytosis   . History of kidney stones   . 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 Polycythemia   . Sleep apnea    USES CPAP     Past Surgical History:  Procedure Laterality Date  . BLADDER STONE REMOVAL    . cataracts Bilateral   . COLONOSCOPY N/A 06/02/2015   Procedure: COLONOSCOPY;  Surgeon: [name], MD;  Location: [facility] ENDOSCOPY;  Service: Endoscopy;  Laterality: N/A;  . CYSTOSCOPY W/ RETROGRADES Left 11/01/2017   Procedure: CYSTOSCOPY WITH RETROGRADE PYELOGRAM;  Surgeon: [name], MD;  Location: [facility] ORS;  Service: Urology;  Laterality: Left;  . CYSTOSCOPY WITH STENT PLACEMENT Left 11/01/2017   Procedure: CYSTOSCOPY WITH STENT PLACEMENT;  Surgeon: [name], MD;  Location: [facility] ORS;  Service: Urology;  Laterality: Left;    Social History   Socioeconomic History  . Marital status: Married    Spouse name: Not on file  . Number of children: Not on file  . Years of education: Not on file  . Highest education level: Not on file  Occupational History  . Not on file  Tobacco Use  . Smoking status: Never Smoker  .  Smokeless tobacco: Never Used  Vaping Use  . Vaping Use: Never used  Substance and Sexual Activity  . Alcohol use: No  . Drug use: No  . Sexual activity: Not on file  Other Topics Concern  . Not on file  Social History Narrative  . Not on file   Social Determinants of Health   Financial Resource Strain: Not on file  Food Insecurity: Not on file  Transportation Needs: Not on file  Physical  Activity: Not on file  Stress: Not on file  Social Connections: Not on file  Intimate Partner Violence: Not on file    Family History  Problem Relation Age of Onset  . Heart disease Mother   . Diabetes Father      Current Outpatient Medications:  .  glipiZIDE (GLUCOTROL XL) 10 MG 24 hr tablet, Take 1 tablet by mouth 2 (two) times daily., Disp: , Rfl:  .  HYDROcodone-acetaminophen (NORCO/VICODIN) 5-325 MG tablet, Take 1 tablet by mouth 2 (two) times daily as needed for moderate pain. Reported on 06/15/2016 (Patient taking differently: Take 1 tablet by mouth every 6 (six) hours as needed for moderate pain.), Disp: 30 tablet, Rfl: 0 .  insulin NPH Human (NOVOLIN N) 100 UNIT/ML injection, Inject 25 Units into the skin at bedtime. 25 units every evening, Disp: , Rfl:  .  lisinopril (PRINIVIL,ZESTRIL) 2.5 MG tablet, Take 2.5 mg by mouth at bedtime., Disp: , Rfl:  .  LORazepam (ATIVAN) 1 MG tablet, Take 1 tablet (1 mg total) by mouth daily. (Patient taking differently: Take 1 mg by mouth 2 (two) times daily.), Disp: 15 tablet, Rfl: 0 .  metFORMIN (GLUCOPHAGE-XR) 500 MG 24 hr tablet, Take 1,000 mg by mouth 2 (two) times daily., Disp: , Rfl:  .  pioglitazone (ACTOS) 15 MG tablet, Take 15 mg by mouth daily., Disp: , Rfl:  .  pravastatin (PRAVACHOL) 10 MG tablet, Take 10 mg by mouth at bedtime. , Disp: , Rfl:  .  Semaglutide,0.25 or 0.5MG /DOS, 2 MG/1.5ML SOPN, Inject 0.25 mg into the skin every 7 (seven) days. , Disp: , Rfl:  .  tadalafil (CIALIS) 5 MG tablet, TAKE ONE TABLET BY MOUTH DAILY, Disp: 90 tablet, Rfl: 0 .  terbinafine (LAMISIL) 250 MG tablet, Take 250 mg by mouth at bedtime., Disp: , Rfl:   Current Facility-Administered Medications:  .  lidocaine (XYLOCAINE) 2 % jelly 1 application, 1 application, Urethral, Once, Stoioff, Scott C, MD  Physical exam: ECOG 1 Vitals:   05/05/21 1317  BP: 129/77  Pulse: (!) 102  Resp: 18  Temp: 98.4 F (36.9 C)  Weight: 249 lb (112.9 kg)   Physical  Exam Constitutional:      General: He is not in acute distress.    Appearance: Normal appearance. He is obese.  HENT:     Head: Normocephalic and atraumatic.  Eyes:     General: No scleral icterus.    Pupils: Pupils are equal, round, and reactive to light.  Cardiovascular:     Rate and Rhythm: Normal rate and regular rhythm.     Heart sounds: Normal heart sounds.  Pulmonary:     Effort: Pulmonary effort is normal. No respiratory distress.     Breath sounds: Normal breath sounds. No wheezing.  Abdominal:     General: Bowel sounds are normal. There is no distension.     Palpations: Abdomen is soft. There is no mass.     Tenderness: There is no abdominal tenderness.  Musculoskeletal:  General: No deformity. Normal range of motion.     Cervical back: Normal range of motion and neck supple.  Skin:    General: Skin is warm and dry.     Findings: No erythema or rash.  Neurological:     Mental Status: He is alert and oriented to person, place, and time. Mental status is at baseline.     Cranial Nerves: No cranial nerve deficit.     Coordination: Coordination normal.  Psychiatric:        Mood and Affect: Mood normal.      CMP Latest Ref Rng & Units 02/12/2020  Glucose 70 - 99 mg/dL 194(H)  BUN 8 - 23 mg/dL 15  Creatinine 0.61 - 1.24 mg/dL 1.11  Sodium 135 - 145 mmol/L 136  Potassium 3.5 - 5.1 mmol/L 4.2  Chloride 98 - 111 mmol/L 100  CO2 22 - 32 mmol/L 24  Calcium 8.9 - 10.3 mg/dL 9.6  Total Protein 6.5 - 8.1 g/dL 7.5  Total Bilirubin 0.3 - 1.2 mg/dL 0.8  Alkaline Phos 38 - 126 U/L 65  AST 15 - 41 U/L 19  ALT 0 - 44 U/L 22   CBC Latest Ref Rng & Units 05/05/2021  WBC 4.0 - 10.5 K/uL 11.8(H)  Hemoglobin 13.0 - 17.0 g/dL 17.5(H)  Hematocrit 39.0 - 52.0 % 52.1(H)  Platelets 150 - 400 K/uL 264   RADIOGRAPHIC STUDIES: I have personally reviewed the radiological images as listed and agreed with the findings in the report. No results found.   Assessment and plan-  Patient is a 70 y.o. male with history of secondary polycythemia likely secondary to obstructive sleep apnea   1. Erythrocytosis   2. OSA (obstructive sleep apnea)   3. Leukocytosis, unspecified type    #Secondary polycythemia likely due to OSA. Labs reviewed and discussed with patient Hematocrit is 52.1, recommend to proceed with phlebotomy 500 cc x 1 today Repeat H&H in 2 weeks +/- phlebotomy if hematocrit above 50. Continue use of CPAP machine.    # Chronic intermittent leukocytosis.  Predominantly neutrophilia.  Likely reactive.  I will check peripheral blood flow cytometry for the next visit.  Follow-up lab MD 3 months plus minus phlebotomy.  We spent sufficient time to discuss many aspect of care, questions were answered to patient's satisfaction.     Earlie Server, MD, PhD Hematology Oncology [facility] at [facility] Pager- [phone] 05/05/2021

## 2021-05-05 NOTE — Progress Notes (Signed)
Pt here for follow up. No new concerns voiced.   

## 2021-05-19 ENCOUNTER — Inpatient Hospital Stay: Payer: Medicare Other

## 2021-06-02 ENCOUNTER — Inpatient Hospital Stay: Payer: Medicare Other

## 2021-06-02 ENCOUNTER — Inpatient Hospital Stay: Payer: Medicare Other | Attending: Oncology

## 2021-06-02 DIAGNOSIS — D751 Secondary polycythemia: Secondary | ICD-10-CM | POA: Insufficient documentation

## 2021-06-02 LAB — HEMOGLOBIN AND HEMATOCRIT, BLOOD
HCT: 47.9 % (ref 39.0–52.0)
Hemoglobin: 16.4 g/dL (ref 13.0–17.0)

## 2021-06-02 NOTE — Progress Notes (Unsigned)
HCT 47.9  MD parameters state to perform phlebotomy if HCT > 50 therefor no phlebotomy required today. Pt informed of result.

## 2021-08-04 ENCOUNTER — Inpatient Hospital Stay: Payer: Medicare Other | Attending: Oncology

## 2021-08-04 ENCOUNTER — Inpatient Hospital Stay: Payer: Medicare Other | Admitting: Oncology

## 2021-08-04 ENCOUNTER — Encounter: Payer: Self-pay | Admitting: Oncology

## 2021-08-04 ENCOUNTER — Inpatient Hospital Stay: Payer: Medicare Other

## 2021-08-04 VITALS — BP 131/79 | HR 88 | Temp 97.6°F | Resp 18

## 2021-08-04 VITALS — BP 131/78 | HR 94 | Temp 98.3°F | Resp 18 | Wt 245.4 lb

## 2021-08-04 DIAGNOSIS — G4733 Obstructive sleep apnea (adult) (pediatric): Secondary | ICD-10-CM

## 2021-08-04 DIAGNOSIS — Z7984 Long term (current) use of oral hypoglycemic drugs: Secondary | ICD-10-CM | POA: Diagnosis not present

## 2021-08-04 DIAGNOSIS — Z79899 Other long term (current) drug therapy: Secondary | ICD-10-CM | POA: Diagnosis not present

## 2021-08-04 DIAGNOSIS — E119 Type 2 diabetes mellitus without complications: Secondary | ICD-10-CM | POA: Diagnosis not present

## 2021-08-04 DIAGNOSIS — Z87442 Personal history of urinary calculi: Secondary | ICD-10-CM | POA: Diagnosis not present

## 2021-08-04 DIAGNOSIS — D751 Secondary polycythemia: Secondary | ICD-10-CM

## 2021-08-04 DIAGNOSIS — D72829 Elevated white blood cell count, unspecified: Secondary | ICD-10-CM | POA: Insufficient documentation

## 2021-08-04 DIAGNOSIS — G4730 Sleep apnea, unspecified: Secondary | ICD-10-CM | POA: Insufficient documentation

## 2021-08-04 DIAGNOSIS — M129 Arthropathy, unspecified: Secondary | ICD-10-CM | POA: Diagnosis not present

## 2021-08-04 LAB — CBC WITH DIFFERENTIAL/PLATELET
Abs Immature Granulocytes: 0.03 10*3/uL (ref 0.00–0.07)
Basophils Absolute: 0 10*3/uL (ref 0.0–0.1)
Basophils Relative: 0 %
Eosinophils Absolute: 0.6 10*3/uL — ABNORMAL HIGH (ref 0.0–0.5)
Eosinophils Relative: 6 %
HCT: 51.7 % (ref 39.0–52.0)
Hemoglobin: 17.5 g/dL — ABNORMAL HIGH (ref 13.0–17.0)
Immature Granulocytes: 0 %
Lymphocytes Relative: 31 %
Lymphs Abs: 3.1 10*3/uL (ref 0.7–4.0)
MCH: 28.1 pg (ref 26.0–34.0)
MCHC: 33.8 g/dL (ref 30.0–36.0)
MCV: 83 fL (ref 80.0–100.0)
Monocytes Absolute: 0.8 10*3/uL (ref 0.1–1.0)
Monocytes Relative: 8 %
Neutro Abs: 5.7 10*3/uL (ref 1.7–7.7)
Neutrophils Relative %: 55 %
Platelets: 253 10*3/uL (ref 150–400)
RBC: 6.23 MIL/uL — ABNORMAL HIGH (ref 4.22–5.81)
RDW: 15 % (ref 11.5–15.5)
WBC: 10.2 10*3/uL (ref 4.0–10.5)
nRBC: 0 % (ref 0.0–0.2)

## 2021-08-04 NOTE — Progress Notes (Signed)
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  BCR able and Jak 2 mutation testing was negative in 2011.  He does have a history of obstructive sleep apnea and uses CPAP.  He used to follow up with [name].  He was initially getting phlebotomy every 2 weeks but now gets it every 2 months if need be.  His threshold for phlebotomy was  a hematocrit of greater than 47.  patient previously follows up with [name]. He switches care to me on 01/15/2019.  06/15/2016 JAK2 V617F negative, Exons 12-15 mutation negative.  06/15/2017 Erythropoietin 12.8.  05/15/2019 further testing including MPL, carl mutation which both are negative  On Phlebotomy PRN if Hct >50 now.   Interval history-  70 y.o. male with a history of erythrocytosis present to follow-up.  Patient has no new complaints today. Patient uses his CPAP machine every day.  No new symptoms.  He usually feels better after each phlebotomy session. Denies any shortness of breath or chest pain  Review of systems- Review of Systems  Constitutional:  Negative for chills, fever, malaise/fatigue and weight loss.  HENT:  Negative for sore throat.   Eyes:  Negative for redness.  Respiratory:  Negative for cough, shortness of breath and wheezing.   Cardiovascular:  Negative for chest pain, palpitations and leg swelling.  Gastrointestinal:  Negative for abdominal pain, blood in stool, nausea and vomiting.  Genitourinary:  Negative for dysuria.  Musculoskeletal:  Negative for myalgias.   Skin:  Negative for rash.  Neurological:  Negative for dizziness, tingling and tremors.  Endo/Heme/Allergies:  Does not bruise/bleed easily.  Psychiatric/Behavioral:  Negative for hallucinations.       Allergies  Allergen Reactions   Sertraline Hcl Other (See Comments)    Vivid dreams     Past Medical History:  Diagnosis Date   Anxiety    Arthritis    Diabetes mellitus without complication ([place])    Erythrocytosis 05/18/2015   Erythrocytosis    History of kidney stones    Hydronephrosis with ureteropelvic junction (UPJ) obstruction 12/06/2017   Per [facility] Flow w Pharm order   Polycythemia    Sleep apnea    USES CPAP     Past Surgical History:  Procedure Laterality Date   BLADDER STONE REMOVAL     cataracts Bilateral    COLONOSCOPY N/A 06/02/2015   Procedure: COLONOSCOPY;  Surgeon: [name], MD;  Location: [facility] ENDOSCOPY;  Service: Endoscopy;  Laterality: N/A;   CYSTOSCOPY W/ RETROGRADES Left 11/01/2017   Procedure: CYSTOSCOPY WITH RETROGRADE PYELOGRAM;  Surgeon: [name], MD;  Location: [facility] ORS;  Service: Urology;  Laterality: Left;   CYSTOSCOPY WITH STENT PLACEMENT Left 11/01/2017   Procedure: CYSTOSCOPY WITH STENT PLACEMENT;  Surgeon: [name], MD;  Location: [facility] ORS;  Service: Urology;  Laterality: Left;    Social History   Socioeconomic History   Marital status: Married    Spouse name: Not on file   Number of children: Not on file   Years of education: Not on file  Highest education level: Not on file  Occupational History   Not on file  Tobacco Use   Smoking status: Never   Smokeless tobacco: Never  Vaping Use   Vaping Use: Never used  Substance and Sexual Activity   Alcohol use: No   Drug use: No   Sexual activity: Not on file  Other Topics Concern   Not on file  Social History Narrative   Not on file   Social Determinants of Health   Financial Resource Strain: Not on file  Food Insecurity: Not on file   Transportation Needs: Not on file  Physical Activity: Not on file  Stress: Not on file  Social Connections: Not on file  Intimate Partner Violence: Not on file    Family History  Problem Relation Age of Onset   Heart disease Mother    Diabetes Father      Current Outpatient Medications:    glipiZIDE (GLUCOTROL XL) 10 MG 24 hr tablet, Take 1 tablet by mouth 2 (two) times daily., Disp: , Rfl:    HYDROcodone-acetaminophen (NORCO/VICODIN) 5-325 MG tablet, Take 1 tablet by mouth 2 (two) times daily as needed for moderate pain. Reported on 06/15/2016 (Patient taking differently: Take 1 tablet by mouth every 6 (six) hours as needed for moderate pain.), Disp: 30 tablet, Rfl: 0   insulin NPH Human (NOVOLIN N) 100 UNIT/ML injection, Inject 25 Units into the skin at bedtime. 25 units every evening, Disp: , Rfl:    lisinopril (PRINIVIL,ZESTRIL) 2.5 MG tablet, Take 2.5 mg by mouth at bedtime., Disp: , Rfl:    LORazepam (ATIVAN) 1 MG tablet, Take 1 tablet (1 mg total) by mouth daily. (Patient taking differently: Take 1 mg by mouth 2 (two) times daily.), Disp: 15 tablet, Rfl: 0   metFORMIN (GLUCOPHAGE-XR) 500 MG 24 hr tablet, Take 1,000 mg by mouth 2 (two) times daily., Disp: , Rfl:    pioglitazone (ACTOS) 15 MG tablet, Take 15 mg by mouth daily., Disp: , Rfl:    pravastatin (PRAVACHOL) 10 MG tablet, Take 10 mg by mouth at bedtime. , Disp: , Rfl:    Semaglutide,0.25 or 0.'5MG'$ /DOS, 2 MG/1.5ML SOPN, Inject 0.25 mg into the skin every 7 (seven) days. , Disp: , Rfl:    tadalafil (CIALIS) 5 MG tablet, TAKE ONE TABLET BY MOUTH DAILY, Disp: 90 tablet, Rfl: 0   terbinafine (LAMISIL) 250 MG tablet, Take 250 mg by mouth at bedtime., Disp: , Rfl:   Current Facility-Administered Medications:    lidocaine (XYLOCAINE) 2 % jelly 1 application, 1 application, Urethral, Once, Stoioff, Scott C, MD  Physical exam: ECOG 1 Vitals:   08/04/21 1333  BP: 131/78  Pulse: 94  Resp: 18  Temp: 98.3 F (36.8 C)  SpO2: 95%   Weight: 245 lb 7 oz (111.3 kg)   Physical Exam Constitutional:      General: He is not in acute distress.    Appearance: Normal appearance. He is obese.  HENT:     Head: Normocephalic and atraumatic.  Eyes:     General: No scleral icterus.    Pupils: Pupils are equal, round, and reactive to light.  Cardiovascular:     Rate and Rhythm: Normal rate and regular rhythm.     Heart sounds: Normal heart sounds.  Pulmonary:     Effort: Pulmonary effort is normal. No respiratory distress.     Breath sounds: Normal breath sounds. No wheezing.  Abdominal:     General: Bowel sounds are normal. There is no distension.  Palpations: Abdomen is soft. There is no mass.     Tenderness: There is no abdominal tenderness.  Musculoskeletal:        General: No deformity. Normal range of motion.     Cervical back: Normal range of motion and neck supple.  Skin:    General: Skin is warm and dry.     Findings: No erythema or rash.  Neurological:     Mental Status: He is alert and oriented to person, place, and time. Mental status is at baseline.     Cranial Nerves: No cranial nerve deficit.     Coordination: Coordination normal.  Psychiatric:        Mood and Affect: Mood normal.     CMP Latest Ref Rng & Units 02/12/2020  Glucose 70 - 99 mg/dL 194(H)  BUN 8 - 23 mg/dL 15  Creatinine 0.61 - 1.24 mg/dL 1.11  Sodium 135 - 145 mmol/L 136  Potassium 3.5 - 5.1 mmol/L 4.2  Chloride 98 - 111 mmol/L 100  CO2 22 - 32 mmol/L 24  Calcium 8.9 - 10.3 mg/dL 9.6  Total Protein 6.5 - 8.1 g/dL 7.5  Total Bilirubin 0.3 - 1.2 mg/dL 0.8  Alkaline Phos 38 - 126 U/L 65  AST 15 - 41 U/L 19  ALT 0 - 44 U/L 22   CBC Latest Ref Rng & Units 08/04/2021  WBC 4.0 - 10.5 K/uL 10.2  Hemoglobin 13.0 - 17.0 g/dL 17.5(H)  Hematocrit 39.0 - 52.0 % 51.7  Platelets 150 - 400 K/uL 253   RADIOGRAPHIC STUDIES: I have personally reviewed the radiological images as listed and agreed with the findings in the report. No results  found.   Assessment and plan- Patient is a 70 y.o. male with history of secondary polycythemia likely secondary to obstructive sleep apnea   1. Erythrocytosis   2. OSA (obstructive sleep apnea)    #Secondary polycythemia likely due to OSA. Labs reviewed and discussed with patient Hematocrit is above 50.  Proceed with phlebotomy 500 cc x 1 today. Continue use of CPAP machine.    # Chronic intermittent leukocytosis.  Resolved.  Follow-up lab NP 3 months + phlebotomy. Lab MD 6 months phlebotomy+ phlebotomy  We spent sufficient time to discuss many aspect of care, questions were answered to patient's satisfaction.     Earlie Server, MD, PhD Hematology Oncology [facility] at [facility] Pager- [phone] 08/04/2021

## 2021-08-04 NOTE — Patient Instructions (Signed)

## 2021-08-04 NOTE — Progress Notes (Signed)
500 ml Blood removed per Phlebotomy orders. Pt tolerated procedure well. Accepted a beverage. VSS. Discharged to home.

## 2021-08-05 ENCOUNTER — Other Ambulatory Visit: Payer: Medicare Other

## 2021-08-05 ENCOUNTER — Ambulatory Visit: Payer: Medicare Other | Admitting: Oncology

## 2021-08-07 LAB — COMP PANEL: LEUKEMIA/LYMPHOMA

## 2021-09-21 ENCOUNTER — Other Ambulatory Visit: Payer: Self-pay | Admitting: Urology

## 2021-09-21 DIAGNOSIS — N401 Enlarged prostate with lower urinary tract symptoms: Secondary | ICD-10-CM

## 2021-10-30 ENCOUNTER — Other Ambulatory Visit: Payer: Self-pay

## 2021-10-30 DIAGNOSIS — D751 Secondary polycythemia: Secondary | ICD-10-CM

## 2021-11-03 ENCOUNTER — Other Ambulatory Visit: Payer: Self-pay

## 2021-11-03 ENCOUNTER — Inpatient Hospital Stay: Payer: Medicare Other

## 2021-11-03 ENCOUNTER — Inpatient Hospital Stay: Payer: Medicare Other | Attending: Nurse Practitioner | Admitting: Nurse Practitioner

## 2021-11-03 ENCOUNTER — Encounter: Payer: Self-pay | Admitting: Nurse Practitioner

## 2021-11-03 VITALS — BP 132/83 | HR 106 | Temp 98.4°F | Resp 18 | Wt 248.0 lb

## 2021-11-03 DIAGNOSIS — Z87442 Personal history of urinary calculi: Secondary | ICD-10-CM | POA: Diagnosis not present

## 2021-11-03 DIAGNOSIS — Z7984 Long term (current) use of oral hypoglycemic drugs: Secondary | ICD-10-CM | POA: Insufficient documentation

## 2021-11-03 DIAGNOSIS — D751 Secondary polycythemia: Secondary | ICD-10-CM | POA: Diagnosis not present

## 2021-11-03 DIAGNOSIS — G4730 Sleep apnea, unspecified: Secondary | ICD-10-CM | POA: Diagnosis not present

## 2021-11-03 DIAGNOSIS — E119 Type 2 diabetes mellitus without complications: Secondary | ICD-10-CM | POA: Diagnosis not present

## 2021-11-03 LAB — CBC WITH DIFFERENTIAL/PLATELET
Abs Immature Granulocytes: 0.04 10*3/uL (ref 0.00–0.07)
Basophils Absolute: 0.1 10*3/uL (ref 0.0–0.1)
Basophils Relative: 1 %
Eosinophils Absolute: 0.3 10*3/uL (ref 0.0–0.5)
Eosinophils Relative: 3 %
HCT: 51.5 % (ref 39.0–52.0)
Hemoglobin: 17.6 g/dL — ABNORMAL HIGH (ref 13.0–17.0)
Immature Granulocytes: 0 %
Lymphocytes Relative: 29 %
Lymphs Abs: 2.8 10*3/uL (ref 0.7–4.0)
MCH: 27.6 pg (ref 26.0–34.0)
MCHC: 34.2 g/dL (ref 30.0–36.0)
MCV: 80.8 fL (ref 80.0–100.0)
Monocytes Absolute: 0.8 10*3/uL (ref 0.1–1.0)
Monocytes Relative: 9 %
Neutro Abs: 5.6 10*3/uL (ref 1.7–7.7)
Neutrophils Relative %: 58 %
Platelets: 254 10*3/uL (ref 150–400)
RBC: 6.37 MIL/uL — ABNORMAL HIGH (ref 4.22–5.81)
RDW: 14.5 % (ref 11.5–15.5)
WBC: 9.6 10*3/uL (ref 4.0–10.5)
nRBC: 0 % (ref 0.0–0.2)

## 2021-11-03 LAB — COMPREHENSIVE METABOLIC PANEL
ALT: 22 U/L (ref 0–44)
AST: 26 U/L (ref 15–41)
Albumin: 4.3 g/dL (ref 3.5–5.0)
Alkaline Phosphatase: 56 U/L (ref 38–126)
Anion gap: 11 (ref 5–15)
BUN: 13 mg/dL (ref 8–23)
CO2: 20 mmol/L — ABNORMAL LOW (ref 22–32)
Calcium: 9.3 mg/dL (ref 8.9–10.3)
Chloride: 102 mmol/L (ref 98–111)
Creatinine, Ser: 1 mg/dL (ref 0.61–1.24)
GFR, Estimated: >60 mL/min (ref >60)
Glucose, Bld: 182 mg/dL — ABNORMAL HIGH (ref 70–99)
Potassium: 3.8 mmol/L (ref 3.5–5.1)
Sodium: 133 mmol/L — ABNORMAL LOW (ref 135–145)
Total Bilirubin: 0.8 mg/dL (ref 0.3–1.2)
Total Protein: 7.6 g/dL (ref 6.5–8.1)

## 2021-11-03 NOTE — Patient Instructions (Signed)

## 2021-11-03 NOTE — Progress Notes (Signed)
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  BCR able and Jak 2 mutation testing was negative in 2011.  He does have a history of obstructive sleep apnea and uses CPAP.  He used to follow up with [name].  He was initially getting phlebotomy every 2 weeks but now gets it every 2 months if need be.  His threshold for phlebotomy was  a hematocrit of greater than 47.  patient previously follows up with [name]. He switches care to me on 01/15/2019.  06/15/2016 JAK2 V617F negative, Exons 12-15 mutation negative.  06/15/2017 Erythropoietin 12.8.  05/15/2019 further testing including MPL, carl mutation which both are negative  On Phlebotomy PRN if Hct >50 now.   Interval history- SHAKIL DIRK, 70 y.o. male with above history of erythrocytosis who returns to clinic for labs and follow up. He continues to use CPAP regularly. Energy is stable but improves post phlebotomy. No other shortness of breath or chest pain.   Review of systems- Review of Systems  Constitutional:  Negative for chills, fever, malaise/fatigue and weight loss.  HENT:  Negative for sore throat.   Eyes:  Negative for redness.  Respiratory:  Negative for cough, shortness of breath and wheezing.   Cardiovascular:  Negative for chest pain, palpitations and leg swelling.  Gastrointestinal:  Negative for abdominal pain, blood in stool, nausea and vomiting.  Genitourinary:  Negative for dysuria.  Musculoskeletal:  Negative for myalgias.  Skin:  Negative for rash.   Neurological:  Negative for dizziness, tingling and tremors.  Endo/Heme/Allergies:  Does not bruise/bleed easily.  Psychiatric/Behavioral:  Negative for hallucinations.      Allergies  Allergen Reactions   Sertraline Hcl Other (See Comments)    Vivid dreams    Past Medical History:  Diagnosis Date   Anxiety    Arthritis    Diabetes mellitus without complication ([place])    Erythrocytosis 05/18/2015   Erythrocytosis    History of kidney stones    Hydronephrosis with ureteropelvic junction (UPJ) obstruction 12/06/2017   Per [facility] Flow w Pharm order   Polycythemia    Sleep apnea    USES CPAP    Past Surgical History:  Procedure Laterality Date   BLADDER STONE REMOVAL     cataracts Bilateral    COLONOSCOPY N/A 06/02/2015   Procedure: COLONOSCOPY;  Surgeon: [name], MD;  Location: [facility] ENDOSCOPY;  Service: Endoscopy;  Laterality: N/A;   CYSTOSCOPY W/ RETROGRADES Left 11/01/2017   Procedure: CYSTOSCOPY WITH RETROGRADE PYELOGRAM;  Surgeon: [name], MD;  Location: [facility] ORS;  Service: Urology;  Laterality: Left;   CYSTOSCOPY WITH STENT PLACEMENT Left 11/01/2017   Procedure: CYSTOSCOPY WITH STENT PLACEMENT;  Surgeon: [name], MD;  Location: [facility] ORS;  Service: Urology;  Laterality: Left;    Social History   Socioeconomic History   Marital status: Married    Spouse name: Not on file   Number of children: Not on file   Years of education: Not on file   Highest education level: Not on file  Occupational History   Not  on file  Tobacco Use   Smoking status: Never   Smokeless tobacco: Never  Vaping Use   Vaping Use: Never used  Substance and Sexual Activity   Alcohol use: No   Drug use: No   Sexual activity: Not on file  Other Topics Concern   Not on file  Social History Narrative   Not on file   Social Determinants of Health   Financial Resource Strain: Not on file  Food Insecurity: Not on file  Transportation Needs: Not on file   Physical Activity: Not on file  Stress: Not on file  Social Connections: Not on file  Intimate Partner Violence: Not on file    Family History  Problem Relation Age of Onset   Heart disease Mother    Diabetes Father     Current Outpatient Medications:    glipiZIDE (GLUCOTROL XL) 10 MG 24 hr tablet, Take 1 tablet by mouth 2 (two) times daily., Disp: , Rfl:    HYDROcodone-acetaminophen (NORCO/VICODIN) 5-325 MG tablet, Take 1 tablet by mouth 2 (two) times daily as needed for moderate pain. Reported on 06/15/2016 (Patient taking differently: Take 1 tablet by mouth every 6 (six) hours as needed for moderate pain.), Disp: 30 tablet, Rfl: 0   insulin NPH Human (NOVOLIN N) 100 UNIT/ML injection, Inject 25 Units into the skin at bedtime. 25 units every evening, Disp: , Rfl:    lisinopril (PRINIVIL,ZESTRIL) 2.5 MG tablet, Take 2.5 mg by mouth at bedtime., Disp: , Rfl:    LORazepam (ATIVAN) 1 MG tablet, Take 1 tablet (1 mg total) by mouth daily. (Patient taking differently: Take 1 mg by mouth 2 (two) times daily.), Disp: 15 tablet, Rfl: 0   metFORMIN (GLUCOPHAGE-XR) 500 MG 24 hr tablet, Take 1,000 mg by mouth 2 (two) times daily., Disp: , Rfl:    pioglitazone (ACTOS) 15 MG tablet, Take 15 mg by mouth daily., Disp: , Rfl:    pravastatin (PRAVACHOL) 10 MG tablet, Take 10 mg by mouth at bedtime. , Disp: , Rfl:    Semaglutide,0.25 or 0.5MG /DOS, 2 MG/1.5ML SOPN, Inject 0.25 mg into the skin every 7 (seven) days. , Disp: , Rfl:    tadalafil (CIALIS) 5 MG tablet, TAKE ONE TABLET BY MOUTH DAILY, Disp: 90 tablet, Rfl: 0   terbinafine (LAMISIL) 250 MG tablet, Take 250 mg by mouth at bedtime. (Patient not taking: Reported on 11/03/2021), Disp: , Rfl:   Current Facility-Administered Medications:    lidocaine (XYLOCAINE) 2 % jelly 1 application, 1 application, Urethral, Once, Stoioff, Scott C, MD  Physical exam: ECOG 1 Vitals:   11/03/21 1324  BP: 132/83  Pulse: (!) 106  Resp: 18  Temp: 98.4 F (36.9 C)   TempSrc: Tympanic  SpO2: 97%  Weight: 248 lb (112.5 kg)    Physical Exam Constitutional:      General: He is not in acute distress.    Appearance: He is well-developed. He is not ill-appearing.  HENT:     Head: Normocephalic and atraumatic.     Mouth/Throat:     Pharynx: No oropharyngeal exudate.  Eyes:     General: No scleral icterus.    Conjunctiva/sclera: Conjunctivae normal.  Cardiovascular:     Rate and Rhythm: Normal rate and regular rhythm.  Pulmonary:     Effort: Pulmonary effort is normal.     Breath sounds: No wheezing.  Musculoskeletal:     Comments: ambulatory  Skin:    General: Skin is warm and dry.     Coloration: Skin  is not pale.  Neurological:     Mental Status: He is alert and oriented to person, place, and time.  Psychiatric:        Behavior: Behavior normal.     CMP Latest Ref Rng & Units 11/03/2021  Glucose 70 - 99 mg/dL 182(H)  BUN 8 - 23 mg/dL 13  Creatinine 0.61 - 1.24 mg/dL 1.00  Sodium 135 - 145 mmol/L 133(L)  Potassium 3.5 - 5.1 mmol/L 3.8  Chloride 98 - 111 mmol/L 102  CO2 22 - 32 mmol/L 20(L)  Calcium 8.9 - 10.3 mg/dL 9.3  Total Protein 6.5 - 8.1 g/dL 7.6  Total Bilirubin 0.3 - 1.2 mg/dL 0.8  Alkaline Phos 38 - 126 U/L 56  AST 15 - 41 U/L 26  ALT 0 - 44 U/L 22   CBC Latest Ref Rng & Units 11/03/2021  WBC 4.0 - 10.5 K/uL 9.6  Hemoglobin 13.0 - 17.0 g/dL 17.6(H)  Hematocrit 39.0 - 52.0 % 51.5  Platelets 150 - 400 K/uL 254    RADIOGRAPHIC STUDIES: I have personally reviewed the radiological images as listed and agreed with the findings in the report. No results found.   Assessment and plan- Patient is a 71 y.o. male   Secondary polycythemia- likely due to OSA. Hematocrit goal < 50. Today's labs reviewed. Hematocrit 51.5. Proceed with 500 cc phlebotomy today.  Chronic intermittent leukocytosis- resolved.   Disposition- 3 months- lab (cbc, cmp), Dr Tasia Catchings, +/- phlebotomy  1. Secondary erythrocytosis    We spent sufficient time to  discuss many aspect of care, questions were answered to patient's satisfaction.  Beckey Rutter, DNP, AGNP-C [place] [facility] [phone] (clinic) 11/03/2021

## 2021-11-03 NOTE — Progress Notes (Signed)
500  ml of blood removed per orders. Pt tolerated therapeutic phlebotomy well. Accepted a beverage. Reports feeling at his baseline. VSS. Discharged to home.

## 2021-11-05 ENCOUNTER — Ambulatory Visit: Payer: Medicare Other | Admitting: Nurse Practitioner

## 2021-11-05 ENCOUNTER — Other Ambulatory Visit: Payer: Medicare Other

## 2022-02-02 ENCOUNTER — Encounter: Payer: Self-pay | Admitting: Oncology

## 2022-02-02 ENCOUNTER — Inpatient Hospital Stay (HOSPITAL_BASED_OUTPATIENT_CLINIC_OR_DEPARTMENT_OTHER): Payer: Medicare Other | Admitting: Oncology

## 2022-02-02 ENCOUNTER — Other Ambulatory Visit: Payer: Self-pay

## 2022-02-02 ENCOUNTER — Inpatient Hospital Stay: Payer: Medicare Other | Attending: Oncology

## 2022-02-02 ENCOUNTER — Inpatient Hospital Stay: Payer: Medicare Other

## 2022-02-02 VITALS — BP 156/80 | HR 99 | Temp 97.6°F | Resp 18 | Wt 248.6 lb

## 2022-02-02 VITALS — BP 143/85 | HR 90 | Resp 18

## 2022-02-02 DIAGNOSIS — Z87442 Personal history of urinary calculi: Secondary | ICD-10-CM | POA: Insufficient documentation

## 2022-02-02 DIAGNOSIS — D751 Secondary polycythemia: Secondary | ICD-10-CM

## 2022-02-02 DIAGNOSIS — G4730 Sleep apnea, unspecified: Secondary | ICD-10-CM | POA: Diagnosis not present

## 2022-02-02 DIAGNOSIS — E119 Type 2 diabetes mellitus without complications: Secondary | ICD-10-CM | POA: Insufficient documentation

## 2022-02-02 DIAGNOSIS — M129 Arthropathy, unspecified: Secondary | ICD-10-CM | POA: Diagnosis not present

## 2022-02-02 DIAGNOSIS — Z7984 Long term (current) use of oral hypoglycemic drugs: Secondary | ICD-10-CM | POA: Diagnosis not present

## 2022-02-02 DIAGNOSIS — G4733 Obstructive sleep apnea (adult) (pediatric): Secondary | ICD-10-CM | POA: Diagnosis not present

## 2022-02-02 DIAGNOSIS — Z79899 Other long term (current) drug therapy: Secondary | ICD-10-CM | POA: Diagnosis not present

## 2022-02-02 LAB — COMPREHENSIVE METABOLIC PANEL
ALT: 21 U/L (ref 0–44)
AST: 22 U/L (ref 15–41)
Albumin: 4 g/dL (ref 3.5–5.0)
Alkaline Phosphatase: 56 U/L (ref 38–126)
Anion gap: 12 (ref 5–15)
BUN: 20 mg/dL (ref 8–23)
CO2: 24 mmol/L (ref 22–32)
Calcium: 9.2 mg/dL (ref 8.9–10.3)
Chloride: 99 mmol/L (ref 98–111)
Creatinine, Ser: 1.04 mg/dL (ref 0.61–1.24)
GFR, Estimated: >60 mL/min (ref >60)
Glucose, Bld: 194 mg/dL — ABNORMAL HIGH (ref 70–99)
Potassium: 3.8 mmol/L (ref 3.5–5.1)
Sodium: 135 mmol/L (ref 135–145)
Total Bilirubin: 0.5 mg/dL (ref 0.3–1.2)
Total Protein: 7.5 g/dL (ref 6.5–8.1)

## 2022-02-02 LAB — CBC
HCT: 52.4 % — ABNORMAL HIGH (ref 39.0–52.0)
Hemoglobin: 17.1 g/dL — ABNORMAL HIGH (ref 13.0–17.0)
MCH: 27 pg (ref 26.0–34.0)
MCHC: 32.6 g/dL (ref 30.0–36.0)
MCV: 82.8 fL (ref 80.0–100.0)
Platelets: 253 10*3/uL (ref 150–400)
RBC: 6.33 MIL/uL — ABNORMAL HIGH (ref 4.22–5.81)
RDW: 14.9 % (ref 11.5–15.5)
WBC: 10.8 10*3/uL — ABNORMAL HIGH (ref 4.0–10.5)
nRBC: 0 % (ref 0.0–0.2)

## 2022-02-02 MED ORDER — SODIUM CHLORIDE 0.9 % IV SOLN
Freq: Once | INTRAVENOUS | Status: AC
  Filled 2022-02-02: qty 250

## 2022-02-02 NOTE — Progress Notes (Signed)
Removed 500 ml of blood for therapeutic phlebotomy. Dr Tasia Catchings requested 500 ml of NS IV replacement. Pt tolerated procedure well. VSS. Discharged to home feeling well.

## 2022-02-02 NOTE — Patient Instructions (Signed)

## 2022-02-02 NOTE — Progress Notes (Signed)
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  BCR able and Jak 2 mutation testing was negative in 2011.  He does have a history of obstructive sleep apnea and uses CPAP.  He used to follow up with [name].  He was initially getting phlebotomy every 2 weeks but now gets it every 2 months if need be.  His threshold for phlebotomy was  a hematocrit of greater than 47.  patient previously follows up with [name]. He switches care to me on 01/15/2019.  06/15/2016 JAK2 V617F negative, Exons 12-15 mutation negative.  06/15/2017 Erythropoietin 12.8.  05/15/2019 further testing including MPL, carl mutation which both are negative  On Phlebotomy PRN if Hct >50 now.   Interval history-  71 y.o. male with a history of erythrocytosis present to follow-up.  Patient has no new complaints today. Patient uses his CPAP machine every day.patient reports feeling subjectively better after phlebotomy treatments.   Patient says that he needs to get his CPAP machine mask exchanged.  Denies any shortness of breath or chest pain  Review of systems- Review of Systems  Constitutional:  Negative for chills, fever, malaise/fatigue and weight loss.  HENT:  Negative for sore throat.   Eyes:  Negative for redness.  Respiratory:  Negative for cough, shortness of breath and wheezing.   Cardiovascular:  Negative for chest pain, palpitations and leg swelling.  Gastrointestinal:  Negative for abdominal pain, blood in stool, nausea and vomiting.  Genitourinary:  Negative for dysuria.   Musculoskeletal:  Negative for myalgias.  Skin:  Negative for rash.  Neurological:  Negative for dizziness, tingling and tremors.  Endo/Heme/Allergies:  Does not bruise/bleed easily.  Psychiatric/Behavioral:  Negative for hallucinations.       Allergies  Allergen Reactions   Sertraline Hcl Other (See Comments)    Vivid dreams     Past Medical History:  Diagnosis Date   Anxiety    Arthritis    Diabetes mellitus without complication ([place])    Erythrocytosis 05/18/2015   Erythrocytosis    History of kidney stones    Hydronephrosis with ureteropelvic junction (UPJ) obstruction 12/06/2017   Per [facility] Flow w Pharm order   Polycythemia    Sleep apnea    USES CPAP     Past Surgical History:  Procedure Laterality Date   BLADDER STONE REMOVAL     cataracts Bilateral    COLONOSCOPY N/A 06/02/2015   Procedure: COLONOSCOPY;  Surgeon: [name], MD;  Location: [facility] ENDOSCOPY;  Service: Endoscopy;  Laterality: N/A;   CYSTOSCOPY W/ RETROGRADES Left 11/01/2017   Procedure: CYSTOSCOPY WITH RETROGRADE PYELOGRAM;  Surgeon: [name], MD;  Location: [facility] ORS;  Service: Urology;  Laterality: Left;   CYSTOSCOPY WITH STENT PLACEMENT Left 11/01/2017   Procedure: CYSTOSCOPY WITH STENT PLACEMENT;  Surgeon: [name], MD;  Location: [facility] ORS;  Service: Urology;  Laterality: Left;    Social History   Socioeconomic History   Marital status: Married    Spouse name: Not on file   Number of children: Not on file   Years of education: Not  on file   Highest education level: Not on file  Occupational History   Not on file  Tobacco Use   Smoking status: Never   Smokeless tobacco: Never  Vaping Use   Vaping Use: Never used  Substance and Sexual Activity   Alcohol use: No   Drug use: No   Sexual activity: Not on file  Other Topics Concern   Not on file  Social History Narrative   Not on file   Social Determinants of Health   Financial Resource Strain: Not  on file  Food Insecurity: Not on file  Transportation Needs: Not on file  Physical Activity: Not on file  Stress: Not on file  Social Connections: Not on file  Intimate Partner Violence: Not on file    Family History  Problem Relation Age of Onset   Heart disease Mother    Diabetes Father      Current Outpatient Medications:    glipiZIDE (GLUCOTROL XL) 10 MG 24 hr tablet, Take 1 tablet by mouth 2 (two) times daily., Disp: , Rfl:    HYDROcodone-acetaminophen (NORCO/VICODIN) 5-325 MG tablet, Take 1 tablet by mouth 2 (two) times daily as needed for moderate pain. Reported on 06/15/2016 (Patient taking differently: Take 1 tablet by mouth every 6 (six) hours as needed for moderate pain.), Disp: 30 tablet, Rfl: 0   insulin NPH Human (NOVOLIN N) 100 UNIT/ML injection, Inject 25 Units into the skin at bedtime. 25 units every evening, Disp: , Rfl:    lisinopril (PRINIVIL,ZESTRIL) 2.5 MG tablet, Take 2.5 mg by mouth at bedtime., Disp: , Rfl:    LORazepam (ATIVAN) 1 MG tablet, Take 1 tablet (1 mg total) by mouth daily. (Patient taking differently: Take 1 mg by mouth 2 (two) times daily.), Disp: 15 tablet, Rfl: 0   metFORMIN (GLUCOPHAGE-XR) 500 MG 24 hr tablet, Take 1,000 mg by mouth 2 (two) times daily., Disp: , Rfl:    pioglitazone (ACTOS) 15 MG tablet, Take 15 mg by mouth daily., Disp: , Rfl:    pravastatin (PRAVACHOL) 10 MG tablet, Take 10 mg by mouth at bedtime. , Disp: , Rfl:    Semaglutide,0.25 or 0.5MG /DOS, 2 MG/1.5ML SOPN, Inject 0.25 mg into the skin every 7 (seven) days. , Disp: , Rfl:    tadalafil (CIALIS) 5 MG tablet, TAKE ONE TABLET BY MOUTH DAILY, Disp: 90 tablet, Rfl: 0   terbinafine (LAMISIL) 250 MG tablet, Take 250 mg by mouth at bedtime., Disp: , Rfl:   Current Facility-Administered Medications:    lidocaine (XYLOCAINE) 2 % jelly 1 application, 1 application, Urethral, Once, Stoioff, Scott C, MD  Physical exam: ECOG 1 Vitals:   02/02/22 1318  BP: (!) 156/80  Pulse: 99   Resp: 18  Temp: 97.6 F (36.4 C)  Weight: 248 lb 9.6 oz (112.8 kg)   Physical Exam Constitutional:      General: He is not in acute distress.    Appearance: Normal appearance. He is obese.  HENT:     Head: Normocephalic and atraumatic.  Eyes:     General: No scleral icterus.    Pupils: Pupils are equal, round, and reactive to light.  Cardiovascular:     Rate and Rhythm: Normal rate and regular rhythm.     Heart sounds: Normal heart sounds.  Pulmonary:     Effort: Pulmonary effort is normal. No respiratory distress.     Breath sounds: Normal breath sounds. No wheezing.  Abdominal:     General: Bowel sounds are normal. There is  no distension.     Palpations: Abdomen is soft. There is no mass.     Tenderness: There is no abdominal tenderness.  Musculoskeletal:        General: No deformity. Normal range of motion.     Cervical back: Normal range of motion and neck supple.  Skin:    General: Skin is warm and dry.     Findings: No erythema or rash.  Neurological:     Mental Status: He is alert and oriented to person, place, and time. Mental status is at baseline.     Cranial Nerves: No cranial nerve deficit.     Coordination: Coordination normal.  Psychiatric:        Mood and Affect: Mood normal.     CMP Latest Ref Rng & Units 02/02/2022  Glucose 70 - 99 mg/dL 194(H)  BUN 8 - 23 mg/dL 20  Creatinine 0.61 - 1.24 mg/dL 1.04  Sodium 135 - 145 mmol/L 135  Potassium 3.5 - 5.1 mmol/L 3.8  Chloride 98 - 111 mmol/L 99  CO2 22 - 32 mmol/L 24  Calcium 8.9 - 10.3 mg/dL 9.2  Total Protein 6.5 - 8.1 g/dL 7.5  Total Bilirubin 0.3 - 1.2 mg/dL 0.5  Alkaline Phos 38 - 126 U/L 56  AST 15 - 41 U/L 22  ALT 0 - 44 U/L 21   CBC Latest Ref Rng & Units 02/02/2022  WBC 4.0 - 10.5 K/uL 10.8(H)  Hemoglobin 13.0 - 17.0 g/dL 17.1(H)  Hematocrit 39.0 - 52.0 % 52.4(H)  Platelets 150 - 400 K/uL 253   RADIOGRAPHIC STUDIES: I have personally reviewed the radiological images as listed and agreed  with the findings in the report. No results found.   Assessment and plan- Patient is a 71 y.o. male with history of secondary polycythemia likely secondary to obstructive sleep apnea   1. Secondary erythrocytosis   2. OSA (obstructive sleep apnea)    #Secondary polycythemia likely due to OSA. Labs reviewed and discussed with patient. Discussed with that I will increase the phlebotomy schedule to be > 52, given that erythrocytosis is primarily secondary and lowering his hematocrit level to low could worsen his symptoms.  Also recommend saline to be given back after phlebotomy.  He agrees with the plan Hematocrit is above 52 today.  Proceed with phlebotomy 500 cc x 1 today. Continue use of CPAP machine and recommend patient to have CPAP every meter tested. Follow-up lab NP 3 months + phlebotomy. Lab MD 6 months phlebotomy+ phlebotomy  We spent sufficient time to discuss many aspect of care, questions were answered to patient's satisfaction.     Earlie Server, MD, PhD Hematology Oncology 02/02/2022

## 2022-02-15 ENCOUNTER — Other Ambulatory Visit: Payer: Self-pay | Admitting: Urology

## 2022-02-15 DIAGNOSIS — N401 Enlarged prostate with lower urinary tract symptoms: Secondary | ICD-10-CM

## 2022-05-04 ENCOUNTER — Encounter: Payer: Self-pay | Admitting: Nurse Practitioner

## 2022-05-04 ENCOUNTER — Inpatient Hospital Stay: Payer: Medicare Other | Attending: Oncology

## 2022-05-04 ENCOUNTER — Inpatient Hospital Stay: Payer: Medicare Other | Admitting: Nurse Practitioner

## 2022-05-04 ENCOUNTER — Inpatient Hospital Stay: Payer: Medicare Other

## 2022-05-04 VITALS — BP 118/72 | HR 100 | Temp 98.4°F | Resp 16 | Wt 246.0 lb

## 2022-05-04 VITALS — BP 119/65 | HR 90 | Resp 17

## 2022-05-04 DIAGNOSIS — G4730 Sleep apnea, unspecified: Secondary | ICD-10-CM | POA: Insufficient documentation

## 2022-05-04 DIAGNOSIS — D751 Secondary polycythemia: Secondary | ICD-10-CM | POA: Insufficient documentation

## 2022-05-04 DIAGNOSIS — Z87442 Personal history of urinary calculi: Secondary | ICD-10-CM | POA: Insufficient documentation

## 2022-05-04 DIAGNOSIS — E119 Type 2 diabetes mellitus without complications: Secondary | ICD-10-CM | POA: Diagnosis not present

## 2022-05-04 DIAGNOSIS — Z7984 Long term (current) use of oral hypoglycemic drugs: Secondary | ICD-10-CM | POA: Insufficient documentation

## 2022-05-04 DIAGNOSIS — Z79899 Other long term (current) drug therapy: Secondary | ICD-10-CM | POA: Diagnosis not present

## 2022-05-04 LAB — COMPREHENSIVE METABOLIC PANEL
ALT: 20 U/L (ref 0–44)
AST: 22 U/L (ref 15–41)
Albumin: 3.9 g/dL (ref 3.5–5.0)
Alkaline Phosphatase: 49 U/L (ref 38–126)
Anion gap: 10 (ref 5–15)
BUN: 23 mg/dL (ref 8–23)
CO2: 20 mmol/L — ABNORMAL LOW (ref 22–32)
Calcium: 8.9 mg/dL (ref 8.9–10.3)
Chloride: 100 mmol/L (ref 98–111)
Creatinine, Ser: 0.98 mg/dL (ref 0.61–1.24)
GFR, Estimated: >60 mL/min (ref >60)
Glucose, Bld: 186 mg/dL — ABNORMAL HIGH (ref 70–99)
Potassium: 4 mmol/L (ref 3.5–5.1)
Sodium: 130 mmol/L — ABNORMAL LOW (ref 135–145)
Total Bilirubin: 0.5 mg/dL (ref 0.3–1.2)
Total Protein: 7.3 g/dL (ref 6.5–8.1)

## 2022-05-04 LAB — CBC WITH DIFFERENTIAL/PLATELET
Abs Immature Granulocytes: 0.05 10*3/uL (ref 0.00–0.07)
Basophils Absolute: 0.1 10*3/uL (ref 0.0–0.1)
Basophils Relative: 1 %
Eosinophils Absolute: 0.5 10*3/uL (ref 0.0–0.5)
Eosinophils Relative: 5 %
HCT: 52.2 % — ABNORMAL HIGH (ref 39.0–52.0)
Hemoglobin: 17.7 g/dL — ABNORMAL HIGH (ref 13.0–17.0)
Immature Granulocytes: 1 %
Lymphocytes Relative: 27 %
Lymphs Abs: 2.9 10*3/uL (ref 0.7–4.0)
MCH: 27.7 pg (ref 26.0–34.0)
MCHC: 33.9 g/dL (ref 30.0–36.0)
MCV: 81.8 fL (ref 80.0–100.0)
Monocytes Absolute: 1.1 10*3/uL — ABNORMAL HIGH (ref 0.1–1.0)
Monocytes Relative: 10 %
Neutro Abs: 6.3 10*3/uL (ref 1.7–7.7)
Neutrophils Relative %: 56 %
Platelets: 251 10*3/uL (ref 150–400)
RBC: 6.38 MIL/uL — ABNORMAL HIGH (ref 4.22–5.81)
RDW: 13.9 % (ref 11.5–15.5)
WBC: 10.9 10*3/uL — ABNORMAL HIGH (ref 4.0–10.5)
nRBC: 0 % (ref 0.0–0.2)

## 2022-05-04 MED ORDER — SODIUM CHLORIDE 0.9 % IV SOLN
Freq: Once | INTRAVENOUS | Status: AC
  Filled 2022-05-04: qty 250

## 2022-05-04 NOTE — Progress Notes (Signed)
Therapeutic phlebotomy performed by removing 500 ml of blood from R AC. Replacement of Fluids with 500 ml NS. Feeling well. ?

## 2022-05-04 NOTE — Progress Notes (Signed)
?Hematology/Oncology Progress Note ?[phone] [phone] ?  ?Patient Care Team: ?[place] Pink, MD as PCP - General (Family Medicine)  ? ?Name of the patient: Benjamin [name]  ?[id number]  ?08-Apr-1951  ? ?Date of visit: 05/04/22 ? 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 ?Chief complaint/ Reason for visit- evaluate need for phlebotomy ? ?Heme/Onc history: Patient is a 71 year old male with a history of secondary polycythemia since 2005.  BCR able and Jak 2 mutation testing was negative in 2011.  He does have a history of obstructive sleep apnea and uses CPAP.  He used to follow up with [name].  He was initially getting phlebotomy every 2 weeks but now gets it every 2 months if need be.  His threshold for phlebotomy was  a hematocrit of greater than 47.  ?patient previously follows up with [name]. He switches care to me on 01/15/2019. ? ?06/15/2016 JAK2 V617F negative, Exons 12-15 mutation negative.  ?06/15/2017 Erythropoietin 12.8.  ?05/15/2019 further testing including MPL, carl mutation which both are negative ? ?On Phlebotomy PRN if Hct >50 now.  ? ?Interval history- Benjamin [name] is a 71 y.o. male with a history of erythrocytosis present to follow-up.  ?Patient has no new complaints today. Patient uses his CPAP machine every day. Feels 'better' after phlebotomy treatments. Denies any shortness of breath or chest pain ? ?Review of systems- Review of Systems  ?Constitutional:  Negative for chills, fever, malaise/fatigue and weight loss.  ?HENT:  Negative for sore throat.   ?Eyes:  Negative for redness.  ?Respiratory:  Negative for cough, shortness of breath and wheezing.   ?Cardiovascular:  Negative for chest pain, palpitations and leg swelling.  ?Gastrointestinal:  Negative for abdominal pain, blood in stool, nausea and vomiting.  ?Genitourinary:  Negative for dysuria.  ?Musculoskeletal:  Negative for myalgias.  ?Skin:  Negative for rash.  ?Neurological:  Negative  for dizziness, tingling and tremors.  ?Endo/Heme/Allergies:  Does not bruise/bleed easily.  ?Psychiatric/Behavioral:  Negative for hallucinations.    ? ? ?Allergies  ?Allergen Reactions  ? Sertraline Hcl Other (See Comments)  ?  Vivid dreams  ? ? ?Past Medical History:  ?Diagnosis Date  ? Anxiety   ? Arthritis   ? Diabetes mellitus without complication ([place])   ? Erythrocytosis 05/18/2015  ? Erythrocytosis   ? History of kidney stones   ? Hydronephrosis with ureteropelvic junction (UPJ) obstruction 12/06/2017  ? Per [facility] Flow w Pharm order  ? Polycythemia   ? Sleep apnea   ? USES CPAP  ? ? ? ?Past Surgical History:  ?Procedure Laterality Date  ? BLADDER STONE REMOVAL    ? cataracts Bilateral   ? COLONOSCOPY N/A 06/02/2015  ? Procedure: COLONOSCOPY;  Surgeon: [name], MD;  Location: [facility] ENDOSCOPY;  Service: Endoscopy;  Laterality: N/A;  ? CYSTOSCOPY W/ RETROGRADES Left 11/01/2017  ? Procedure: CYSTOSCOPY WITH RETROGRADE PYELOGRAM;  Surgeon: [name], MD;  Location: [facility] ORS;  Service: Urology;  Laterality: Left;  ? CYSTOSCOPY WITH STENT PLACEMENT Left 11/01/2017  ? Procedure: CYSTOSCOPY WITH STENT PLACEMENT;  Surgeon: [name], MD;  Location: [facility] ORS;  Service: Urology;  Laterality: Left;  ? ? ?Social History  ? ?Socioeconomic History  ? Marital status: Married  ?  Spouse name: Not on file  ? Number of children: Not on file  ? Years of education: Not on file  ? Highest education level: Not on file  ?Occupational History  ? Not on file  ?  Tobacco Use  ? Smoking status: Never  ? Smokeless tobacco: Never  ?Vaping Use  ? Vaping Use: Never used  ?Substance and Sexual Activity  ? Alcohol use: No  ? Drug use: No  ? Sexual activity: Not on file  ?Other Topics Concern  ? Not on file  ?Social History Narrative  ? Not on file  ? ?Social Determinants of Health  ? ?Financial Resource Strain: Not on file  ?Food Insecurity: Not on file  ?Transportation Needs: Not on file  ?Physical Activity: Not  on file  ?Stress: Not on file  ?Social Connections: Not on file  ?Intimate Partner Violence: Not on file  ? ? ?Family History  ?Problem Relation Age of Onset  ? Heart disease Mother   ? Diabetes Father   ? ? ? ?Current Outpatient Medications:  ?  glipiZIDE (GLUCOTROL XL) 10 MG 24 hr tablet, Take 1 tablet by mouth 2 (two) times daily., Disp: , Rfl:  ?  HYDROcodone-acetaminophen (NORCO/VICODIN) 5-325 MG tablet, Take 1 tablet by mouth 2 (two) times daily as needed for moderate pain. Reported on 06/15/2016 (Patient taking differently: Take 1 tablet by mouth every 6 (six) hours as needed for moderate pain.), Disp: 30 tablet, Rfl: 0 ?  insulin NPH Human (NOVOLIN N) 100 UNIT/ML injection, Inject 25 Units into the skin at bedtime. 25 units every evening, Disp: , Rfl:  ?  lisinopril (PRINIVIL,ZESTRIL) 2.5 MG tablet, Take 2.5 mg by mouth at bedtime., Disp: , Rfl:  ?  LORazepam (ATIVAN) 1 MG tablet, Take 1 tablet (1 mg total) by mouth daily. (Patient taking differently: Take 1 mg by mouth 2 (two) times daily.), Disp: 15 tablet, Rfl: 0 ?  metFORMIN (GLUCOPHAGE-XR) 500 MG 24 hr tablet, Take 1,000 mg by mouth 2 (two) times daily., Disp: , Rfl:  ?  pioglitazone (ACTOS) 15 MG tablet, Take 15 mg by mouth daily., Disp: , Rfl:  ?  pravastatin (PRAVACHOL) 10 MG tablet, Take 10 mg by mouth at bedtime. , Disp: , Rfl:  ?  Semaglutide,0.25 or 0.'5MG'$ /DOS, 2 MG/1.5ML SOPN, Inject 0.25 mg into the skin every 7 (seven) days. , Disp: , Rfl:  ?  tadalafil (CIALIS) 5 MG tablet, TAKE ONE TABLET BY MOUTH DAILY, Disp: 90 tablet, Rfl: 0 ? ?Current Facility-Administered Medications:  ?  lidocaine (XYLOCAINE) 2 % jelly 1 application, 1 application., Urethral, Once, Stoioff, Ronda Fairly, MD ? ?Facility-Administered Medications Ordered in Other Visits:  ?  0.9 %  sodium chloride infusion, , Intravenous, Once, Earlie Server, MD ? ?Physical exam: ECOG 1 ?Vitals:  ? 05/04/22 1345 05/04/22 1350  ?BP:  118/72  ?Pulse: 100   ?Resp: 16   ?Temp: 98.4 ?F (36.9 ?C)    ?TempSrc: Tympanic   ?SpO2: 95%   ?Weight: 246 lb (111.6 kg)   ? ?Physical Exam ?Vitals reviewed.  ?Constitutional:   ?   Appearance: He is obese. He is not ill-appearing.  ?Pulmonary:  ?   Effort: No respiratory distress.  ?Skin: ?   Coloration: Skin is not pale.  ?   Comments: Ruddy skin tone  ?Neurological:  ?   Mental Status: He is alert and oriented to person, place, and time.  ?Psychiatric:     ?   Mood and Affect: Mood normal.     ?   Behavior: Behavior normal.  ?  ? ? ?  Latest Ref Rng & Units 05/04/2022  ?  1:12 PM  ?CMP  ?Glucose 70 - 99 mg/dL 186    ?BUN  8 - 23 mg/dL 23    ?Creatinine 0.61 - 1.24 mg/dL 0.98    ?Sodium 135 - 145 mmol/L 130    ?Potassium 3.5 - 5.1 mmol/L 4.0    ?Chloride 98 - 111 mmol/L 100    ?CO2 22 - 32 mmol/L 20    ?Calcium 8.9 - 10.3 mg/dL 8.9    ?Total Protein 6.5 - 8.1 g/dL 7.3    ?Total Bilirubin 0.3 - 1.2 mg/dL 0.5    ?Alkaline Phos 38 - 126 U/L 49    ?AST 15 - 41 U/L 22    ?ALT 0 - 44 U/L 20    ? ? ?  Latest Ref Rng & Units 05/04/2022  ?  1:12 PM  ?CBC  ?WBC 4.0 - 10.5 K/uL 10.9    ?Hemoglobin 13.0 - 17.0 g/dL 17.7    ?Hematocrit 39.0 - 52.0 % 52.2    ?Platelets 150 - 400 K/uL 251    ? ?RADIOGRAPHIC STUDIES: ?I have personally reviewed the radiological images as listed and agreed with the findings in the report. ?No results found. ? ? ?Assessment and plan- Patient is a 71 y.o. male with history of secondary polycythemia likely secondary to obstructive sleep apnea  ? ?1. Secondary erythrocytosis   ? ?#Secondary polycythemia likely due to OSA. Labs reviewed and discussed with patient. Plan for phlebotomy if hematocrit > 52 with add back equivalent fluids/NaCl. Hematocrit today is 52.2. Proceed with phlebotomy 500 cc once with fluids. Continue use of cpap.  ? ?Disposition: ?3 mo- lab (cbc, cmp), +/- phlebotomy with fluids ?6 mo- lab (cbc, cmp), Dr Tasia Catchings, +/- phlebotomy with fluids- la ? ?We spent sufficient time to discuss many aspect of care, questions were answered to patient's  satisfaction. ? ?Beckey Rutter, DNP, AGNP-C ?[place] [facility] ?[phone] (clinic) ?05/04/2022 ? ? ? ? ? ? ?    ? ? ? ? ? ?

## 2022-05-04 NOTE — Progress Notes (Signed)
VSS, discharged to home. ?

## 2022-05-04 NOTE — Patient Instructions (Signed)

## 2022-08-03 ENCOUNTER — Other Ambulatory Visit: Payer: Self-pay

## 2022-08-03 ENCOUNTER — Inpatient Hospital Stay: Payer: Medicare Other

## 2022-08-03 ENCOUNTER — Other Ambulatory Visit: Payer: Medicare Other

## 2022-08-03 ENCOUNTER — Inpatient Hospital Stay: Payer: Medicare Other | Attending: Oncology

## 2022-08-03 ENCOUNTER — Ambulatory Visit: Payer: Medicare Other | Admitting: Oncology

## 2022-08-03 DIAGNOSIS — D751 Secondary polycythemia: Secondary | ICD-10-CM | POA: Diagnosis present

## 2022-08-03 LAB — COMPREHENSIVE METABOLIC PANEL
ALT: 18 U/L (ref 0–44)
AST: 23 U/L (ref 15–41)
Albumin: 3.7 g/dL (ref 3.5–5.0)
Alkaline Phosphatase: 50 U/L (ref 38–126)
Anion gap: 12 (ref 5–15)
BUN: 16 mg/dL (ref 8–23)
CO2: 20 mmol/L — ABNORMAL LOW (ref 22–32)
Calcium: 9 mg/dL (ref 8.9–10.3)
Chloride: 101 mmol/L (ref 98–111)
Creatinine, Ser: 0.88 mg/dL (ref 0.61–1.24)
GFR, Estimated: >60 mL/min (ref >60)
Glucose, Bld: 204 mg/dL — ABNORMAL HIGH (ref 70–99)
Potassium: 3.9 mmol/L (ref 3.5–5.1)
Sodium: 133 mmol/L — ABNORMAL LOW (ref 135–145)
Total Bilirubin: 0.6 mg/dL (ref 0.3–1.2)
Total Protein: 7.2 g/dL (ref 6.5–8.1)

## 2022-08-03 LAB — CBC WITH DIFFERENTIAL/PLATELET
Abs Immature Granulocytes: 0.03 10*3/uL (ref 0.00–0.07)
Basophils Absolute: 0 10*3/uL (ref 0.0–0.1)
Basophils Relative: 0 %
Eosinophils Absolute: 0.3 10*3/uL (ref 0.0–0.5)
Eosinophils Relative: 3 %
HCT: 50.8 % (ref 39.0–52.0)
Hemoglobin: 17.3 g/dL — ABNORMAL HIGH (ref 13.0–17.0)
Immature Granulocytes: 0 %
Lymphocytes Relative: 27 %
Lymphs Abs: 2.7 10*3/uL (ref 0.7–4.0)
MCH: 28.1 pg (ref 26.0–34.0)
MCHC: 34.1 g/dL (ref 30.0–36.0)
MCV: 82.6 fL (ref 80.0–100.0)
Monocytes Absolute: 0.8 10*3/uL (ref 0.1–1.0)
Monocytes Relative: 8 %
Neutro Abs: 5.9 10*3/uL (ref 1.7–7.7)
Neutrophils Relative %: 62 %
Platelets: 267 10*3/uL (ref 150–400)
RBC: 6.15 MIL/uL — ABNORMAL HIGH (ref 4.22–5.81)
RDW: 14.4 % (ref 11.5–15.5)
WBC: 9.8 10*3/uL (ref 4.0–10.5)
nRBC: 0 % (ref 0.0–0.2)

## 2022-08-03 NOTE — Progress Notes (Signed)
Hct 50.8. patient does not require a phlebotomy today. He is asymptomatic. Patient provided with avs with next apt and discharged.

## 2022-08-04 ENCOUNTER — Other Ambulatory Visit: Payer: Medicare Other

## 2022-10-25 ENCOUNTER — Other Ambulatory Visit: Payer: Self-pay

## 2022-10-25 DIAGNOSIS — D751 Secondary polycythemia: Secondary | ICD-10-CM

## 2022-10-26 ENCOUNTER — Inpatient Hospital Stay: Payer: Medicare Other

## 2022-10-26 ENCOUNTER — Inpatient Hospital Stay (HOSPITAL_BASED_OUTPATIENT_CLINIC_OR_DEPARTMENT_OTHER): Payer: Medicare Other | Admitting: Oncology

## 2022-10-26 ENCOUNTER — Encounter: Payer: Self-pay | Admitting: Oncology

## 2022-10-26 ENCOUNTER — Inpatient Hospital Stay: Payer: Medicare Other | Attending: Oncology

## 2022-10-26 VITALS — BP 122/75 | HR 95

## 2022-10-26 DIAGNOSIS — D751 Secondary polycythemia: Secondary | ICD-10-CM

## 2022-10-26 DIAGNOSIS — E119 Type 2 diabetes mellitus without complications: Secondary | ICD-10-CM | POA: Diagnosis not present

## 2022-10-26 DIAGNOSIS — G4733 Obstructive sleep apnea (adult) (pediatric): Secondary | ICD-10-CM | POA: Insufficient documentation

## 2022-10-26 LAB — COMPREHENSIVE METABOLIC PANEL
ALT: 15 U/L (ref 0–44)
AST: 19 U/L (ref 15–41)
Albumin: 4 g/dL (ref 3.5–5.0)
Alkaline Phosphatase: 60 U/L (ref 38–126)
Anion gap: 9 (ref 5–15)
BUN: 18 mg/dL (ref 8–23)
CO2: 24 mmol/L (ref 22–32)
Calcium: 9.3 mg/dL (ref 8.9–10.3)
Chloride: 102 mmol/L (ref 98–111)
Creatinine, Ser: 0.95 mg/dL (ref 0.61–1.24)
GFR, Estimated: >60 mL/min (ref >60)
Glucose, Bld: 185 mg/dL — ABNORMAL HIGH (ref 70–99)
Potassium: 4.4 mmol/L (ref 3.5–5.1)
Sodium: 135 mmol/L (ref 135–145)
Total Bilirubin: 0.9 mg/dL (ref 0.3–1.2)
Total Protein: 7.6 g/dL (ref 6.5–8.1)

## 2022-10-26 LAB — CBC WITH DIFFERENTIAL/PLATELET
Abs Immature Granulocytes: 0.04 10*3/uL (ref 0.00–0.07)
Basophils Absolute: 0.1 10*3/uL (ref 0.0–0.1)
Basophils Relative: 1 %
Eosinophils Absolute: 0.3 10*3/uL (ref 0.0–0.5)
Eosinophils Relative: 3 %
HCT: 53.3 % — ABNORMAL HIGH (ref 39.0–52.0)
Hemoglobin: 18.2 g/dL — ABNORMAL HIGH (ref 13.0–17.0)
Immature Granulocytes: 0 %
Lymphocytes Relative: 26 %
Lymphs Abs: 2.4 10*3/uL (ref 0.7–4.0)
MCH: 28.8 pg (ref 26.0–34.0)
MCHC: 34.1 g/dL (ref 30.0–36.0)
MCV: 84.2 fL (ref 80.0–100.0)
Monocytes Absolute: 0.7 10*3/uL (ref 0.1–1.0)
Monocytes Relative: 7 %
Neutro Abs: 5.8 10*3/uL (ref 1.7–7.7)
Neutrophils Relative %: 63 %
Platelets: 242 10*3/uL (ref 150–400)
RBC: 6.33 MIL/uL — ABNORMAL HIGH (ref 4.22–5.81)
RDW: 14.1 % (ref 11.5–15.5)
WBC: 9.2 10*3/uL (ref 4.0–10.5)
nRBC: 0 % (ref 0.0–0.2)

## 2022-10-26 MED ORDER — SODIUM CHLORIDE 0.9 % IV SOLN
Freq: Once | INTRAVENOUS | Status: AC
  Filled 2022-10-26: qty 250

## 2022-10-26 NOTE — Patient Instructions (Signed)

## 2022-10-26 NOTE — Progress Notes (Signed)
Hematology/Oncology Progress note [phone] [phone]     Patient Care Team: [place] Pink, MD as PCP - General (Family Medicine)   ASSESSMENT & PLAN:   Erythrocytosis #Secondary polycythemia likely due to OSA. Labs reviewed and discussed with patient. Hematocrit is above 52 today.  Proceed with phlebotomy 500 cc x 1 today. Continue use of CPAP machine   Orders Placed This Encounter  Procedures   CBC with Differential/Platelet    Standing Status:   Future    Standing Expiration Date:   10/26/2023   Follow in 6 months.  All questions were answered. The patient knows to call the clinic with any problems, questions or concerns.  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  BCR able and Jak 2 mutation testing was negative in 2011.  He does have a history of obstructive sleep apnea and uses CPAP.  He used to follow up with [name].  He was initially getting phlebotomy every 2 weeks but now gets it every 2 months if need be.  His threshold for phlebotomy was  a hematocrit of greater than 47.  patient previously follows up with [name]. He switches care to me on 01/15/2019.  06/15/2016 JAK2 V617F negative, Exons 12-15 mutation negative.  06/15/2017 Erythropoietin 12.8.  05/15/2019 further testing including MPL, carl mutation which both are negative  On Phlebotomy PRN if Hct >50 now.   Interval history-  71 y.o. male with a history of erythrocytosis present to follow-up.  Patient has no new complaints today. Patient uses his CPAP machine every day. Denies any shortness of breath or chest pain  Review of systems- Review of Systems  Constitutional:  Negative for chills, fever, malaise/fatigue and weight  loss.  HENT:  Negative for sore throat.   Eyes:  Negative for redness.  Respiratory:  Negative for cough, shortness of breath and wheezing.   Cardiovascular:  Negative for chest pain, palpitations and leg swelling.  Gastrointestinal:  Negative for abdominal pain, blood in stool, nausea and vomiting.  Genitourinary:  Negative for dysuria.  Musculoskeletal:  Negative for myalgias.  Skin:  Negative for rash.  Neurological:  Negative for dizziness, tingling and tremors.  Endo/Heme/Allergies:  Does not bruise/bleed easily.  Psychiatric/Behavioral:  Negative for hallucinations.        Allergies  Allergen Reactions   Sertraline Hcl Other (See Comments)    Vivid dreams     Past Medical History:  Diagnosis Date   Anxiety    Arthritis    Diabetes mellitus without complication ([place])    Erythrocytosis 05/18/2015   Erythrocytosis    History of kidney stones    Hydronephrosis with ureteropelvic junction (UPJ) obstruction 12/06/2017   Per [facility] Flow w Pharm order   Polycythemia    Sleep apnea    USES CPAP     Past Surgical History:  Procedure Laterality Date   BLADDER STONE REMOVAL     cataracts Bilateral    COLONOSCOPY N/A 06/02/2015   Procedure: COLONOSCOPY;  Surgeon: [name], MD;  Location: [facility] ENDOSCOPY;  Service: Endoscopy;  Laterality: N/A;   CYSTOSCOPY W/ RETROGRADES Left 11/01/2017   Procedure: CYSTOSCOPY WITH RETROGRADE PYELOGRAM;  Surgeon: [name], MD;  Location: [facility] ORS;  Service: Urology;  Laterality: Left;  CYSTOSCOPY WITH STENT PLACEMENT Left 11/01/2017   Procedure: CYSTOSCOPY WITH STENT PLACEMENT;  Surgeon: [name], MD;  Location: [facility] ORS;  Service: Urology;  Laterality: Left;    Social History   Socioeconomic History   Marital status: Married    Spouse name: Not on file   Number of children: Not on file   Years of education: Not on file   Highest education level: Not on file  Occupational History   Not on file   Tobacco Use   Smoking status: Never   Smokeless tobacco: Never  Vaping Use   Vaping Use: Never used  Substance and Sexual Activity   Alcohol use: No   Drug use: No   Sexual activity: Not on file  Other Topics Concern   Not on file  Social History Narrative   Not on file   Social Determinants of Health   Financial Resource Strain: Not on file  Food Insecurity: Not on file  Transportation Needs: Not on file  Physical Activity: Not on file  Stress: Not on file  Social Connections: Not on file  Intimate Partner Violence: Not on file    Family History  Problem Relation Age of Onset   Heart disease Mother    Diabetes Father      Current Outpatient Medications:    glipiZIDE (GLUCOTROL XL) 10 MG 24 hr tablet, Take 1 tablet by mouth 2 (two) times daily., Disp: , Rfl:    HYDROcodone-acetaminophen (NORCO/VICODIN) 5-325 MG tablet, Take 1 tablet by mouth 2 (two) times daily as needed for moderate pain. Reported on 06/15/2016 (Patient taking differently: Take 1 tablet by mouth every 6 (six) hours as needed for moderate pain.), Disp: 30 tablet, Rfl: 0   insulin NPH Human (NOVOLIN N) 100 UNIT/ML injection, Inject 25 Units into the skin at bedtime. 25 units every evening, Disp: , Rfl:    lisinopril (PRINIVIL,ZESTRIL) 2.5 MG tablet, Take 2.5 mg by mouth at bedtime., Disp: , Rfl:    LORazepam (ATIVAN) 1 MG tablet, Take 1 tablet (1 mg total) by mouth daily. (Patient taking differently: Take 1 mg by mouth 2 (two) times daily.), Disp: 15 tablet, Rfl: 0   metFORMIN (GLUCOPHAGE-XR) 500 MG 24 hr tablet, Take 1,000 mg by mouth 2 (two) times daily., Disp: , Rfl:    pioglitazone (ACTOS) 15 MG tablet, Take 15 mg by mouth daily., Disp: , Rfl:    pravastatin (PRAVACHOL) 20 MG tablet, Take 20 mg by mouth daily., Disp: , Rfl:    Semaglutide,0.25 or 0.'5MG'$ /DOS, 2 MG/1.5ML SOPN, Inject 0.25 mg into the skin every 7 (seven) days. , Disp: , Rfl:    tadalafil (CIALIS) 5 MG tablet, TAKE ONE TABLET BY MOUTH DAILY,  Disp: 90 tablet, Rfl: 0   pravastatin (PRAVACHOL) 10 MG tablet, Take 10 mg by mouth at bedtime. , Disp: , Rfl:   Current Facility-Administered Medications:    lidocaine (XYLOCAINE) 2 % jelly 1 application, 1 application , Urethral, Once, Stoioff, Scott C, MD  Physical exam: ECOG 1 Vitals:   10/26/22 0955  BP: (!) 142/77  Pulse: (!) 107  Resp: 18  Temp: 99.1 F (37.3 C)  TempSrc: Tympanic  SpO2: 96%  Weight: 243 lb (110.2 kg)   Physical Exam Constitutional:      General: He is not in acute distress.    Appearance: Normal appearance. He is obese.  HENT:     Head: Normocephalic and atraumatic.  Eyes:     General: No scleral icterus.  Pupils: Pupils are equal, round, and reactive to light.  Cardiovascular:     Rate and Rhythm: Normal rate and regular rhythm.     Heart sounds: Normal heart sounds.  Pulmonary:     Effort: Pulmonary effort is normal. No respiratory distress.     Breath sounds: Normal breath sounds. No wheezing.  Abdominal:     General: Bowel sounds are normal. There is no distension.     Palpations: Abdomen is soft. There is no mass.     Tenderness: There is no abdominal tenderness.  Musculoskeletal:        General: No deformity. Normal range of motion.     Cervical back: Normal range of motion and neck supple.  Skin:    General: Skin is warm and dry.     Findings: No erythema or rash.  Neurological:     Mental Status: He is alert and oriented to person, place, and time. Mental status is at baseline.     Cranial Nerves: No cranial nerve deficit.     Coordination: Coordination normal.  Psychiatric:        Mood and Affect: Mood normal.         Latest Ref Rng & Units 10/26/2022    9:38 AM  CMP  Glucose 70 - 99 mg/dL 185   BUN 8 - 23 mg/dL 18   Creatinine 0.61 - 1.24 mg/dL 0.95   Sodium 135 - 145 mmol/L 135   Potassium 3.5 - 5.1 mmol/L 4.4   Chloride 98 - 111 mmol/L 102   CO2 22 - 32 mmol/L 24   Calcium 8.9 - 10.3 mg/dL 9.3   Total Protein 6.5 -  8.1 g/dL 7.6   Total Bilirubin 0.3 - 1.2 mg/dL 0.9   Alkaline Phos 38 - 126 U/L 60   AST 15 - 41 U/L 19   ALT 0 - 44 U/L 15       Latest Ref Rng & Units 10/26/2022    9:38 AM  CBC  WBC 4.0 - 10.5 K/uL 9.2   Hemoglobin 13.0 - 17.0 g/dL 18.2   Hematocrit 39.0 - 52.0 % 53.3   Platelets 150 - 400 K/uL 242    RADIOGRAPHIC STUDIES: I have personally reviewed the radiological images as listed and agreed with the findings in the report. No results found.

## 2022-10-26 NOTE — Progress Notes (Signed)
HCT 53.3. Removed 500 ml of blood from R AC per phlebotomy parameters. Fluid replacement with 500 ml of NS given. Tolerated procedure well. Accepted a beverage. VSS. Pt feeling well at time of discharge.

## 2022-10-26 NOTE — Assessment & Plan Note (Addendum)
#  Secondary polycythemia likely due to OSA. Labs reviewed and discussed with patient. Hematocrit is above 52 today.  Proceed with phlebotomy 500 cc x 1 today. Continue use of CPAP machine

## 2022-11-04 ENCOUNTER — Other Ambulatory Visit: Payer: Medicare Other

## 2022-11-04 ENCOUNTER — Ambulatory Visit: Payer: Medicare Other | Admitting: Oncology

## 2023-03-27 ENCOUNTER — Encounter: Payer: Self-pay | Admitting: Oncology

## 2023-04-26 ENCOUNTER — Inpatient Hospital Stay (HOSPITAL_BASED_OUTPATIENT_CLINIC_OR_DEPARTMENT_OTHER): Payer: Medicare Other | Admitting: Oncology

## 2023-04-26 ENCOUNTER — Encounter: Payer: Self-pay | Admitting: Oncology

## 2023-04-26 ENCOUNTER — Inpatient Hospital Stay: Payer: Medicare Other | Attending: Oncology

## 2023-04-26 ENCOUNTER — Inpatient Hospital Stay: Payer: Medicare Other

## 2023-04-26 VITALS — BP 117/79 | HR 67

## 2023-04-26 VITALS — BP 136/86 | HR 95 | Temp 98.9°F | Resp 18 | Wt 250.1 lb

## 2023-04-26 DIAGNOSIS — G4733 Obstructive sleep apnea (adult) (pediatric): Secondary | ICD-10-CM | POA: Diagnosis not present

## 2023-04-26 DIAGNOSIS — D751 Secondary polycythemia: Secondary | ICD-10-CM

## 2023-04-26 LAB — CBC WITH DIFFERENTIAL/PLATELET
Abs Immature Granulocytes: 0.05 10*3/uL (ref 0.00–0.07)
Basophils Absolute: 0.1 10*3/uL (ref 0.0–0.1)
Basophils Relative: 1 %
Eosinophils Absolute: 0.3 10*3/uL (ref 0.0–0.5)
Eosinophils Relative: 3 %
HCT: 52.3 % — ABNORMAL HIGH (ref 39.0–52.0)
Hemoglobin: 17.9 g/dL — ABNORMAL HIGH (ref 13.0–17.0)
Immature Granulocytes: 1 %
Lymphocytes Relative: 31 %
Lymphs Abs: 3.2 10*3/uL (ref 0.7–4.0)
MCH: 29.3 pg (ref 26.0–34.0)
MCHC: 34.2 g/dL (ref 30.0–36.0)
MCV: 85.7 fL (ref 80.0–100.0)
Monocytes Absolute: 0.9 10*3/uL (ref 0.1–1.0)
Monocytes Relative: 9 %
Neutro Abs: 5.6 10*3/uL (ref 1.7–7.7)
Neutrophils Relative %: 55 %
Platelets: 240 10*3/uL (ref 150–400)
RBC: 6.1 MIL/uL — ABNORMAL HIGH (ref 4.22–5.81)
RDW: 13.2 % (ref 11.5–15.5)
WBC: 10.1 10*3/uL (ref 4.0–10.5)
nRBC: 0 % (ref 0.0–0.2)

## 2023-04-26 MED ORDER — SODIUM CHLORIDE 0.9 % IV SOLN
Freq: Once | INTRAVENOUS | Status: AC
  Filled 2023-04-26: qty 250

## 2023-04-26 NOTE — Assessment & Plan Note (Signed)
#  Secondary polycythemia likely due to OSA. Labs reviewed and discussed with patient. Lab Results  Component Value Date   HGB 18.2 (H) 10/26/2022   HCT 53.3 (H) 10/26/2022    Hematocrit is above 52 today.  Proceed with phlebotomy 500 cc x 1 today. Continue use of CPAP machine

## 2023-04-26 NOTE — Progress Notes (Signed)
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 Labs reviewed and discussed with patient. Lab Results  Component Value Date   HGB 18.2 (H) 10/26/2022   HCT 53.3 (H) 10/26/2022    Hematocrit is above 52 today.  Proceed with phlebotomy 500 cc x 1 today. Continue use of CPAP machine   Orders Placed This Encounter  Procedures   CBC with Differential (Cancer Center Only)    Standing Status:   Future    Standing Expiration Date:   04/25/2024   Follow in 6 months.  All questions were answered. The patient knows to call the clinic with any problems, questions or concerns.  Benjamin Patience, MD, PhD [facility] Hematology Oncology 04/26/2023    Diagnosis- secondary polycythemia likely due to OSA  Chief complaint/ Reason for visit- evaluate need for phlebotomy  Heme/Onc history: Patient is a 72 year old male with a history of secondary polycythemia since 2005.  BCR able and Jak 2 mutation testing was negative in 2011.  He does have a history of obstructive sleep apnea and uses CPAP.  He used to follow up with Dr. Merlene Pulling.  He was initially getting phlebotomy every 2 weeks but now gets it every 2 months if need be.  His threshold for phlebotomy was  a hematocrit of greater than 47.  patient previously follows up with Dr. Merlene Pulling. He switches care to me on 01/15/2019.  06/15/2016 JAK2 V617F negative, Exons 12-15 mutation negative.  06/15/2017 Erythropoietin 12.8.  05/15/2019 further testing including MPL, carl mutation which both are negative  On Phlebotomy PRN if Hct >50 now.   Interval history-  72 y.o. male with a history of erythrocytosis present to follow-up.  Patient has no new complaints today. Patient uses his CPAP machine every day. Recently he injured his left lower extremity after a fall.   Denies any shortness of breath or chest pain  Review of systems- Review of Systems  Constitutional:  Negative for chills, fever, malaise/fatigue and weight loss.  HENT:  Negative for sore throat.   Eyes:  Negative for redness.  Respiratory:  Negative for cough, shortness of breath and wheezing.   Cardiovascular:  Negative for chest pain, palpitations and leg swelling.  Gastrointestinal:  Negative for abdominal pain, blood in stool, nausea and vomiting.  Genitourinary:  Negative for dysuria.  Musculoskeletal:  Negative for myalgias.  Skin:  Negative for rash.  Neurological:  Negative for dizziness, tingling and tremors.  Endo/Heme/Allergies:  Does not bruise/bleed easily.  Psychiatric/Behavioral:  Negative for hallucinations.        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   Per [facility] Flow w Pharm order   Polycythemia    Sleep apnea    USES CPAP     Past Surgical History:  Procedure Laterality Date   BLADDER STONE REMOVAL     cataracts Bilateral    COLONOSCOPY N/A 06/02/2015   Procedure: COLONOSCOPY;  Surgeon: Elnita Maxwell, MD;  Location: [facility] ENDOSCOPY;  Service:  Endoscopy;  Laterality: N/A;   CYSTOSCOPY W/ RETROGRADES Left 11/01/2017   Procedure: CYSTOSCOPY WITH RETROGRADE PYELOGRAM;  Surgeon: Riki Altes, MD;  Location: [facility] ORS;  Service: Urology;  Laterality: Left;   CYSTOSCOPY WITH STENT PLACEMENT Left 11/01/2017   Procedure: CYSTOSCOPY WITH STENT PLACEMENT;  Surgeon: Riki Altes, MD;  Location: [facility] ORS;  Service: Urology;  Laterality: Left;    Social History   Socioeconomic History   Marital status: Married    Spouse name: Not  on file   Number of children: Not on file   Years of education: Not on file   Highest education level: Not on file  Occupational History   Not on file  Tobacco Use   Smoking status: Never   Smokeless tobacco: Never  Vaping Use   Vaping Use: Never used  Substance and Sexual Activity   Alcohol use: No   Drug use: No   Sexual activity: Not on file  Other Topics Concern   Not on file  Social History Narrative   Not on file   Social Determinants of Health   Financial Resource Strain: Not on file  Food Insecurity: Not on file  Transportation Needs: Not on file  Physical Activity: Not on file  Stress: Not on file  Social Connections: Not on file  Intimate Partner Violence: Not on file    Family History  Problem Relation Age of Onset   Heart disease Mother    Diabetes Father      Current Outpatient Medications:    glipiZIDE (GLUCOTROL XL) 10 MG 24 hr tablet, Take 1 tablet by mouth 2 (two) times daily., Disp: , Rfl:    HYDROcodone-acetaminophen (NORCO/VICODIN) 5-325 MG tablet, Take 1 tablet by mouth 2 (two) times daily as needed for moderate pain. Reported on 06/15/2016 (Patient taking differently: Take 1 tablet by mouth every 6 (six) hours as needed for moderate pain.), Disp: 30 tablet, Rfl: 0   insulin NPH Human (NOVOLIN N) 100 UNIT/ML injection, Inject 25 Units into the skin at bedtime. 25 units every evening, Disp: , Rfl:    lisinopril (PRINIVIL,ZESTRIL) 2.5 MG tablet, Take 2.5 mg by mouth at bedtime., Disp: , Rfl:    LORazepam (ATIVAN) 1 MG tablet, Take 1 tablet (1 mg total) by mouth daily. (Patient taking differently: Take 1 mg by mouth 2 (two) times daily.), Disp: 15 tablet, Rfl: 0   metFORMIN (GLUCOPHAGE-XR) 500 MG 24 hr tablet, Take 1,000 mg by mouth 2 (two) times daily., Disp: , Rfl:    pioglitazone (ACTOS) 15 MG tablet, Take 15 mg by mouth daily., Disp: , Rfl:    pravastatin (PRAVACHOL) 10 MG tablet, Take 10 mg by mouth at bedtime. , Disp: , Rfl:    tadalafil (CIALIS)  5 MG tablet, TAKE ONE TABLET BY MOUTH DAILY, Disp: 90 tablet, Rfl: 0   Semaglutide,0.25 or 0.5MG /DOS, 2 MG/1.5ML SOPN, Inject 0.25 mg into the skin every 7 (seven) days. , Disp: , Rfl:   Current Facility-Administered Medications:    lidocaine (XYLOCAINE) 2 % jelly 1 application, 1 application , Urethral, Once, Stoioff, Scott C, MD  Physical exam: ECOG 1 Vitals:   04/26/23 1330  BP: 136/86  Pulse: 95  Resp: 18  Temp: 98.9 F (37.2 C)  Weight: 250 lb 1.6 oz (113.4 kg)   Physical Exam Constitutional:      General: He is not in acute distress.    Appearance: He is obese.  HENT:     Head: Normocephalic and atraumatic.  Eyes:  General: No scleral icterus. Cardiovascular:     Rate and Rhythm: Normal rate.  Pulmonary:     Effort: Pulmonary effort is normal. No respiratory distress.     Breath sounds: No wheezing.  Abdominal:     General: There is no distension.  Musculoskeletal:        General: No deformity. Normal range of motion.     Cervical back: Normal range of motion and neck supple.  Skin:    General: Skin is warm.     Findings: No rash.  Neurological:     Mental Status: He is alert and oriented to person, place, and time. Mental status is at baseline.     Cranial Nerves: No cranial nerve deficit.  Psychiatric:        Mood and Affect: Mood normal.         Latest Ref Rng & Units 10/26/2022    9:38 AM  CMP  Glucose 70 - 99 mg/dL 010   BUN 8 - 23 mg/dL 18   Creatinine 2.72 - 1.24 mg/dL 5.36   Sodium 644 - 034 mmol/L 135   Potassium 3.5 - 5.1 mmol/L 4.4   Chloride 98 - 111 mmol/L 102   CO2 22 - 32 mmol/L 24   Calcium 8.9 - 10.3 mg/dL 9.3   Total Protein 6.5 - 8.1 g/dL 7.6   Total Bilirubin 0.3 - 1.2 mg/dL 0.9   Alkaline Phos 38 - 126 U/L 60   AST 15 - 41 U/L 19   ALT 0 - 44 U/L 15       Latest Ref Rng & Units 04/26/2023    1:10 PM  CBC  WBC 4.0 - 10.5 K/uL 10.1   Hemoglobin 13.0 - 17.0 g/dL 74.2   Hematocrit 59.5 - 52.0 % 52.3   Platelets 150 - 400  K/uL 240    RADIOGRAPHIC STUDIES: I have personally reviewed the radiological images as listed and agreed with the findings in the report. No results found.

## 2023-04-26 NOTE — Progress Notes (Signed)
Pt here for follow up. No new concerns voiced.   

## 2023-10-24 ENCOUNTER — Inpatient Hospital Stay: Payer: Medicare Other | Attending: Oncology

## 2023-10-24 ENCOUNTER — Inpatient Hospital Stay: Payer: Medicare Other

## 2023-10-24 ENCOUNTER — Inpatient Hospital Stay (HOSPITAL_BASED_OUTPATIENT_CLINIC_OR_DEPARTMENT_OTHER): Payer: Medicare Other | Admitting: Oncology

## 2023-10-24 ENCOUNTER — Encounter: Payer: Self-pay | Admitting: Oncology

## 2023-10-24 VITALS — BP 140/74 | HR 77

## 2023-10-24 VITALS — BP 145/76 | HR 55 | Temp 97.1°F | Resp 18 | Wt 256.3 lb

## 2023-10-24 DIAGNOSIS — D751 Secondary polycythemia: Secondary | ICD-10-CM

## 2023-10-24 DIAGNOSIS — Z87442 Personal history of urinary calculi: Secondary | ICD-10-CM | POA: Insufficient documentation

## 2023-10-24 DIAGNOSIS — Z7984 Long term (current) use of oral hypoglycemic drugs: Secondary | ICD-10-CM | POA: Diagnosis not present

## 2023-10-24 DIAGNOSIS — G4733 Obstructive sleep apnea (adult) (pediatric): Secondary | ICD-10-CM | POA: Diagnosis not present

## 2023-10-24 DIAGNOSIS — E1136 Type 2 diabetes mellitus with diabetic cataract: Secondary | ICD-10-CM | POA: Diagnosis not present

## 2023-10-24 DIAGNOSIS — Z79899 Other long term (current) drug therapy: Secondary | ICD-10-CM | POA: Diagnosis not present

## 2023-10-24 DIAGNOSIS — Z794 Long term (current) use of insulin: Secondary | ICD-10-CM | POA: Diagnosis not present

## 2023-10-24 DIAGNOSIS — Z7985 Long-term (current) use of injectable non-insulin antidiabetic drugs: Secondary | ICD-10-CM | POA: Insufficient documentation

## 2023-10-24 LAB — CBC WITH DIFFERENTIAL (CANCER CENTER ONLY)
Abs Immature Granulocytes: 0.05 10*3/uL (ref 0.00–0.07)
Basophils Absolute: 0 10*3/uL (ref 0.0–0.1)
Basophils Relative: 0 %
Eosinophils Absolute: 0.2 10*3/uL (ref 0.0–0.5)
Eosinophils Relative: 2 %
HCT: 52.2 % — ABNORMAL HIGH (ref 39.0–52.0)
Hemoglobin: 18 g/dL — ABNORMAL HIGH (ref 13.0–17.0)
Immature Granulocytes: 1 %
Lymphocytes Relative: 29 %
Lymphs Abs: 2.8 10*3/uL (ref 0.7–4.0)
MCH: 29.8 pg (ref 26.0–34.0)
MCHC: 34.5 g/dL (ref 30.0–36.0)
MCV: 86.4 fL (ref 80.0–100.0)
Monocytes Absolute: 0.8 10*3/uL (ref 0.1–1.0)
Monocytes Relative: 8 %
Neutro Abs: 5.8 10*3/uL (ref 1.7–7.7)
Neutrophils Relative %: 60 %
Platelet Count: 209 10*3/uL (ref 150–400)
RBC: 6.04 MIL/uL — ABNORMAL HIGH (ref 4.22–5.81)
RDW: 13.4 % (ref 11.5–15.5)
WBC Count: 9.7 10*3/uL (ref 4.0–10.5)
nRBC: 0 % (ref 0.0–0.2)

## 2023-10-24 NOTE — Assessment & Plan Note (Addendum)
#  Secondary polycythemia likely due to OSA. Labs reviewed and discussed with patient. Lab Results  Component Value Date   HGB 18.0 (H) 10/24/2023   HCT 52.2 (H) 10/24/2023    Hematocrit is above 52 today.  Proceed with phlebotomy 500 cc x 1 today. Continue use of CPAP machine -I recommend patient to follow-up with sleep medicine/pulmonology to ensure that night oxygen levels are adequate

## 2023-10-24 NOTE — Patient Instructions (Signed)

## 2023-10-24 NOTE — Progress Notes (Signed)
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 Labs reviewed and discussed with patient. Lab Results  Component Value Date   HGB 18.0 (H) 10/24/2023   HCT 52.2 (H) 10/24/2023    Hematocrit is above 52 today.  Proceed with phlebotomy 500 cc x 1 today. Continue use of CPAP machine -I recommend patient to follow-up with sleep medicine/pulmonology to ensure that night oxygen levels are adequate  Orders Placed This Encounter  Procedures   CBC with Differential (Cancer Center Only)    Standing Status:   Future    Standing Expiration Date:   10/23/2024   Follow in 6 months.  All questions were answered. The patient knows to call the clinic with any problems, questions or concerns.  Rickard Patience, MD, PhD [facility] Hematology Oncology 10/24/2023    Diagnosis- secondary polycythemia likely due to OSA  Chief complaint/ Reason for visit- evaluate need for phlebotomy  Heme/Onc history: Patient is a 72 year old male with a history of secondary polycythemia since 2005.  BCR able and Jak 2 mutation testing was negative in 2011.  He does have a history of obstructive sleep apnea and uses CPAP.  He used to follow up with Dr. Merlene Pulling.  He was initially getting phlebotomy every 2 weeks but now gets it every 2 months if need be.  His threshold for phlebotomy was  a hematocrit of greater than 47.  patient previously follows up with Dr. Merlene Pulling. He switches care to me on 01/15/2019.  06/15/2016 JAK2 V617F negative, Exons 12-15 mutation negative.  06/15/2017 Erythropoietin 12.8.  05/15/2019 further testing including MPL, carl mutation which both are negative  On Phlebotomy PRN if Hct >50 now.   Interval history-  72 y.o. male with a history of  erythrocytosis present to follow-up.  Patient has no new complaints today. Patient uses his CPAP machine every day. l.  Denies any shortness of breath or chest pain  Review of systems- Review of Systems  Constitutional:  Negative for chills, fever, malaise/fatigue and weight loss.  HENT:  Negative for sore throat.   Eyes:  Negative for redness.  Respiratory:  Negative for cough, shortness of breath and wheezing.   Cardiovascular:  Negative for chest pain, palpitations and leg swelling.  Gastrointestinal:  Negative for abdominal pain, blood in stool, nausea and vomiting.  Genitourinary:  Negative for dysuria.  Musculoskeletal:  Negative for myalgias.  Skin:  Negative for rash.  Neurological:  Negative for dizziness, tingling and tremors.  Endo/Heme/Allergies:  Does not bruise/bleed easily.  Psychiatric/Behavioral:  Negative for hallucinations.        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   Per [facility] Flow w Pharm order   Polycythemia    Sleep apnea    USES CPAP     Past Surgical History:  Procedure Laterality Date   BLADDER STONE REMOVAL     cataracts Bilateral    COLONOSCOPY N/A 06/02/2015   Procedure:  COLONOSCOPY;  Surgeon: Elnita Maxwell, MD;  Location[facility] ENDOSCOPY;  Service: Endoscopy;  Laterality: N/A;   CYSTOSCOPY W/ RETROGRADES Left 11/01/2017   Procedure: CYSTOSCOPY WITH RETROGRADE PYELOGRAM;  Surgeon: Riki Altes, MD;  Location: [facility] ORS;  Service: Urology;  Laterality: Left;   CYSTOSCOPY WITH STENT PLACEMENT Left 11/01/2017   Procedure: CYSTOSCOPY WITH STENT PLACEMENT;  Surgeon: Riki Altes, MD;  Location: [facility] ORS;  Service:  Urology;  Laterality: Left;    Social History   Socioeconomic History   Marital status: Married    Spouse name: Not on file   Number of children: Not on file   Years of education: Not on file   Highest education level: Not on file  Occupational History   Not on file  Tobacco Use   Smoking status: Never   Smokeless tobacco: Never  Vaping Use   Vaping status: Never Used  Substance and Sexual Activity   Alcohol use: No   Drug use: No   Sexual activity: Not on file  Other Topics Concern   Not on file  Social History Narrative   Not on file   Social Determinants of Health   Financial Resource Strain: Medium Risk (11/09/2022)   Received [facility], [facility]   Overall Financial Resource Strain (CARDIA)    Difficulty of Paying Living Expenses: Somewhat hard  Food Insecurity: No Food Insecurity (11/09/2022)   Received from [facility], [facility]   Hunger Vital Sign    Worried About Running Out of Food in the Last Year: Never true    Ran Out of Food in the Last Year: Never true  Transportation Needs: No Transportation Needs (11/09/2022)   Received from [facility], [facility]   [facility] - Transportation    In the past 12 months, has lack of transportation kept you from medical appointments or from getting medications?: No    Lack of Transportation (Non-Medical): No  Physical Activity: Not on file  Stress: Not on file  Social Connections: Not on file  Intimate Partner Violence: Not on file    Family History  Problem Relation Age of Onset   Heart disease Mother    Diabetes Father      Current Outpatient Medications:    glipiZIDE (GLUCOTROL XL) 10 MG 24 hr tablet, Take 1 tablet by mouth 2 (two) times daily., Disp: , Rfl:    HYDROcodone-acetaminophen (NORCO/VICODIN) 5-325 MG tablet, Take 1 tablet by mouth 2 (two) times daily as needed for moderate pain.  Reported on 06/15/2016 (Patient taking differently: Take 1 tablet by mouth every 6 (six) hours as needed for moderate pain (pain score 4-6).), Disp: 30 tablet, Rfl: 0   insulin NPH Human (NOVOLIN N) 100 UNIT/ML injection, Inject 25 Units into the skin at bedtime. 25 units every evening, Disp: , Rfl:    lisinopril (PRINIVIL,ZESTRIL) 2.5 MG tablet, Take 2.5 mg by mouth at bedtime., Disp: , Rfl:    LORazepam (ATIVAN) 1 MG tablet, Take 1 tablet (1 mg total) by mouth daily., Disp: 15 tablet, Rfl: 0   metFORMIN (GLUCOPHAGE-XR) 500 MG 24 hr tablet, Take 1,000 mg by mouth 2 (two) times daily., Disp: , Rfl:    pioglitazone (ACTOS) 15 MG tablet, Take 15 mg by mouth daily., Disp: , Rfl:    pravastatin (PRAVACHOL) 10 MG tablet, Take 10 mg by mouth at bedtime. , Disp: , Rfl:    Semaglutide,0.25 or 0.5MG /DOS, 2  MG/1.5ML SOPN, Inject 0.25 mg into the skin every 7 (seven) days. , Disp: , Rfl:    tadalafil (CIALIS) 5 MG tablet, TAKE ONE TABLET BY MOUTH DAILY, Disp: 90 tablet, Rfl: 0  Current Facility-Administered Medications:    lidocaine (XYLOCAINE) 2 % jelly 1 application, 1 application , Urethral, Once, Stoioff, Scott C, MD  Physical exam: ECOG 1 Vitals:   10/24/23 1409 10/24/23 1414  BP: (!) 160/82 (!) 145/76  Pulse: (!) 55   Resp: 18   Temp: (!) 97.1 F (36.2 C)   Weight: 256 lb 4.8 oz (116.3 kg)    Physical Exam Constitutional:      General: He is not in acute distress.    Appearance: He is obese.  HENT:     Head: Normocephalic and atraumatic.  Eyes:     General: No scleral icterus. Cardiovascular:     Rate and Rhythm: Normal rate.  Pulmonary:     Effort: Pulmonary effort is normal. No respiratory distress.     Breath sounds: No wheezing.  Abdominal:     General: There is no distension.  Musculoskeletal:        General: No deformity. Normal range of motion.     Cervical back: Normal range of motion and neck supple.  Skin:    General: Skin is warm.     Findings: No rash.  Neurological:      Mental Status: He is alert and oriented to person, place, and time. Mental status is at baseline.     Cranial Nerves: No cranial nerve deficit.  Psychiatric:        Mood and Affect: Mood normal.         Latest Ref Rng & Units 10/26/2022    9:38 AM  CMP  Glucose 70 - 99 mg/dL 161   BUN 8 - 23 mg/dL 18   Creatinine 0.96 - 1.24 mg/dL 0.45   Sodium 409 - 811 mmol/L 135   Potassium 3.5 - 5.1 mmol/L 4.4   Chloride 98 - 111 mmol/L 102   CO2 22 - 32 mmol/L 24   Calcium 8.9 - 10.3 mg/dL 9.3   Total Protein 6.5 - 8.1 g/dL 7.6   Total Bilirubin 0.3 - 1.2 mg/dL 0.9   Alkaline Phos 38 - 126 U/L 60   AST 15 - 41 U/L 19   ALT 0 - 44 U/L 15       Latest Ref Rng & Units 10/24/2023    1:58 PM  CBC  WBC 4.0 - 10.5 K/uL 9.7   Hemoglobin 13.0 - 17.0 g/dL 91.4   Hematocrit 78.2 - 52.0 % 52.2   Platelets 150 - 400 K/uL 209    RADIOGRAPHIC STUDIES: I have personally reviewed the radiological images as listed and agreed with the findings in the report. No results found.

## 2024-04-24 ENCOUNTER — Inpatient Hospital Stay: Payer: Medicare Other | Attending: Oncology

## 2024-04-24 ENCOUNTER — Encounter: Payer: Self-pay | Admitting: Oncology

## 2024-04-24 ENCOUNTER — Inpatient Hospital Stay: Payer: Medicare Other

## 2024-04-24 ENCOUNTER — Inpatient Hospital Stay (HOSPITAL_BASED_OUTPATIENT_CLINIC_OR_DEPARTMENT_OTHER): Payer: Medicare Other | Admitting: Oncology

## 2024-04-24 VITALS — BP 147/76 | HR 106 | Temp 97.8°F | Resp 18 | Wt 250.7 lb

## 2024-04-24 DIAGNOSIS — D751 Secondary polycythemia: Secondary | ICD-10-CM

## 2024-04-24 DIAGNOSIS — G4733 Obstructive sleep apnea (adult) (pediatric): Secondary | ICD-10-CM | POA: Insufficient documentation

## 2024-04-24 LAB — CBC WITH DIFFERENTIAL (CANCER CENTER ONLY)
Abs Immature Granulocytes: 0.06 10*3/uL (ref 0.00–0.07)
Basophils Absolute: 0.1 10*3/uL (ref 0.0–0.1)
Basophils Relative: 1 %
Eosinophils Absolute: 0.2 10*3/uL (ref 0.0–0.5)
Eosinophils Relative: 2 %
HCT: 51.7 % (ref 39.0–52.0)
Hemoglobin: 18 g/dL — ABNORMAL HIGH (ref 13.0–17.0)
Immature Granulocytes: 1 %
Lymphocytes Relative: 29 %
Lymphs Abs: 2.8 10*3/uL (ref 0.7–4.0)
MCH: 30.4 pg (ref 26.0–34.0)
MCHC: 34.8 g/dL (ref 30.0–36.0)
MCV: 87.3 fL (ref 80.0–100.0)
Monocytes Absolute: 0.8 10*3/uL (ref 0.1–1.0)
Monocytes Relative: 9 %
Neutro Abs: 5.7 10*3/uL (ref 1.7–7.7)
Neutrophils Relative %: 58 %
Platelet Count: 240 10*3/uL (ref 150–400)
RBC: 5.92 MIL/uL — ABNORMAL HIGH (ref 4.22–5.81)
RDW: 13.2 % (ref 11.5–15.5)
WBC Count: 9.7 10*3/uL (ref 4.0–10.5)
nRBC: 0 % (ref 0.0–0.2)

## 2024-04-24 NOTE — Progress Notes (Signed)
 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 Labs reviewed and discussed with patient. Lab Results  Component Value Date   HGB 18.0 (H) 04/24/2024   HCT 51.7 04/24/2024    Hematocrit is < 52 today.  hold off phlebotomy  Continue use of CPAP machine -I recommend patient to follow-up with sleep medicine/pulmonology to ensure that night oxygen levels are adequate  Orders Placed This Encounter  Procedures   CBC with Differential (Cancer Center Only)    Standing Status:   Future    Expected Date:   10/24/2024    Expiration Date:   04/24/2025   Follow in 6 months.  All questions were answered. The patient knows to call the clinic with any problems, questions or concerns.  Timmy Forbes, MD, PhD [facility] Hematology Oncology 04/24/2024    Diagnosis- secondary polycythemia likely due to OSA  Chief complaint/ Reason for visit- evaluate need for phlebotomy  Heme/Onc history: Patient is a 73 year old male with a history of secondary polycythemia since 2005.  BCR able and Jak 2 mutation testing was negative in 2011.  He does have a history of obstructive sleep apnea and uses CPAP.  He used to follow up with Dr. Beverely Buba.  He was initially getting phlebotomy every 2 weeks but now gets it every 2 months if need be.  His threshold for phlebotomy was  a hematocrit of greater than 47.  patient previously follows up with Dr. Beverely Buba. He switches care to me on 01/15/2019.  06/15/2016 JAK2 V617F negative, Exons 12-15 mutation negative.  06/15/2017 Erythropoietin  12.8.  05/15/2019 further testing including MPL, carl mutation which both are negative  On Phlebotomy PRN if Hct >52 now.   Interval history-  73 y.o. male with a history of erythrocytosis  present to follow-up.  Patient has no new complaints today. Patient uses his CPAP machine every day. l.  Denies any shortness of breath or chest pain  Review of systems- Review of Systems  Constitutional:  Negative for chills, fever, malaise/fatigue and weight loss.  HENT:  Negative for sore throat.   Eyes:  Negative for redness.  Respiratory:  Negative for cough, shortness of breath and wheezing.   Cardiovascular:  Negative for chest pain, palpitations and leg swelling.  Gastrointestinal:  Negative for abdominal pain, blood in stool, nausea and vomiting.  Genitourinary:  Negative for dysuria.  Musculoskeletal:  Negative for myalgias.  Skin:  Negative for rash.  Neurological:  Negative for dizziness, tingling and tremors.  Endo/Heme/Allergies:  Does not bruise/bleed easily.  Psychiatric/Behavioral:  Negative for hallucinations.        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   Per [facility] Flow w Pharm order   Polycythemia    Sleep apnea    USES CPAP     Past Surgical History:  Procedure Laterality Date   BLADDER STONE REMOVAL     cataracts Bilateral    COLONOSCOPY N/A 06/02/2015  Procedure: COLONOSCOPY;  Surgeon: Luella Sager, MD;  Location: [facility] ENDOSCOPY;  Service: Endoscopy;  Laterality: N/A;   CYSTOSCOPY W/ RETROGRADES Left 11/01/2017   Procedure: CYSTOSCOPY WITH RETROGRADE PYELOGRAM;  Surgeon: Geraline Knapp, MD;  Location: [facility] ORS;  Service: Urology;  Laterality: Left;   CYSTOSCOPY WITH STENT PLACEMENT Left 11/01/2017   Procedure: CYSTOSCOPY WITH STENT PLACEMENT;  Surgeon: Geraline Knapp, MD;  Location: [facility] ORS;  Service: Urology;   Laterality: Left;    Social History   Socioeconomic History   Marital status: Married    Spouse name: Not on file   Number of children: Not on file   Years of education: Not on file   Highest education level: Not on file  Occupational History   Not on file  Tobacco Use   Smoking status: Never   Smokeless tobacco: Never  Vaping Use   Vaping status: Never Used  Substance and Sexual Activity   Alcohol use: No   Drug use: No   Sexual activity: Not on file  Other Topics Concern   Not on file  Social History Narrative   Not on file   Social Drivers of Health   Financial Resource Strain: Medium Risk (11/09/2022)   Received from [facility]   Overall Financial Resource Strain (CARDIA)    Difficulty of Paying Living Expenses: Somewhat hard  Food Insecurity: No Food Insecurity (11/09/2022)   Received from [facility]   Hunger Vital Sign    Worried About Running Out of Food in the Last Year: Never true    Ran Out of Food in the Last Year: Never true  Transportation Needs: No Transportation Needs (11/09/2022)   Received from [facility] - Transportation    In the past 12 months, has lack of transportation kept you from medical appointments or from getting medications?: No    Lack of Transportation (Non-Medical): No  Physical Activity: Not on file  Stress: Not on file  Social Connections: Not on file  Intimate Partner Violence: Not on file    Family History  Problem Relation Age of Onset   Heart disease Mother    Diabetes Father      Current Outpatient Medications:    glipiZIDE (GLUCOTROL XL) 10 MG 24 hr tablet, Take 1 tablet by mouth 2 (two) times daily., Disp: , Rfl:    HYDROcodone -acetaminophen  (NORCO/VICODIN) 5-325 MG tablet, Take 1 tablet by mouth 2 (two) times daily as needed for moderate pain. Reported on 06/15/2016 (Patient taking differently: Take 1 tablet by mouth every 6 (six) hours as needed for  moderate pain (pain score 4-6).), Disp: 30 tablet, Rfl: 0   insulin  NPH Human (NOVOLIN N) 100 UNIT/ML injection, Inject 25 Units into the skin at bedtime. 25 units every evening, Disp: , Rfl:    lisinopril  (ZESTRIL ) 2.5 MG tablet, Take 1 tablet by mouth daily., Disp: , Rfl:    LORazepam  (ATIVAN ) 1 MG tablet, Take 1 tablet by mouth 2 (two) times daily as needed., Disp: , Rfl:    metFORMIN (GLUCOPHAGE-XR) 500 MG 24 hr tablet, Take 2 tablets by mouth 2 (two) times daily., Disp: , Rfl:    pioglitazone  (ACTOS ) 15 MG tablet, Take 15 mg by mouth daily., Disp: , Rfl:    pravastatin  (PRAVACHOL ) 10 MG tablet, Take 10 mg by mouth at bedtime. , Disp: , Rfl:    Semaglutide,0.25 or 0.5MG /DOS, 2 MG/1.5ML SOPN, Inject 0.25 mg into the skin every 7 (seven)  days. , Disp: , Rfl:    tadalafil  (CIALIS ) 5 MG tablet, TAKE ONE TABLET BY MOUTH DAILY, Disp: 90 tablet, Rfl: 0  Current Facility-Administered Medications:    lidocaine  (XYLOCAINE ) 2 % jelly 1 application, 1 application , Urethral, Once, Stoioff, Scott C, MD  Physical exam: ECOG 1 Vitals:   04/24/24 1406  BP: (!) 147/76  Pulse: (!) 106  Resp: 18  Temp: 97.8 F (36.6 C)  TempSrc: Tympanic  SpO2: 93%  Weight: 250 lb 11.2 oz (113.7 kg)   Physical Exam Constitutional:      General: He is not in acute distress.    Appearance: He is obese.  HENT:     Head: Normocephalic and atraumatic.  Eyes:     General: No scleral icterus. Cardiovascular:     Rate and Rhythm: Normal rate.  Pulmonary:     Effort: Pulmonary effort is normal. No respiratory distress.     Breath sounds: No wheezing.  Abdominal:     General: There is no distension.  Musculoskeletal:        General: No deformity. Normal range of motion.     Cervical back: Normal range of motion and neck supple.  Skin:    General: Skin is warm.     Findings: No rash.  Neurological:     Mental Status: He is alert and oriented to person, place, and time. Mental status is at baseline.     Cranial  Nerves: No cranial nerve deficit.  Psychiatric:        Mood and Affect: Mood normal.         Latest Ref Rng & Units 10/26/2022    9:38 AM  CMP  Glucose 70 - 99 mg/dL 409   BUN 8 - 23 mg/dL 18   Creatinine 8.11 - 1.24 mg/dL 9.14   Sodium 782 - 956 mmol/L 135   Potassium 3.5 - 5.1 mmol/L 4.4   Chloride 98 - 111 mmol/L 102   CO2 22 - 32 mmol/L 24   Calcium 8.9 - 10.3 mg/dL 9.3   Total Protein 6.5 - 8.1 g/dL 7.6   Total Bilirubin 0.3 - 1.2 mg/dL 0.9   Alkaline Phos 38 - 126 U/L 60   AST 15 - 41 U/L 19   ALT 0 - 44 U/L 15       Latest Ref Rng & Units 04/24/2024    2:01 PM  CBC  WBC 4.0 - 10.5 K/uL 9.7   Hemoglobin 13.0 - 17.0 g/dL 21.3   Hematocrit 08.6 - 52.0 % 51.7   Platelets 150 - 400 K/uL 240    RADIOGRAPHIC STUDIES: I have personally reviewed the radiological images as listed and agreed with the findings in the report. No results found.

## 2024-04-24 NOTE — Assessment & Plan Note (Addendum)
#  Secondary polycythemia likely due to OSA. Labs reviewed and discussed with patient. Lab Results  Component Value Date   HGB 18.0 (H) 04/24/2024   HCT 51.7 04/24/2024    Hematocrit is < 52 today.  hold off phlebotomy  Continue use of CPAP machine -I recommend patient to follow-up with sleep medicine/pulmonology to ensure that night oxygen levels are adequate

## 2024-10-23 ENCOUNTER — Encounter: Payer: Self-pay | Admitting: Oncology

## 2024-10-23 ENCOUNTER — Inpatient Hospital Stay: Admitting: Oncology

## 2024-10-23 ENCOUNTER — Inpatient Hospital Stay

## 2024-10-23 ENCOUNTER — Inpatient Hospital Stay: Attending: Oncology

## 2024-10-23 VITALS — BP 125/77 | HR 107 | Temp 97.2°F | Resp 18 | Wt 245.0 lb

## 2024-10-23 VITALS — BP 111/85 | HR 98 | Resp 18

## 2024-10-23 DIAGNOSIS — D751 Secondary polycythemia: Secondary | ICD-10-CM | POA: Insufficient documentation

## 2024-10-23 LAB — CBC WITH DIFFERENTIAL (CANCER CENTER ONLY)
Abs Immature Granulocytes: 0.06 K/uL (ref 0.00–0.07)
Basophils Absolute: 0.1 K/uL (ref 0.0–0.1)
Basophils Relative: 1 %
Eosinophils Absolute: 0.2 K/uL (ref 0.0–0.5)
Eosinophils Relative: 2 %
HCT: 54.4 % — ABNORMAL HIGH (ref 39.0–52.0)
Hemoglobin: 18.9 g/dL — ABNORMAL HIGH (ref 13.0–17.0)
Immature Granulocytes: 1 %
Lymphocytes Relative: 29 %
Lymphs Abs: 3 K/uL (ref 0.7–4.0)
MCH: 30.4 pg (ref 26.0–34.0)
MCHC: 34.7 g/dL (ref 30.0–36.0)
MCV: 87.5 fL (ref 80.0–100.0)
Monocytes Absolute: 0.9 K/uL (ref 0.1–1.0)
Monocytes Relative: 9 %
Neutro Abs: 6.1 K/uL (ref 1.7–7.7)
Neutrophils Relative %: 58 %
Platelet Count: 215 K/uL (ref 150–400)
RBC: 6.22 MIL/uL — ABNORMAL HIGH (ref 4.22–5.81)
RDW: 12.9 % (ref 11.5–15.5)
WBC Count: 10.4 K/uL (ref 4.0–10.5)
nRBC: 0 % (ref 0.0–0.2)

## 2024-10-23 MED ORDER — SODIUM CHLORIDE 0.9 % IV SOLN
Freq: Once | INTRAVENOUS | Status: AC
  Filled 2024-10-23: qty 250

## 2024-10-23 NOTE — Assessment & Plan Note (Addendum)
#  Secondary polycythemia likely due to OSA. Labs reviewed and discussed with patient. Lab Results  Component Value Date   HGB 18.9 (H) 10/23/2024   HCT 54.4 (H) 10/23/2024    Hematocrit is >52 today.  phlebotomy  Continue use of CPAP machine -I recommend patient to follow-up with sleep medicine/pulmonology to ensure that night oxygen levels are adequate

## 2024-10-23 NOTE — Patient Instructions (Signed)

## 2024-10-23 NOTE — Progress Notes (Signed)
 Benjamin [name] presents today for phlebotomy per MD orders. Phlebotomy procedure started at 1510 and ended at 1538. removed. Gave 500 ml NS bolus per patient request.  Patient tolerated procedure well. IV needle removed intact.

## 2024-10-23 NOTE — Progress Notes (Signed)
 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 Labs reviewed and discussed with patient. Lab Results  Component Value Date   HGB 18.9 (H) 10/23/2024   HCT 54.4 (H) 10/23/2024    Hematocrit is >52 today.  phlebotomy  Continue use of CPAP machine -I recommend patient to follow-up with sleep medicine/pulmonology to ensure that night oxygen levels are adequate  Orders Placed This Encounter  Procedures   CBC with Differential (Cancer Center Only)    Standing Status:   Future    Expected Date:   04/23/2025    Expiration Date:   07/22/2025   Ambulatory referral to Pulmonology    Referral Priority:   Routine    Referral Type:   Consultation    Referral Reason:   Specialty Services Required    Requested Specialty:   Pulmonary Disease    Number of Visits Requested:   1   Follow in 6 months.  All questions were answered. The patient knows to call the clinic with any problems, questions or concerns.  Call Babara, MD, PhD [facility] Hematology Oncology 10/23/2024    Diagnosis- secondary polycythemia likely due to OSA  Chief complaint/ Reason for visit- evaluate need for phlebotomy  Heme/Onc history: Patient is a 73 year old male with a history of secondary polycythemia since 2005.  BCR able and Jak 2 mutation testing was negative in 2011.  He does have a history of obstructive sleep apnea and uses CPAP.  He used to follow up with Dr. Rudell.  He was initially getting phlebotomy every 2 weeks but now gets it every 2 months if need be.  His threshold for phlebotomy was  a hematocrit of greater than 47.  patient previously follows up with Dr. Rudell. He switches care to me on 01/15/2019.  06/15/2016 JAK2 V617F negative, Exons 12-15 mutation  negative.  06/15/2017 Erythropoietin  12.8.  05/15/2019 further testing including MPL, carl mutation which both are negative  On Phlebotomy PRN if Hct >52 now.   Interval history-  73 y.o. male with a history of erythrocytosis present to follow-up.  Patient has no new complaints today. Patient uses his CPAP machine every day.  Denies any shortness of breath or chest pain  Review of systems- Review of Systems  Constitutional:  Negative for chills, fever, malaise/fatigue and weight loss.  HENT:  Negative for sore throat.   Eyes:  Negative for redness.  Respiratory:  Negative for cough, shortness of breath and wheezing.   Cardiovascular:  Negative for chest pain, palpitations and leg swelling.  Gastrointestinal:  Negative for abdominal pain, blood in stool, nausea and vomiting.  Genitourinary:  Negative for dysuria.  Musculoskeletal:  Negative for myalgias.  Skin:  Negative for rash.  Neurological:  Negative for dizziness, tingling and tremors.  Endo/Heme/Allergies:  Does not bruise/bleed easily.  Psychiatric/Behavioral:  Negative for hallucinations.        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   Per  [facility] Flow w Pharm order   Polycythemia    Sleep apnea    USES CPAP     Past Surgical History:  Procedure Laterality Date   BLADDER STONE REMOVAL     cataracts Bilateral    COLONOSCOPY N/A 06/02/2015   Procedure: COLONOSCOPY;  Surgeon: Donnice Vaughn Manes, MD;  Location: [facility] ENDOSCOPY;  Service: Endoscopy;  Laterality: N/A;   CYSTOSCOPY W/ RETROGRADES Left 11/01/2017   Procedure: CYSTOSCOPY WITH RETROGRADE PYELOGRAM;  Surgeon: Twylla Glendia BROCKS, MD;   Location: [facility] ORS;  Service: Urology;  Laterality: Left;   CYSTOSCOPY WITH STENT PLACEMENT Left 11/01/2017   Procedure: CYSTOSCOPY WITH STENT PLACEMENT;  Surgeon: Twylla Glendia BROCKS, MD;  Location: [facility] ORS;  Service: Urology;  Laterality: Left;    Social History   Socioeconomic History   Marital status: Married    Spouse name: Not on file   Number of children: Not on file   Years of education: Not on file   Highest education level: Not on file  Occupational History   Not on file  Tobacco Use   Smoking status: Never   Smokeless tobacco: Never  Vaping Use   Vaping status: Never Used  Substance and Sexual Activity   Alcohol use: No   Drug use: No   Sexual activity: Not on file  Other Topics Concern   Not on file  Social History Narrative   Not on file   Social Drivers of Health   Financial Resource Strain: Patient Declined (05/22/2024)   Received from [facility]   Overall Financial Resource Strain (CARDIA)    Difficulty of Paying Living Expenses: Patient declined  Food Insecurity: Patient Declined (05/22/2024)   Received from [facility]   Hunger Vital Sign    Within the past 12 months, you worried that your food would run out before you got the money to buy more.: Patient declined    Within the past 12 months, the food you bought just didn't last and you didn't have money to get more.: Patient declined  Transportation Needs: Patient Declined (05/22/2024)   Received from [facility] - Transportation    In the past 12 months, has lack of transportation kept you from medical appointments or from getting medications?: Patient declined    Lack of Transportation (Non-Medical): Patient declined  Physical Activity: Not on file  Stress: Not on file  Social Connections: Not on file  Intimate Partner Violence: Not on file    Family History  Problem Relation Age of Onset   Heart disease Mother    Diabetes Father       Current Outpatient Medications:    glipiZIDE (GLUCOTROL XL) 10 MG 24 hr tablet, Take 1 tablet by mouth 2 (two) times daily., Disp: , Rfl:    HYDROcodone -acetaminophen  (NORCO/VICODIN) 5-325 MG tablet, Take 1 tablet by mouth 2 (two) times daily as needed for moderate pain. Reported on 06/15/2016 (Patient taking differently: Take 1 tablet by mouth every 6 (six) hours as needed for moderate pain (pain score 4-6).), Disp: 30 tablet, Rfl: 0   insulin  NPH Human (NOVOLIN N) 100 UNIT/ML injection, Inject 25 Units into the skin at bedtime. 25 units every evening, Disp: , Rfl:    lisinopril  (ZESTRIL ) 2.5 MG tablet, Take 1 tablet by mouth daily., Disp: , Rfl:    LORazepam  (ATIVAN ) 1 MG tablet, Take 1 tablet by mouth 2 (two) times daily as needed., Disp: , Rfl:  metFORMIN (GLUCOPHAGE-XR) 500 MG 24 hr tablet, Take 2 tablets by mouth 2 (two) times daily., Disp: , Rfl:    pioglitazone  (ACTOS ) 15 MG tablet, Take 15 mg by mouth daily., Disp: , Rfl:    pravastatin  (PRAVACHOL ) 10 MG tablet, Take 10 mg by mouth at bedtime. , Disp: , Rfl:    Semaglutide,0.25 or 0.5MG /DOS, 2 MG/1.5ML SOPN, Inject 0.25 mg into the skin every 7 (seven) days. , Disp: , Rfl:    tadalafil  (CIALIS ) 5 MG tablet, TAKE ONE TABLET BY MOUTH DAILY, Disp: 90 tablet, Rfl: 0  Current Facility-Administered Medications:    lidocaine  (XYLOCAINE ) 2 % jelly 1 application, 1 application , Urethral, Once, Stoioff, Scott C, MD  Physical exam: ECOG 1 Vitals:   10/23/24 1431 10/23/24 1440  BP: (!) 144/76 125/77  Pulse: (!) 107   Resp: 18   Temp: (!) 97.2 F (36.2 C)   TempSrc: Tympanic   SpO2: 94%   Weight: 245 lb (111.1 kg)    Physical Exam Constitutional:      General: He is not in acute distress.    Appearance: He is obese.  HENT:     Head: Normocephalic and atraumatic.  Eyes:     General: No scleral icterus. Cardiovascular:     Rate and Rhythm: Normal rate.  Pulmonary:     Effort: Pulmonary effort is normal. No respiratory  distress.     Breath sounds: No wheezing.  Abdominal:     General: There is no distension.  Musculoskeletal:        General: No deformity. Normal range of motion.     Cervical back: Normal range of motion and neck supple.  Skin:    General: Skin is warm.     Findings: No rash.  Neurological:     Mental Status: He is alert and oriented to person, place, and time. Mental status is at baseline.     Cranial Nerves: No cranial nerve deficit.  Psychiatric:        Mood and Affect: Mood normal.         Latest Ref Rng & Units 10/26/2022    9:38 AM  CMP  Glucose 70 - 99 mg/dL 814   BUN 8 - 23 mg/dL 18   Creatinine 9.38 - 1.24 mg/dL 9.04   Sodium 864 - 854 mmol/L 135   Potassium 3.5 - 5.1 mmol/L 4.4   Chloride 98 - 111 mmol/L 102   CO2 22 - 32 mmol/L 24   Calcium 8.9 - 10.3 mg/dL 9.3   Total Protein 6.5 - 8.1 g/dL 7.6   Total Bilirubin 0.3 - 1.2 mg/dL 0.9   Alkaline Phos 38 - 126 U/L 60   AST 15 - 41 U/L 19   ALT 0 - 44 U/L 15       Latest Ref Rng & Units 10/23/2024    2:01 PM  CBC  WBC 4.0 - 10.5 K/uL 10.4   Hemoglobin 13.0 - 17.0 g/dL 81.0   Hematocrit 60.9 - 52.0 % 54.4   Platelets 150 - 400 K/uL 215    RADIOGRAPHIC STUDIES: I have personally reviewed the radiological images as listed and agreed with the findings in the report. No results found.

## 2024-10-29 ENCOUNTER — Ambulatory Visit (INDEPENDENT_AMBULATORY_CARE_PROVIDER_SITE_OTHER): Admitting: Sleep Medicine

## 2024-10-29 ENCOUNTER — Encounter: Payer: Self-pay | Admitting: Sleep Medicine

## 2024-10-29 VITALS — BP 110/60 | HR 100 | Temp 97.8°F | Ht 68.0 in | Wt 246.7 lb

## 2024-10-29 DIAGNOSIS — I10 Essential (primary) hypertension: Secondary | ICD-10-CM | POA: Diagnosis not present

## 2024-10-29 DIAGNOSIS — G4733 Obstructive sleep apnea (adult) (pediatric): Secondary | ICD-10-CM | POA: Diagnosis not present

## 2024-10-29 NOTE — Patient Instructions (Signed)
 Will complete in lab study and follow up to review results.

## 2024-10-29 NOTE — Progress Notes (Signed)
 Name:Benjamin [name] MRN: [id number] DOB: 04-22-1951   CHIEF COMPLAINT:  EXCESSIVE DAYTIME SLEEPINESS   HISTORY OF PRESENT ILLNESS: Benjamin [name] is a 73 y.o. w/ a h/o OSA, HTN, polycythemia vera, DMII and obesity who presents to establish care for OSA. Reports that he was initially diagnosed with severe OSA around 15 years ago and subsequently started on CPAP therapy. Reports using CPAP therapy every night, which is confirmed by compliance data. He is currently using a nasal mask. Reports feeling unrefreshed upon awakening on most days. Reports nocturnal awakenings due to nocturia, however does not have difficulty falling back to sleep. Denies any significant weight changes. Denies morning headaches, RLS symptoms, dream enactment, cataplexy, hypnagogic or hypnapompic hallucinations. Denies a family history of sleep apnea. Denies drowsy driving. Drinks 1-2 sodas daily, denies alcohol, tobacco or illicit drug use.   Bedtime 10 pm Sleep onset 15 mins Rise time 6-8 am   EPWORTH SLEEP SCORE 7    10/29/2024    2:00 PM  Results of the Epworth flowsheet  Sitting and reading 0  Watching TV 2  Sitting, inactive in a public place (e.g. 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  has a past surgical history that includes cataracts (Bilateral); Colonoscopy (N/A, 06/02/2015); Bladder stone removal; Cystoscopy with stent placement (Left,  11/01/2017); and Cystoscopy w/ retrogrades (Left, 11/01/2017). Prior to Admission medications   Medication Sig Start Date End Date Taking? Authorizing Provider  glipiZIDE (GLUCOTROL XL) 10 MG 24 hr tablet Take 1 tablet by mouth 2 (two) times daily. 08/05/20  Yes [provider]  HYDROcodone -acetaminophen  (NORCO/VICODIN) 5-325 MG tablet Take 1 tablet by mouth 2 (two) times daily as needed for moderate pain. Reported on 06/15/2016 Patient taking differently: Take 1 tablet by mouth every 6 (six) hours as needed for moderate pain (pain score 4-6). 10/24/17  Yes McGowan, Clotilda A, PA-C  insulin  NPH Human (NOVOLIN N) 100 UNIT/ML injection Inject 25 Units into the skin at bedtime. 25 units every evening 02/12/19  Yes [provider]  lisinopril  (ZESTRIL ) 2.5 MG tablet Take 1 tablet by mouth daily. 12/06/23  Yes [provider]  LORazepam  (ATIVAN ) 1 MG tablet Take 1 tablet by mouth 2 (two) times daily as needed. 11/28/23  Yes [provider]  metFORMIN (GLUCOPHAGE-XR) 500 MG 24 hr tablet Take 2 tablets by mouth 2 (two) times daily. 04/03/24  Yes [provider]  ONETOUCH ULTRA test strip 1 each daily.   Yes [provider]  pioglitazone  (ACTOS ) 15 MG tablet Take 15 mg by mouth daily. 02/01/21  Yes [provider]  pravastatin  (PRAVACHOL ) 40 MG tablet Take 40 mg by mouth daily.   Yes [provider]  Semaglutide,0.25 or 0.5MG /DOS, 2 MG/1.5ML SOPN Inject 0.25 mg into the skin every 7 (seven) days.  05/15/19  Yes [provider]  tadalafil  (CIALIS ) 5 MG tablet TAKE ONE TABLET BY MOUTH DAILY 02/16/21  Yes Stoioff, Glendia BROCKS, MD   Allergies  Allergen Reactions   Sertraline Hcl Other (See Comments)    Vivid dreams    FAMILY HISTORY:  family history includes Diabetes in his father; Heart disease in his mother. SOCIAL HISTORY:  reports that he has never smoked. He has never used smokeless tobacco. He reports that he does not drink alcohol  and does not use drugs.   Review of Systems:  Gen:  Denies  fever, sweats, chills weight loss  HEENT: Denies blurred vision, double vision, ear pain, eye pain, hearing loss, nose bleeds, sore throat Cardiac:  No dizziness, chest pain or heaviness, chest tightness,edema, No JVD Resp:   No cough, -sputum production, -shortness of breath,-wheezing, -hemoptysis,  Gi: Denies swallowing difficulty, stomach pain, nausea or vomiting, diarrhea, constipation, bowel incontinence Gu:  Denies bladder incontinence, burning urine Ext:   Denies Joint pain, stiffness or swelling Skin: Denies  skin rash, easy bruising or bleeding or hives Endoc:  Denies polyuria, polydipsia , polyphagia or weight change Psych:   Denies depression, insomnia or hallucinations  Other:  All other systems negative  VITAL SIGNS: BP 110/60   Pulse 100   Temp 97.8 F (36.6 C)   Ht 5' 8 (1.727 m)   Wt 246 lb 11.2 oz (111.9 kg)   SpO2 94%   BMI 37.51 kg/m    Physical Examination:   General Appearance: No distress  EYES PERRLA, EOM intact.   NECK Supple, No JVD Pulmonary: normal breath sounds, No wheezing.  CardiovascularNormal S1,S2.  No m/r/g.   Abdomen: Benign, Soft, non-tender. Skin:   warm, no rashes, no ecchymosis  Extremities: normal, no cyanosis, clubbing. Neuro:without focal findings,  speech normal  PSYCHIATRIC: Mood, affect within normal limits.   ASSESSMENT AND PLAN  OSA Patient is using and benefiting from CPAP therapy. Despite excellent CPAP usage and compliance, he still feels unrefreshed. Will complete a CPAP titration study for further evaluation. Discussed the consequences of untreated sleep apnea. Advised not to drive drowsy for safety of patient and others. Will complete further evaluation with a home sleep study and follow up to review results.    HTN Stable, on current management. Following with PCP.    MEDICATION ADJUSTMENTS/LABS AND TESTS ORDERED: Recommend Sleep Study   Patient   satisfied with Plan of action and management. All questions answered  Follow up to review CPAP titration study results and treatment plan.   I spent a total of 33 minutes reviewing chart data, face-to-face evaluation with the patient, counseling and coordination of care as detailed above.    Aminat Shelburne, M.D.  Sleep Medicine [facility]

## 2024-11-13 ENCOUNTER — Telehealth: Payer: Self-pay

## 2024-11-13 DIAGNOSIS — G4733 Obstructive sleep apnea (adult) (pediatric): Secondary | ICD-10-CM

## 2024-11-13 NOTE — Telephone Encounter (Signed)
 Copied from CRM #[id number]. Topic: Referral - Status >> Nov 02, 2024  9:24 AM [name] wrote: Reason for CRM: Patient [phone] states saw Dr. Jess and needs a sleep study test to be done across the street and has not heard from anyone. Please advise and call back. >> Nov 13, 2024 10:22 AM [name] wrote: Pt is calling back about the CPAP Titration  order for his sleep study but stated no one has called him to schedule or even contacted him from the call he made on 11/07

## 2024-11-13 NOTE — Telephone Encounter (Signed)
 I called Mr. Campton and tried to explain that I needed a copy of his original sleep study. He stated it was done by [facility] and Dr. Valora placed the order. I have called [facility] asking for a copy of the original sleep study per Dorthea she would fax it to me. They were done 04/2013 and 05/2013

## 2024-11-20 NOTE — Telephone Encounter (Signed)
 I spoke with Benjamin [name] with [facility] she states the patient can do split night

## 2024-11-20 NOTE — Telephone Encounter (Signed)
 Copied from CRM #[id number]. Topic: Referral - Question >> Nov 20, 2024  1:18 PM Ismael A wrote: Reason for CRM: Millard from [facility] ph# [phone]- states they received a cpap referral and past sleep study report however from 2014, they need a recent sleep study within the last 5 years at least

## 2024-11-20 NOTE — Telephone Encounter (Signed)
 Sticky note reminder placed on provider's desk.

## 2024-11-26 NOTE — Telephone Encounter (Signed)
Split night sleep study order placed.  

## 2024-11-26 NOTE — Addendum Note (Signed)
 Addended by: Shadai Mcclane J on: 11/26/2024 04:56 PM   Modules accepted: Orders

## 2024-11-26 NOTE — Telephone Encounter (Signed)
 Pt aware of new sleep study order placed and that he will eventually still need to complete the CPAP titration study after the split night. NFN.

## 2024-11-26 NOTE — Telephone Encounter (Signed)
 Sticky note on provider's desk.

## 2025-01-07 ENCOUNTER — Ambulatory Visit: Attending: Sleep Medicine

## 2025-01-07 DIAGNOSIS — G4733 Obstructive sleep apnea (adult) (pediatric): Secondary | ICD-10-CM | POA: Diagnosis present

## 2025-01-07 DIAGNOSIS — G4761 Periodic limb movement disorder: Secondary | ICD-10-CM | POA: Insufficient documentation

## 2025-01-17 DIAGNOSIS — G4733 Obstructive sleep apnea (adult) (pediatric): Secondary | ICD-10-CM | POA: Diagnosis not present

## 2025-01-23 ENCOUNTER — Ambulatory Visit (INDEPENDENT_AMBULATORY_CARE_PROVIDER_SITE_OTHER): Payer: Self-pay | Admitting: Sleep Medicine

## 2025-01-23 NOTE — Progress Notes (Signed)
 Letter Generated for Pt.

## 2025-04-23 ENCOUNTER — Inpatient Hospital Stay

## 2025-04-23 ENCOUNTER — Inpatient Hospital Stay: Admitting: Oncology
